# Patient Record
Sex: Female | Born: 1950 | Race: White | Hispanic: No | Smoking: Former smoker
Health system: Southern US, Community
[De-identification: ages and names within clinical notes are randomized; demographics above are authoritative.]

## PROBLEM LIST (undated history)

## (undated) ENCOUNTER — Emergency Department: Discharge status: Absent without leave | Payer: Self-pay

## (undated) DIAGNOSIS — M1990 Unspecified osteoarthritis, unspecified site: Secondary | ICD-10-CM

## (undated) DIAGNOSIS — D649 Anemia, unspecified: Secondary | ICD-10-CM

## (undated) DIAGNOSIS — D75839 Thrombocytosis, unspecified: Secondary | ICD-10-CM

## (undated) DIAGNOSIS — Z8719 Personal history of other diseases of the digestive system: Secondary | ICD-10-CM

## (undated) DIAGNOSIS — Z87442 Personal history of urinary calculi: Secondary | ICD-10-CM

## (undated) DIAGNOSIS — E669 Obesity, unspecified: Secondary | ICD-10-CM

## (undated) DIAGNOSIS — K219 Gastro-esophageal reflux disease without esophagitis: Secondary | ICD-10-CM

## (undated) DIAGNOSIS — E559 Vitamin D deficiency, unspecified: Secondary | ICD-10-CM

## (undated) DIAGNOSIS — I10 Essential (primary) hypertension: Secondary | ICD-10-CM

## (undated) DIAGNOSIS — T4145XA Adverse effect of unspecified anesthetic, initial encounter: Secondary | ICD-10-CM

## (undated) DIAGNOSIS — N3281 Overactive bladder: Secondary | ICD-10-CM

## (undated) DIAGNOSIS — D473 Essential (hemorrhagic) thrombocythemia: Secondary | ICD-10-CM

## (undated) DIAGNOSIS — J449 Chronic obstructive pulmonary disease, unspecified: Secondary | ICD-10-CM

## (undated) DIAGNOSIS — R319 Hematuria, unspecified: Secondary | ICD-10-CM

## (undated) DIAGNOSIS — F419 Anxiety disorder, unspecified: Secondary | ICD-10-CM

## (undated) DIAGNOSIS — M810 Age-related osteoporosis without current pathological fracture: Secondary | ICD-10-CM

## (undated) DIAGNOSIS — G4700 Insomnia, unspecified: Secondary | ICD-10-CM

## (undated) DIAGNOSIS — J45909 Unspecified asthma, uncomplicated: Secondary | ICD-10-CM

## (undated) DIAGNOSIS — T8859XA Other complications of anesthesia, initial encounter: Secondary | ICD-10-CM

## (undated) DIAGNOSIS — M797 Fibromyalgia: Secondary | ICD-10-CM

## (undated) DIAGNOSIS — F32A Depression, unspecified: Secondary | ICD-10-CM

## (undated) DIAGNOSIS — H269 Unspecified cataract: Secondary | ICD-10-CM

## (undated) DIAGNOSIS — F329 Major depressive disorder, single episode, unspecified: Secondary | ICD-10-CM

## (undated) DIAGNOSIS — E785 Hyperlipidemia, unspecified: Secondary | ICD-10-CM

## (undated) HISTORY — DX: Essential (primary) hypertension: I10

## (undated) HISTORY — PX: FOOT SURGERY: SHX648

## (undated) HISTORY — PX: SHOULDER SURGERY: SHX246

## (undated) HISTORY — DX: Unspecified cataract: H26.9

## (undated) HISTORY — PX: CHOLECYSTECTOMY: SHX55

## (undated) HISTORY — DX: Overactive bladder: N32.81

## (undated) HISTORY — PX: KNEE SURGERY: SHX244

## (undated) HISTORY — PX: DENTAL SURGERY: SHX609

## (undated) HISTORY — DX: Gastro-esophageal reflux disease without esophagitis: K21.9

## (undated) HISTORY — PX: OTHER SURGICAL HISTORY: SHX169

## (undated) HISTORY — DX: Hematuria, unspecified: R31.9

## (undated) HISTORY — DX: Obesity, unspecified: E66.9

## (undated) HISTORY — DX: Vitamin D deficiency, unspecified: E55.9

## (undated) HISTORY — DX: Thrombocytosis, unspecified: D75.839

---
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
## 2001-12-24 ENCOUNTER — Encounter: Payer: Self-pay | Admitting: Family Medicine

## 2001-12-24 ENCOUNTER — Encounter: Admission: RE | Admit: 2001-12-24 | Discharge: 2001-12-24 | Payer: Self-pay | Admitting: Family Medicine

## 2010-02-02 ENCOUNTER — Emergency Department (HOSPITAL_COMMUNITY): Admission: EM | Admit: 2010-02-02 | Discharge: 2010-02-02 | Payer: Self-pay | Admitting: Emergency Medicine

## 2010-05-02 ENCOUNTER — Encounter: Admission: RE | Admit: 2010-05-02 | Discharge: 2010-05-21 | Payer: Self-pay | Admitting: Orthopedic Surgery

## 2011-04-19 ENCOUNTER — Inpatient Hospital Stay (INDEPENDENT_AMBULATORY_CARE_PROVIDER_SITE_OTHER)
Admission: RE | Admit: 2011-04-19 | Discharge: 2011-04-19 | Disposition: A | Discharge status: Home / Self Care | Payer: Medicare Other | Source: Ambulatory Visit | Attending: Family Medicine | Admitting: Family Medicine

## 2011-04-19 ENCOUNTER — Encounter: Payer: Self-pay | Admitting: Family Medicine

## 2011-04-19 DIAGNOSIS — B86 Scabies: Secondary | ICD-10-CM | POA: Insufficient documentation

## 2011-04-19 DIAGNOSIS — F418 Other specified anxiety disorders: Secondary | ICD-10-CM | POA: Insufficient documentation

## 2011-05-16 ENCOUNTER — Telehealth (INDEPENDENT_AMBULATORY_CARE_PROVIDER_SITE_OTHER): Payer: Self-pay | Admitting: *Deleted

## 2011-05-16 ENCOUNTER — Inpatient Hospital Stay: Admission: RE | Admit: 2011-05-16 | Payer: Medicare Other | Source: Ambulatory Visit | Admitting: Family Medicine

## 2011-07-21 NOTE — Telephone Encounter (Signed)
  Phone Note Call from Patient   Summary of Call: pt came by the clinic today c/o rash again. explained to the pt per dr Cathren Harsh that she needs to follow up with dermatologist. sch'ed an appt with dr Terri Piedra for 05/19/11 @ 12N. pt notified of the appt. advised her if no improvement after visit with dr Terri Piedra dr Cathren Harsh reccomends that she see dermatology @ [facility]. given map and phone number to [facility] derm. Initial call taken by: Clemens Catholic LPN,  May 16, 2011 12:24 PM

## 2011-07-21 NOTE — Progress Notes (Signed)
Summary: SCABIES (room 2)   Vital Signs:  Patient Profile:   60 Years Old Female CC:      scabies Height:     61 inches (154.94 cm) Weight:      172 pounds (78.18 kg) O2 Sat:      95 % O2 treatment:    Room Air Temp:     98.4 degrees F (36.89 degrees C) oral Pulse rate:   74 / minute Resp:     16 per minute BP sitting:   127 / 83  (left arm) Cuff size:   regular  Pt. in pain?   no  Vitals Entered By: Lavell Islam RN (April 19, 2011 12:32 PM)                   Current Allergies: No known allergies History of Present Illness Chief Complaint: scabies History of Present Illness:  Subjective:  Patient complains of recurrent scabies.  Her sister has similar symptoms, although they do not live together.  She was recently treated with a partial dose of Ivermectin, and symptoms improved but recurred.  REVIEW OF SYSTEMS Constitutional Symptoms      Denies fever, chills, night sweats, weight loss, weight gain, and fatigue.  Eyes       Denies change in vision, eye pain, eye discharge, glasses, contact lenses, and eye surgery. Ear/Nose/Throat/Mouth       Denies hearing loss/aids, change in hearing, ear pain, ear discharge, dizziness, frequent runny nose, frequent nose bleeds, sinus problems, sore throat, hoarseness, and tooth pain or bleeding.  Respiratory       Denies dry cough, productive cough, wheezing, shortness of breath, asthma, bronchitis, and emphysema/COPD.  Cardiovascular       Denies murmurs, chest pain, and tires easily with exhertion.    Gastrointestinal       Denies stomach pain, nausea/vomiting, diarrhea, constipation, blood in bowel movements, and indigestion. Genitourniary       Denies painful urination, kidney stones, and loss of urinary control. Neurological       Denies paralysis, seizures, and fainting/blackouts. Musculoskeletal       Denies muscle pain, joint pain, joint stiffness, decreased range of motion, redness, swelling, muscle weakness, and  gout.  Skin       Denies bruising, unusual mles/lumps or sores, and hair/skin or nail changes.  Psych       Denies mood changes, temper/anger issues, anxiety/stress, speech problems, depression, and sleep problems. Other Comments: Scabies   Past History:  Past Medical History: Anxiety  Past Surgical History: Caesarean section x 2 Knee surgeries x 2 Shoulder surgeries x 2 foot surgery  Family History: Family History Diabetes 1st degree relative Family History Hypertension  Social History: Never Smoked Alcohol use-no Drug use-no Smoking Status:  never Drug Use:  no   Objective:  Appearance:  Patient appears healthy, stated age, and in no acute distress  Skin:  scattered maculo-papular erythematous excoriations on trunk and extremities.  Lesions do not appear infected. Assessment New Problems: SCABIES (ICD-133.0) FAMILY HISTORY DIABETES 1ST DEGREE RELATIVE (ICD-V18.0) ANXIETY (ICD-300.00)   Plan New Medications/Changes: STROMECTOL 3 MG TABS (IVERMECTIN) Take 6 tabs by mouth as a single dose.  Take on empty stomach with plenty of water.  Repeat in two weeks  #12 x 0, 04/19/2011, Donna Christen MD  New Orders: New Patient Level III [phone] Services provided After hours-Weekends-Holidays [99051] Planning Comments:   Treat with Ivermectin as a single dose.  Repeat in 2 weeks.  May  continue triamcinolone cream to individual lesions.  Wash all clothes and bedsheets.  Will treat sister also.  Follow-up with dermatologist.   The patient and/or caregiver has been counseled thoroughly with regard to medications prescribed including dosage, schedule, interactions, rationale for use, and possible side effects and they verbalize understanding.  Diagnoses and expected course of recovery discussed and will return if not improved as expected or if the condition worsens. Patient and/or caregiver verbalized understanding.  Prescriptions: STROMECTOL 3 MG TABS (IVERMECTIN) Take 6 tabs  by mouth as a single dose.  Take on empty stomach with plenty of water.  Repeat in two weeks  #12 x 0   Entered and Authorized by:   Donna Christen MD   Signed by:   Donna Christen MD on 04/19/2011   Method used:   Print then Give to Patient   RxID:   [id number]   Orders Added: 1)  New Patient Level III [14782] 2)  Services provided After hours-Weekends-Holidays [95621]

## 2013-02-14 ENCOUNTER — Encounter: Payer: Self-pay | Admitting: Emergency Medicine

## 2013-02-14 ENCOUNTER — Emergency Department (INDEPENDENT_AMBULATORY_CARE_PROVIDER_SITE_OTHER)
Admission: EM | Admit: 2013-02-14 | Discharge: 2013-02-14 | Disposition: A | Discharge status: Home / Self Care | Payer: Medicare Other | Source: Home / Self Care | Attending: Family Medicine | Admitting: Family Medicine

## 2013-02-14 DIAGNOSIS — R319 Hematuria, unspecified: Secondary | ICD-10-CM

## 2013-02-14 DIAGNOSIS — R300 Dysuria: Secondary | ICD-10-CM

## 2013-02-14 DIAGNOSIS — L259 Unspecified contact dermatitis, unspecified cause: Secondary | ICD-10-CM

## 2013-02-14 LAB — POCT URINALYSIS DIPSTICK
Bilirubin, UA: NEGATIVE
Glucose, UA: NEGATIVE
Ketones, UA: NEGATIVE
Leukocytes, UA: NEGATIVE
Nitrite, UA: NEGATIVE
Protein, UA: NEGATIVE
Spec Grav, UA: 1.015 (ref 1.005–1.03)
Urobilinogen, UA: 0.2 (ref 0–1)
pH, UA: 6 (ref 5–8)

## 2013-02-14 MED ORDER — TRIAMCINOLONE ACETONIDE 40 MG/ML IJ SUSP
40.0000 mg | Freq: Once | INTRAMUSCULAR | Status: AC
  Administered 2013-02-14: 40 mg via INTRAMUSCULAR

## 2013-02-14 NOTE — ED Notes (Signed)
Red itchy rash on abdomen, hips, rt leg x 1 week

## 2013-02-14 NOTE — Discharge Instructions (Signed)
Take Benadryl 25mg , two caps at bedtime for itching.   Hematuria, Adult Hematuria (blood in your urine) can be caused by a bladder infection (cystitis), kidney infection (pyelonephritis), prostate infection (prostatitis), or kidney stone. Infections will usually respond to antibiotics (medications which kill germs), and a kidney stone will usually pass through your urine without further treatment. If you were put on antibiotics, take all the medicine until gone. You may feel better in a few days, but take all of your medicine or the infection may not respond and become more difficult to treat. If antibiotics were not given, an infection did not cause the blood in the urine. A further work up to find out the reason may be needed. HOME CARE INSTRUCTIONS   Drink lots of fluid, 3 to 4 quarts a day. If you have been diagnosed with an infection, cranberry juice is especially recommended, in addition to large amounts of water.  Avoid caffeine, tea, and carbonated beverages, because they tend to irritate the bladder.  Avoid alcohol as it may irritate the prostate.  Only take over-the-counter or prescription medicines for pain, discomfort, or fever as directed by your caregiver.  If you have been diagnosed with a kidney stone follow your caregivers instructions regarding straining your urine to catch the stone. TO PREVENT FURTHER INFECTIONS:  Empty the bladder often. Avoid holding urine for long periods of time.  After a bowel movement, women should cleanse front to back. Use each tissue only once.  Empty the bladder before and after sexual intercourse if you are a female.  Return to your caregiver if you develop back pain, fever, nausea (feeling sick to your stomach), vomiting, or your symptoms (problems) are not better in 3 days. Return sooner if you are getting worse. If you have been requested to return for further testing make sure to keep your appointments. If an infection is not the cause of  blood in your urine, X-rays may be required. Your caregiver will discuss this with you. SEEK IMMEDIATE MEDICAL CARE IF:   You have a persistent fever over 102 F (38.9 C).  You develop severe vomiting and are unable to keep the medication down.  You develop severe back or abdominal pain despite taking your medications.  You begin passing a large amount of blood or clots in your urine.  You feel extremely weak or faint, or pass out. MAKE SURE YOU:   Understand these instructions.  Will watch your condition.  Will get help right away if you are not doing well or get worse. Document Released: 08/04/2005 Document Revised: 10/27/2011 Document Reviewed: 03/23/2008 [facility] Patient Information 2014 [place], [place].

## 2013-02-14 NOTE — ED Provider Notes (Signed)
History    CSN: [id number] Arrival date & time 02/14/13  1029  First MD Initiated Contact with Patient 02/14/13 1102     Chief Complaint  Patient presents with  . Rash      HPI Comments: Patient has been mowing in her yard and has contacted poison oak.  She has scattered rash on trunk and extremities.  Itching is worse at night. She complains of a history of hematuria.  She denies dysuria, frequency, or urgency.  She states that she has a history of kidney stones.  No abdominal or flank pain.  No fevers, chills, and sweats. She states that she has a history of high cholesterol, but has not taken medication for several months.   Patient is a 62 y.o. female presenting with rash. The history is provided by the patient.  Rash Pain location: trunk and extremities. Pain quality comment:  Itching Pain severity:  Mild Onset quality:  Gradual Duration:  1 week Timing:  Constant Progression:  Unchanged Chronicity:  New Context comment:  Working outside Relieved by:  Nothing Exacerbated by: sweating. Ineffective treatments: calamine lotion. Associated symptoms: hematuria   Associated symptoms: no chills, no dysuria, no fever and no nausea    History reviewed. No pertinent past medical history. History reviewed. No pertinent past surgical history. No family history on file. History  Substance Use Topics  . Smoking status: Current Some Day Smoker -- 40 years    Types: Cigarettes  . Smokeless tobacco: Not on file  . Alcohol Use: No   OB History   Grav Para Term Preterm Abortions TAB SAB Ect Mult Living                 Review of Systems  Constitutional: Negative for fever and chills.  Gastrointestinal: Negative for nausea.  Genitourinary: Positive for hematuria. Negative for dysuria.  Skin: Positive for rash.  All other systems reviewed and are negative.    Allergies  Review of patient's allergies indicates no known allergies.  Home Medications   Current Outpatient Rx    Name  Route  Sig  Dispense  Refill  . sertraline (ZOLOFT) 100 MG tablet   Oral   Take 100 mg by mouth daily.         Marland Kitchen zolpidem (AMBIEN) 10 MG tablet   Oral   Take 10 mg by mouth at bedtime as needed for sleep.          BP 152/80  Pulse 73  Temp(Src) 97.7 F (36.5 C) (Oral)  Ht 5\' 1"  (1.549 m)  Wt 186 lb (84.369 kg)  BMI 35.16 kg/m2  SpO2 96% Physical Exam  Nursing note and vitals reviewed. Constitutional: She is oriented to person, place, and time. She appears well-developed and well-nourished. No distress.  Patient is obese (BMI 35.2)  HENT:  Head: Normocephalic.  Eyes: Conjunctivae are normal. Pupils are equal, round, and reactive to light.  Cardiovascular: Normal heart sounds.   Pulmonary/Chest: Breath sounds normal.  Abdominal: There is no tenderness.  Musculoskeletal: She exhibits no edema.  Neurological: She is alert and oriented to person, place, and time.  Skin: Skin is warm and dry. Rash noted.     Skin reveals widely scattered areas of patchy erythema.  No vesicles or swelling/tenderness.    ED Course  Procedures  none  Labs Reviewed  URINE CULTURE pending  POCT URINALYSIS DIPSTICK:  BLO moderate, otherwise negative      1. Dysuria   2. Contact dermatitis  3. Hematuria     MDM  Kenalog 40mg  IM Take Benadryl 25mg , two caps at bedtime for itching. Will culture urine.  Followup with Family Doctor for hematuria, hyperlipidemia, etc.   Lattie Haw, MD 02/14/13 1149

## 2013-02-15 LAB — URINE CULTURE
Colony Count: NO GROWTH
Organism ID, Bacteria: NO GROWTH

## 2013-02-16 ENCOUNTER — Telehealth: Payer: Self-pay

## 2013-02-16 NOTE — ED Notes (Signed)
Larry states she is ok. I advised her of the urine culture and to call back if needed.

## 2013-09-28 ENCOUNTER — Emergency Department (INDEPENDENT_AMBULATORY_CARE_PROVIDER_SITE_OTHER): Payer: Medicare Other

## 2013-09-28 ENCOUNTER — Emergency Department
Admission: EM | Admit: 2013-09-28 | Discharge: 2013-09-28 | Disposition: A | Discharge status: Home / Self Care | Payer: Medicare Other | Source: Home / Self Care | Attending: Family Medicine | Admitting: Family Medicine

## 2013-09-28 ENCOUNTER — Emergency Department: Payer: Medicare Other

## 2013-09-28 ENCOUNTER — Encounter: Payer: Self-pay | Admitting: Emergency Medicine

## 2013-09-28 DIAGNOSIS — R609 Edema, unspecified: Secondary | ICD-10-CM

## 2013-09-28 DIAGNOSIS — K120 Recurrent oral aphthae: Secondary | ICD-10-CM

## 2013-09-28 DIAGNOSIS — M7989 Other specified soft tissue disorders: Secondary | ICD-10-CM

## 2013-09-28 HISTORY — DX: Major depressive disorder, single episode, unspecified: F32.9

## 2013-09-28 HISTORY — DX: Depression, unspecified: F32.A

## 2013-09-28 HISTORY — DX: Insomnia, unspecified: G47.00

## 2013-09-28 HISTORY — DX: Fibromyalgia: M79.7

## 2013-09-28 MED ORDER — CYCLOBENZAPRINE HCL 5 MG PO TABS: ORAL_TABLET | ORAL | Status: DC

## 2013-09-28 NOTE — Discharge Instructions (Signed)
Wear support hose daytime; take off at bedtime.   Edema Edema is an abnormal build-up of fluids in tissues. Because this is partly dependent on gravity (water flows to the lowest place), it is more common in the legs and thighs (lower extremities). It is also common in the looser tissues, like around the eyes. Painless swelling of the feet and ankles is common and increases as a person ages. It may affect both legs and may include the calves or even thighs. When squeezed, the fluid may move out of the affected area and may leave a dent for a few moments. CAUSES   Prolonged standing or sitting in one place for extended periods of time. Movement helps pump tissue fluid into the veins, and absence of movement prevents this, resulting in edema.  Varicose veins. The valves in the veins do not work as well as they should. This causes fluid to leak into the tissues.  Fluid and salt overload.  Injury, burn, or surgery to the leg, ankle, or foot, may damage veins and allow fluid to leak out.  Sunburn damages vessels. Leaky vessels allow fluid to go out into the sunburned tissues.  Allergies (from insect bites or stings, medications or chemicals) cause swelling by allowing vessels to become leaky.  Protein in the blood helps keep fluid in your vessels. Low protein, as in malnutrition, allows fluid to leak out.  Hormonal changes, including pregnancy and menstruation, cause fluid retention. This fluid may leak out of vessels and cause edema.  Medications that cause fluid retention. Examples are sex hormones, blood pressure medications, steroid treatment, or anti-depressants.  Some illnesses cause edema, especially heart failure, kidney disease, or liver disease.  Surgery that cuts veins or lymph nodes, such as surgery done for the heart or for breast cancer, may result in edema. DIAGNOSIS  Your caregiver is usually easily able to determine what is causing your swelling (edema) by simply asking what  is wrong (getting a history) and examining you (doing a physical). Sometimes x-rays, EKG (electrocardiogram or heart tracing), and blood work may be done to evaluate for underlying medical illness. TREATMENT  General treatment includes:  Leg elevation (or elevation of the affected body part).  Restriction of fluid intake.  Prevention of fluid overload.  Compression of the affected body part. Compression with elastic bandages or support stockings squeezes the tissues, preventing fluid from entering and forcing it back into the blood vessels.  Diuretics (also called water pills or fluid pills) pull fluid out of your body in the form of increased urination. These are effective in reducing the swelling, but can have side effects and must be used only under your caregiver's supervision. Diuretics are appropriate only for some types of edema. The specific treatment can be directed at any underlying causes discovered. Heart, liver, or kidney disease should be treated appropriately. HOME CARE INSTRUCTIONS   Elevate the legs (or affected body part) above the level of the heart, while lying down.  Avoid sitting or standing still for prolonged periods of time.  Avoid putting anything directly under the knees when lying down, and do not wear constricting clothing or garters on the upper legs.  Exercising the legs causes the fluid to work back into the veins and lymphatic channels. This may help the swelling go down.  The pressure applied by elastic bandages or support stockings can help reduce ankle swelling.  A low-salt diet may help reduce fluid retention and decrease the ankle swelling.  Take any medications exactly  as prescribed. SEEK MEDICAL CARE IF:  Your edema is not responding to recommended treatments. SEEK IMMEDIATE MEDICAL CARE IF:   You develop shortness of breath or chest pain.  You cannot breathe when you lay down; or if, while lying down, you have to get up and go to the window  to get your breath.  You are having increasing swelling without relief from treatment.  You develop a fever over 102 F (38.9 C).  You develop pain or redness in the areas that are swollen.  Tell your caregiver right away if you have gained 03 lb/1.4 kg in 1 day or 05 lb/2.3 kg in a week. MAKE SURE YOU:   Understand these instructions.  Will watch your condition.  Will get help right away if you are not doing well or get worse. Document Released: 08/04/2005 Document Revised: 02/03/2012 Document Reviewed: 03/22/2008 [facility] Patient Information 2014 [place].

## 2013-09-28 NOTE — ED Notes (Signed)
Pitting edema left ankle x 3 weeks worse in the last week, painful to the touch

## 2013-09-28 NOTE — ED Provider Notes (Signed)
CSN: [id number]     Arrival date & time 09/28/13  1101 History   First MD Initiated Contact with Patient 09/28/13 1121     Chief Complaint  Patient presents with  . Leg Swelling        HPI Comments: Patient presents with 2 problems: 1)  She complains of pain in her left lower leg above the ankle for 3 weeks.  No history of injury.  She has had mild bilateral lower leg edema that is worse in the evenings and slightly worse upon arising each morning.  The left leg is more pronounced than the right.  No chest pain or shortness of breath.  No fevers, chills, and sweats  2)  She noticed swelling and pain in her right tongue about two weeks ago that is gradually improving.  Patient is a 63 y.o. female presenting with leg pain. The history is provided by the patient.  Leg Pain Location:  Leg Time since incident:  3 weeks Injury: no   Leg location:  L leg Pain details:    Quality:  Aching   Radiates to:  Does not radiate   Severity:  Mild   Onset quality:  Gradual   Duration:  3 weeks   Timing:  Constant   Progression:  Worsening Chronicity:  New Prior injury to area:  No Relieved by:  Nothing Worsened by:  Bearing weight Ineffective treatments:  None tried Associated symptoms: swelling   Associated symptoms: no decreased ROM, no fatigue, no fever, no itching, no muscle weakness, no numbness, no stiffness and no tingling   Risk factors: obesity     Past Medical History  Diagnosis Date  . Depression   . Insomnia   . Fibromyalgia    Past Surgical History  Procedure Laterality Date  . Knee surgery    . C sections    . Cholecystectomy    . Foot surgery    . Shoulder surgery     Family History  Problem Relation Age of Onset  . Hyperlipidemia Mother   . Hypertension Mother   . Heart attack Mother   . Cancer Father    History  Substance Use Topics  . Smoking status: Current Some Day Smoker -- 40 years    Types: Cigarettes  . Smokeless tobacco: Not on file  . Alcohol  Use: No   OB History   Grav Para Term Preterm Abortions TAB SAB Ect Mult Living                 Review of Systems  Constitutional: Negative for fever and fatigue.  HENT: Negative for congestion, facial swelling, mouth sores, postnasal drip, rhinorrhea, sore throat and trouble swallowing.   Respiratory: Negative for chest tightness, shortness of breath and wheezing.   Cardiovascular: Negative for chest pain.  Musculoskeletal: Negative for stiffness.  Skin: Negative for itching.  All other systems reviewed and are negative.      Allergies  Codeine and Sulfa antibiotics  Home Medications   Current Outpatient Rx  Name  Route  Sig  Dispense  Refill  . cyclobenzaprine (FLEXERIL) 5 MG tablet      Take one at bedtime prn   15 tablet   0   . sertraline (ZOLOFT) 100 MG tablet   Oral   Take 100 mg by mouth daily.         Marland Kitchen zolpidem (AMBIEN) 10 MG tablet   Oral   Take 10 mg by mouth at bedtime as needed for  sleep.          BP 124/76  Pulse 88  Temp(Src) 98 F (36.7 C) (Oral)  Ht 5' (1.524 m)  Wt 191 lb (86.637 kg)  BMI 37.30 kg/m2  SpO2 95% Physical Exam Nursing notes and Vital Signs reviewed. Appearance:  Patient appears stated age, and in no acute distress.  Patient is obese (BMI 37.3) Eyes:  Pupils are equal, round, and reactive to light and accomodation.  Extraocular movement is intact.  Conjunctivae are not inflamed  Mouth:  22mm dia shallow apthous ulcer right inferior surface of tongue. Pharynx:  Normal Neck:  Supple.  Slightly tender shotty right sub-mandibular node. Lungs:  Clear to auscultation.  Breath sounds are equal.  Heart:  Regular rate and rhythm without murmurs, rubs, or gallops.  Abdomen:  Nontender without masses or hepatosplenomegaly.  Bowel sounds are present.  No CVA or flank tenderness.  Extremities:  Left lower leg has mild tenderness and trace edema above the ankle.  Tenderness extends to posterior left lower leg.  No erythema or warmth.   Homan's test is negative.  Pedal pulses are intact. Skin:  No rash present.   ED Course  Procedures  none     Imaging Review Dg Tibia/fibula Left  09/28/2013   CLINICAL DATA:  Three-week history of lower leg pain and edema. No known injury.  EXAM: LEFT TIBIA AND FIBULA - 2 VIEW  COMPARISON:  None.  FINDINGS: There is diffuse lower leg subcutaneous edema, especially distally. No foreign body or soft tissue emphysema is identified. There is no evidence of acute fracture, dislocation or bone destruction. Mild degenerative changes are noted at the knee.  IMPRESSION: Nonspecific subcutaneous edema.  No acute osseous findings.   Electronically Signed   By: Camie Patience M.D.   On: 09/28/2013 13:32   US Venous Img Lower Unilateral Left  09/28/2013   CLINICAL DATA:  Left lower leg swelling  EXAM: Left LOWER EXTREMITY VENOUS DOPPLER ULTRASOUND  TECHNIQUE: Gray-scale sonography with graded compression, as well as color Doppler and duplex ultrasound, were performed to evaluate the deep venous system from the level of the common femoral vein through the popliteal and proximal calf veins. Spectral Doppler was utilized to evaluate flow at rest and with distal augmentation maneuvers.  COMPARISON:  None.  FINDINGS: Visualized left lower extremity deep venous system appears patent.  Normal compressibility. Patent color Doppler flow. Satisfactory spectral Doppler with respiratory variation and response to augmentation.  Greater saphenous vein, where visualized, is patent and compressible.  Mild subcutaneous edema at the ankle.  IMPRESSION: No deep venous thrombosis in the visualized left lower extremity.   Electronically Signed   By: Julian Hy M.D.   On: 09/28/2013 12:54      MDM   Final diagnoses:  Dependent edema  Aphthous ulcer of tongue    Rx written for mild compression below knee support hose (8-71mm HG) Wear support hose daytime.  Take off at bedtime. At patient's request, Rx written for  Flexeril 5mg  at bedtime prn. Followup with Family Doctor for management.        Kandra Nicolas, MD 09/28/13 [phone]

## 2013-10-01 ENCOUNTER — Telehealth: Payer: Self-pay

## 2013-10-01 NOTE — ED Notes (Signed)
I called and spoke with patient and she is doing better. I advised to call back if anything changes or if she has questions or concerns.  

## 2013-11-17 ENCOUNTER — Encounter: Payer: Self-pay | Admitting: Emergency Medicine

## 2013-11-17 ENCOUNTER — Emergency Department
Admission: EM | Admit: 2013-11-17 | Discharge: 2013-11-17 | Disposition: A | Discharge status: Home / Self Care | Payer: Medicare Other | Source: Home / Self Care | Attending: Emergency Medicine | Admitting: Emergency Medicine

## 2013-11-17 DIAGNOSIS — J209 Acute bronchitis, unspecified: Secondary | ICD-10-CM

## 2013-11-17 MED ORDER — PREDNISONE (PAK) 10 MG PO TABS: ORAL_TABLET | ORAL | Status: DC

## 2013-11-17 MED ORDER — CEFTRIAXONE SODIUM 1 G IJ SOLR
1.0000 g | INTRAMUSCULAR | Status: AC
  Administered 2013-11-17: 1 g via INTRAMUSCULAR

## 2013-11-17 MED ORDER — AZITHROMYCIN 250 MG PO TABS: ORAL_TABLET | ORAL | Status: DC

## 2013-11-17 NOTE — ED Provider Notes (Signed)
CSN: [id number]     Arrival date & time 11/17/13  1333 History   First MD Initiated Contact with Patient 11/17/13 1410     Chief Complaint  Patient presents with  . Cough   (Consider location/radiation/quality/duration/timing/severity/associated sxs/prior Treatment) HPI URI HISTORY  Shelley Perez is a 63 y.o. female who complains of onset of URI symptoms for 5 days.  Have been using over-the-counter treatment, such as Mucinex, which helps a little bit. In the past 3 days, worsening productive cough of thick green sputum, chest congestion, minimal late expiratory wheezes but no definite shortness of breath  No chills/sweats +  Fever  +  Nasal congestion +  Mild Discolored Post-nasal drainage No sinus pain/pressure Mild sore throat  +  Productive cough Mild wheezing Mild chest congestion No hemoptysis No shortness of breath No pleuritic pain No exertional chest pain  No itchy/red eyes No earache  No nausea No vomiting No abdominal pain No diarrhea  No skin rashes +  Fatigue No myalgias No headache  No lightheadedness or syncope or focal neurologic symptoms  Past Medical History  Diagnosis Date  . Depression   . Insomnia   . Fibromyalgia    Past Surgical History  Procedure Laterality Date  . Knee surgery    . C sections    . Cholecystectomy    . Foot surgery    . Shoulder surgery     Family History  Problem Relation Age of Onset  . Hyperlipidemia Mother   . Hypertension Mother   . Heart attack Mother   . Cancer Father    History  Substance Use Topics  . Smoking status: Current Some Day Smoker -- 40 years    Types: Cigarettes  . Smokeless tobacco: Not on file  . Alcohol Use: No   OB History   Grav Para Term Preterm Abortions TAB SAB Ect Mult Living                 Review of Systems  All other systems reviewed and are negative.    Allergies  Codeine and Sulfa antibiotics  Home Medications   Current Outpatient Rx  Name  Route  Sig  Dispense   Refill  . azithromycin (ZITHROMAX Z-PAK) 250 MG tablet      Take 2 tablets on day one, then 1 tablet daily on days 2 through 5   1 each   0   . cyclobenzaprine (FLEXERIL) 5 MG tablet      Take one at bedtime prn   15 tablet   0   . predniSONE (STERAPRED UNI-PAK) 10 MG tablet      Take as directed for 6 days.--Take 6 on day 1, 5 on day 2, 4 on day 3, then 3 tablets on day 4, then 2 tablets on day 5, then 1 on day 6.   21 tablet   0   . sertraline (ZOLOFT) 100 MG tablet   Oral   Take 100 mg by mouth daily.         Marland Kitchen zolpidem (AMBIEN) 10 MG tablet   Oral   Take 10 mg by mouth at bedtime as needed for sleep.          BP 143/82  Pulse 73  Temp(Src) 98 F (36.7 C) (Oral)  Ht 5\' 1"  (1.549 m)  Wt 192 lb (87.091 kg)  BMI 36.30 kg/m2  SpO2 95% Physical Exam  Nursing note and vitals reviewed. Constitutional: She is oriented to person, place, and time. She  appears well-developed and well-nourished. No distress.  HENT:  Head: Normocephalic and atraumatic.  Right Ear: Tympanic membrane normal.  Left Ear: Tympanic membrane normal.  Nose: Nose normal.  Mouth/Throat: Oropharynx is clear and moist. No oropharyngeal exudate.  Eyes: Right eye exhibits no discharge. Left eye exhibits no discharge. No scleral icterus.  Neck: Neck supple.  Cardiovascular: Normal rate, regular rhythm and normal heart sounds.   Pulmonary/Chest: Effort normal. No respiratory distress. She has no decreased breath sounds. She has wheezes (mild, late expiratory). She has rhonchi in the right upper field and the left upper field. She has no rales.  Lymphadenopathy:    She has no cervical adenopathy.  Neurological: She is alert and oriented to person, place, and time.  Skin: Skin is warm and dry. No rash noted.  Psychiatric: She has a normal mood and affect.    ED Course  Procedures (including critical care time) Labs Review Labs Reviewed - No data to display Imaging Review No results  found.   MDM   1. Bronchitis with bronchospasm    Treatment options discussed, as well as risks, benefits, alternatives. Patient voiced understanding and agreement with the following plans: She strongly requested "a shot of an antibiotic", so Rocephin 1 g IM stat given. Z-Pak Prednisone 10 mg-6 day Dosepak Other symptomatic care She declined any prescription cough med She declined any nebulizer or prescription for albuterol HFA Follow-up with your primary care doctor in 5-7 days if not improving, or sooner if symptoms become worse. Precautions discussed. Red flags discussed. Questions invited and answered. Patient voiced understanding and agreement.      Jacqulyn Cane, MD 11/17/13 [phone]

## 2013-11-17 NOTE — ED Notes (Signed)
Productive cough x 5 days, thick green sputum, congestion

## 2014-04-30 ENCOUNTER — Encounter (HOSPITAL_COMMUNITY): Payer: Self-pay | Admitting: Emergency Medicine

## 2014-04-30 ENCOUNTER — Emergency Department (HOSPITAL_COMMUNITY)
Admission: EM | Admit: 2014-04-30 | Discharge: 2014-04-30 | Disposition: A | Discharge status: Home / Self Care | Payer: Medicare Other | Attending: Emergency Medicine | Admitting: Emergency Medicine

## 2014-04-30 ENCOUNTER — Emergency Department (HOSPITAL_COMMUNITY): Payer: Medicare Other

## 2014-04-30 DIAGNOSIS — Z8659 Personal history of other mental and behavioral disorders: Secondary | ICD-10-CM | POA: Insufficient documentation

## 2014-04-30 DIAGNOSIS — R82998 Other abnormal findings in urine: Secondary | ICD-10-CM | POA: Insufficient documentation

## 2014-04-30 DIAGNOSIS — R319 Hematuria, unspecified: Secondary | ICD-10-CM | POA: Insufficient documentation

## 2014-04-30 DIAGNOSIS — M545 Low back pain, unspecified: Secondary | ICD-10-CM | POA: Insufficient documentation

## 2014-04-30 DIAGNOSIS — K5900 Constipation, unspecified: Secondary | ICD-10-CM | POA: Diagnosis present

## 2014-04-30 DIAGNOSIS — F17200 Nicotine dependence, unspecified, uncomplicated: Secondary | ICD-10-CM | POA: Insufficient documentation

## 2014-04-30 LAB — URINALYSIS, ROUTINE W REFLEX MICROSCOPIC
BILIRUBIN URINE: NEGATIVE
Glucose, UA: NEGATIVE mg/dL
Ketones, ur: NEGATIVE mg/dL
Leukocytes, UA: NEGATIVE
Nitrite: NEGATIVE
PH: 5.5 (ref 5.0–8.0)
Protein, ur: NEGATIVE mg/dL
Specific Gravity, Urine: 1.022 (ref 1.005–1.030)
Urobilinogen, UA: 0.2 mg/dL (ref 0.0–1.0)

## 2014-04-30 LAB — COMPREHENSIVE METABOLIC PANEL
ALBUMIN: 4 g/dL (ref 3.5–5.2)
ALK PHOS: 120 U/L — AB (ref 39–117)
ALT: 17 U/L (ref 0–35)
AST: 17 U/L (ref 0–37)
Anion gap: 14 (ref 5–15)
BILIRUBIN TOTAL: 0.5 mg/dL (ref 0.3–1.2)
BUN: 12 mg/dL (ref 6–23)
CHLORIDE: 100 meq/L (ref 96–112)
CO2: 26 mEq/L (ref 19–32)
Calcium: 9.1 mg/dL (ref 8.4–10.5)
Creatinine, Ser: 0.69 mg/dL (ref 0.50–1.10)
GFR calc Af Amer: >90 mL/min (ref >90)
GFR calc non Af Amer: >90 mL/min (ref >90)
Glucose, Bld: 90 mg/dL (ref 70–99)
Potassium: 4.3 mEq/L (ref 3.7–5.3)
Sodium: 140 mEq/L (ref 137–147)
Total Protein: 7.7 g/dL (ref 6.0–8.3)

## 2014-04-30 LAB — CBC WITH DIFFERENTIAL/PLATELET
BASOS ABS: 0.1 10*3/uL (ref 0.0–0.1)
BASOS PCT: 2 % — AB (ref 0–1)
Eosinophils Absolute: 0.2 10*3/uL (ref 0.0–0.7)
Eosinophils Relative: 3 % (ref 0–5)
HCT: 38.3 % (ref 36.0–46.0)
Hemoglobin: 12.9 g/dL (ref 12.0–15.0)
LYMPHS PCT: 33 % (ref 12–46)
Lymphs Abs: 2.4 10*3/uL (ref 0.7–4.0)
MCH: 29.7 pg (ref 26.0–34.0)
MCHC: 33.7 g/dL (ref 30.0–36.0)
MCV: 88 fL (ref 78.0–100.0)
Monocytes Absolute: 0.7 10*3/uL (ref 0.1–1.0)
Monocytes Relative: 10 % (ref 3–12)
NEUTROS PCT: 52 % (ref 43–77)
Neutro Abs: 3.7 10*3/uL (ref 1.7–7.7)
Platelets: 437 10*3/uL — ABNORMAL HIGH (ref 150–400)
RBC: 4.35 MIL/uL (ref 3.87–5.11)
RDW: 13.2 % (ref 11.5–15.5)
WBC: 7.1 10*3/uL (ref 4.0–10.5)

## 2014-04-30 LAB — URINE MICROSCOPIC-ADD ON

## 2014-04-30 LAB — LIPASE, BLOOD: Lipase: 22 U/L (ref 11–59)

## 2014-04-30 MED ORDER — CEPHALEXIN 500 MG PO CAPS: 500.0000 mg | ORAL_CAPSULE | Freq: Three times a day (TID) | ORAL | Status: DC

## 2014-04-30 NOTE — ED Notes (Signed)
Dr Beaton at bedside 

## 2014-04-30 NOTE — ED Notes (Signed)
Patient transported to X-ray 

## 2014-04-30 NOTE — ED Provider Notes (Signed)
CSN: [id number]     Arrival date & time 04/30/14  1608 History   First MD Initiated Contact with Patient 04/30/14 1821     Chief Complaint  Patient presents with  . Constipation      HPI The pt has been unable tom have a bm for a long time. The pt cannot remember when. Today she has had laxatives x2 and she has not been able To have a bm since 1500 yesterday. Denies fever or vomiting.  Did take laxative and have watery BM yesterday.  She is passing "gas".She also has had "strong smelling" urine.  Some mild low back pain.  Past Medical History  Diagnosis Date  . Depression   . Insomnia   . Fibromyalgia    Past Surgical History  Procedure Laterality Date  . Knee surgery    . C sections    . Cholecystectomy    . Foot surgery    . Shoulder surgery     Family History  Problem Relation Age of Onset  . Hyperlipidemia Mother   . Hypertension Mother   . Heart attack Mother   . Cancer Father    History  Substance Use Topics  . Smoking status: Current Some Day Smoker -- 40 years    Types: Cigarettes  . Smokeless tobacco: Not on file  . Alcohol Use: No   OB History   Grav Para Term Preterm Abortions TAB SAB Ect Mult Living                 Review of Systems  All other systems reviewed and are negative  Allergies  Codeine; Prozac; Tylox; and Sulfa antibiotics  Home Medications   Prior to Admission medications   Medication Sig Start Date End Date Taking? Authorizing Provider  castor oil liquid Take 60 mLs by mouth once.   Yes Historical Provider, MD  magnesium citrate SOLN Take 1 Bottle by mouth once.   Yes Historical Provider, MD  cephALEXin (KEFLEX) 500 MG capsule Take 1 capsule (500 mg total) by mouth 3 (three) times daily. 04/30/14   Dot Lanes, MD   BP 114/47  Pulse 79  Temp(Src) 98 F (36.7 C) (Oral)  Resp 20  Ht 5\' 1"  (1.549 m)  Wt 192 lb (87.091 kg)  BMI 36.30 kg/m2  SpO2 98% Physical Exam Physical Exam  Nursing note and vitals  reviewed. Constitutional: She is oriented to person, place, and time. She appears well-developed and well-nourished. No distress.  HENT:  Head: Normocephalic and atraumatic.  Eyes: Pupils are equal, round, and reactive to light.  Neck: Normal range of motion.  Cardiovascular: Normal rate and intact distal pulses.   Pulmonary/Chest: No respiratory distress.  Abdominal: Normal appearance. She exhibits no distension.  active bowel sounds no rebound or guarding tenderness Musculoskeletal: Normal range of motion.  Neurological: She is alert and oriented to person, place, and time. No cranial nerve deficit.  Skin: Skin is warm and dry. No rash noted.  Psychiatric: She has a normal mood and affect. Her behavior is normal.   ED Course  Procedures (including critical care time) Labs Review Labs Reviewed  CBC WITH DIFFERENTIAL - Abnormal; Notable for the following:    Platelets 437 (*)    Basophils Relative 2 (*)    All other components within normal limits  COMPREHENSIVE METABOLIC PANEL - Abnormal; Notable for the following:    Alkaline Phosphatase 120 (*)    All other components within normal limits  URINALYSIS, ROUTINE W REFLEX  MICROSCOPIC - Abnormal; Notable for the following:    APPearance CLOUDY (*)    Hgb urine dipstick MODERATE (*)    All other components within normal limits  URINE MICROSCOPIC-ADD ON - Abnormal; Notable for the following:    Bacteria, UA FEW (*)    Crystals CA OXALATE CRYSTALS (*)    All other components within normal limits  URINE CULTURE  LIPASE, BLOOD    Imaging Review Dg Abd 1 View  04/30/2014   CLINICAL DATA:  Abdominal pain and constipation.  EXAM: ABDOMEN - 1 VIEW  COMPARISON:  None.  FINDINGS: There is a small amount of air scattered throughout nondistended loops of large and small bowel. There is no significant stool in the colon. No fecal impaction.  Multiple surgical clips from previous cholecystectomy. Extensive calcification in the splenic artery.   Phleboliths in the pelvis.  No osseous abnormality.  IMPRESSION: Benign appearing abdomen. Specifically, no excessive stool in the colon.   Electronically Signed   By: Rozetta Nunnery M.D.   On: 04/30/2014 20:17    Discussed increasing dietary fiber and other diet changes.  Patient appears stable for discharge.  She is going to arrange follow up appointment with her PMD.  MDM   Final diagnoses:  Hematuria        Dot Lanes, MD 05/01/14 1146

## 2014-04-30 NOTE — Discharge Instructions (Signed)
Hematuria Hematuria is blood in your urine. It can be caused by a bladder infection, kidney infection, prostate infection, kidney stone, or cancer of your urinary tract. Infections can usually be treated with medicine, and a kidney stone usually will pass through your urine. If neither of these is the cause of your hematuria, further workup to find out the reason may be needed. It is very important that you tell your health care provider about any blood you see in your urine, even if the blood stops without treatment or happens without causing pain. Blood in your urine that happens and then stops and then happens again can be a symptom of a very serious condition. Also, pain is not a symptom in the initial stages of many urinary cancers. HOME CARE INSTRUCTIONS   Drink lots of fluid, 3-4 quarts a day. If you have been diagnosed with an infection, cranberry juice is especially recommended, in addition to large amounts of water.  Avoid caffeine, tea, and carbonated beverages because they tend to irritate the bladder.  Avoid alcohol because it may irritate the prostate.  Take all medicines as directed by your health care provider.  If you were prescribed an antibiotic medicine, finish it all even if you start to feel better.  If you have been diagnosed with a kidney stone, follow your health care provider's instructions regarding straining your urine to catch the stone.  Empty your bladder often. Avoid holding urine for long periods of time.  After a bowel movement, women should cleanse front to back. Use each tissue only once.  Empty your bladder before and after sexual intercourse if you are a female. SEEK MEDICAL CARE IF:  You develop back pain.  You have a fever.  You have a feeling of sickness in your stomach (nausea) or vomiting.  Your symptoms are not better in 3 days. Return sooner if you are getting worse. SEEK IMMEDIATE MEDICAL CARE IF:   You develop severe vomiting and are  unable to keep the medicine down.  You develop severe back or abdominal pain despite taking your medicines.  You begin passing a large amount of blood or clots in your urine.  You feel extremely weak or faint, or you pass out. MAKE SURE YOU:   Understand these instructions.  Will watch your condition.  Will get help right away if you are not doing well or get worse. Document Released: 08/04/2005 Document Revised: 12/19/2013 Document Reviewed: 04/04/2013 [facility] Patient Information 2015 [place], [place]. This information is not intended to replace advice given to you by your health care provider. Make sure you discuss any questions you have with your health care provider. High-Fiber Diet Fiber is found in fruits, vegetables, and grains. A high-fiber diet encourages the addition of more whole grains, legumes, fruits, and vegetables in your diet. The recommended amount of fiber for adult males is 38 g per day. For adult females, it is 25 g per day. Pregnant and lactating women should get 28 g of fiber per day. If you have a digestive or bowel problem, ask your caregiver for advice before adding high-fiber foods to your diet. Eat a variety of high-fiber foods instead of only a select few type of foods.  PURPOSE  To increase stool bulk.  To make bowel movements more regular to prevent constipation.  To lower cholesterol.  To prevent overeating. WHEN IS THIS DIET USED?  It may be used if you have constipation and hemorrhoids.  It may be used if you have  uncomplicated diverticulosis (intestine condition) and irritable bowel syndrome.  It may be used if you need help with weight management.  It may be used if you want to add it to your diet as a protective measure against atherosclerosis, diabetes, and cancer. SOURCES OF FIBER  Whole-grain breads and cereals.  Fruits, such as apples, oranges, bananas, berries, prunes, and pears.  Vegetables, such as green peas, carrots, sweet  potatoes, beets, broccoli, cabbage, spinach, and artichokes.  Legumes, such split peas, soy, lentils.  Almonds. FIBER CONTENT IN FOODS Starches and Grains / Dietary Fiber (g)  Cheerios, 1 cup / 3 g  Corn Flakes cereal, 1 cup / 0.7 g  Rice crispy treat cereal, 1 cup / 0.3 g  Instant oatmeal (cooked),  cup / 2 g  Frosted wheat cereal, 1 cup / 5.1 g  Brown, long-grain rice (cooked), 1 cup / 3.5 g  White, long-grain rice (cooked), 1 cup / 0.6 g  Enriched macaroni (cooked), 1 cup / 2.5 g Legumes / Dietary Fiber (g)  Baked beans (canned, plain, or vegetarian),  cup / 5.2 g  Kidney beans (canned),  cup / 6.8 g  Pinto beans (cooked),  cup / 5.5 g Breads and Crackers / Dietary Fiber (g)  Plain or honey graham crackers, 2 squares / 0.7 g  Saltine crackers, 3 squares / 0.3 g  Plain, salted pretzels, 10 pieces / 1.8 g  Whole-wheat bread, 1 slice / 1.9 g  White bread, 1 slice / 0.7 g  Raisin bread, 1 slice / 1.2 g  Plain bagel, 3 oz / 2 g  Flour tortilla, 1 oz / 0.9 g  Corn tortilla, 1 small / 1.5 g  Hamburger or hotdog bun, 1 small / 0.9 g Fruits / Dietary Fiber (g)  Apple with skin, 1 medium / 4.4 g  Sweetened applesauce,  cup / 1.5 g  Banana,  medium / 1.5 g  Grapes, 10 grapes / 0.4 g  Orange, 1 small / 2.3 g  Raisin, 1.5 oz / 1.6 g  Melon, 1 cup / 1.4 g Vegetables / Dietary Fiber (g)  Green beans (canned),  cup / 1.3 g  Carrots (cooked),  cup / 2.3 g  Broccoli (cooked),  cup / 2.8 g  Peas (cooked),  cup / 4.4 g  Mashed potatoes,  cup / 1.6 g  Lettuce, 1 cup / 0.5 g  Corn (canned),  cup / 1.6 g  Tomato,  cup / 1.1 g Document Released: 08/04/2005 Document Revised: 02/03/2012 Document Reviewed: 11/06/2011 [employer] Patient Information 2015 [place], [place]. This information is not intended to replace advice given to you by your health care provider. Make sure you discuss any questions you have with your health care provider.

## 2014-04-30 NOTE — ED Notes (Signed)
The pt has been unable tom have a bm for a long time.  The pt cannot remember when.  Today she has had laxatives  x2 and she has not been able  To have a bm since 1500 yesterday.  The pt is sl confused

## 2014-05-02 LAB — URINE CULTURE
Colony Count: NO GROWTH
Culture: NO GROWTH

## 2014-06-26 ENCOUNTER — Other Ambulatory Visit: Payer: Self-pay | Admitting: Family Medicine

## 2014-06-26 DIAGNOSIS — Z1231 Encounter for screening mammogram for malignant neoplasm of breast: Secondary | ICD-10-CM

## 2014-10-30 ENCOUNTER — Emergency Department (HOSPITAL_COMMUNITY): Payer: Medicare Other

## 2014-10-30 ENCOUNTER — Encounter (HOSPITAL_COMMUNITY): Payer: Self-pay | Admitting: *Deleted

## 2014-10-30 ENCOUNTER — Emergency Department (HOSPITAL_COMMUNITY)
Admission: EM | Admit: 2014-10-30 | Discharge: 2014-10-30 | Disposition: A | Discharge status: Home / Self Care | Payer: Medicare Other | Attending: Emergency Medicine | Admitting: Emergency Medicine

## 2014-10-30 DIAGNOSIS — Z91040 Latex allergy status: Secondary | ICD-10-CM | POA: Diagnosis not present

## 2014-10-30 DIAGNOSIS — Z792 Long term (current) use of antibiotics: Secondary | ICD-10-CM | POA: Insufficient documentation

## 2014-10-30 DIAGNOSIS — F329 Major depressive disorder, single episode, unspecified: Secondary | ICD-10-CM | POA: Diagnosis not present

## 2014-10-30 DIAGNOSIS — Z8659 Personal history of other mental and behavioral disorders: Secondary | ICD-10-CM | POA: Diagnosis not present

## 2014-10-30 DIAGNOSIS — Z7982 Long term (current) use of aspirin: Secondary | ICD-10-CM | POA: Insufficient documentation

## 2014-10-30 DIAGNOSIS — R000 Tachycardia, unspecified: Secondary | ICD-10-CM | POA: Insufficient documentation

## 2014-10-30 DIAGNOSIS — Z87891 Personal history of nicotine dependence: Secondary | ICD-10-CM | POA: Insufficient documentation

## 2014-10-30 DIAGNOSIS — M797 Fibromyalgia: Secondary | ICD-10-CM | POA: Diagnosis not present

## 2014-10-30 DIAGNOSIS — R0602 Shortness of breath: Secondary | ICD-10-CM | POA: Diagnosis present

## 2014-10-30 DIAGNOSIS — J441 Chronic obstructive pulmonary disease with (acute) exacerbation: Secondary | ICD-10-CM

## 2014-10-30 LAB — I-STAT ARTERIAL BLOOD GAS, ED
ACID-BASE DEFICIT: 3 mmol/L — AB (ref 0.0–2.0)
Bicarbonate: 21.2 mEq/L (ref 20.0–24.0)
O2 Saturation: 96 %
Patient temperature: 98.6
TCO2: 22 mmol/L (ref 0–100)
pCO2 arterial: 35.5 mmHg (ref 35.0–45.0)
pH, Arterial: 7.383 (ref 7.350–7.450)
pO2, Arterial: 84 mmHg (ref 80.0–100.0)

## 2014-10-30 LAB — BASIC METABOLIC PANEL
Anion gap: 13 (ref 5–15)
BUN: 14 mg/dL (ref 6–23)
CALCIUM: 9.1 mg/dL (ref 8.4–10.5)
CO2: 23 mmol/L (ref 19–32)
Chloride: 104 mmol/L (ref 96–112)
Creatinine, Ser: 0.73 mg/dL (ref 0.50–1.10)
GFR calc Af Amer: >90 mL/min (ref >90)
GFR calc non Af Amer: 88 mL/min — ABNORMAL LOW (ref >90)
Glucose, Bld: 161 mg/dL — ABNORMAL HIGH (ref 70–99)
Potassium: 3.1 mmol/L — ABNORMAL LOW (ref 3.5–5.1)
Sodium: 140 mmol/L (ref 135–145)

## 2014-10-30 LAB — CBC WITH DIFFERENTIAL/PLATELET
BASOS ABS: 0 10*3/uL (ref 0.0–0.1)
BASOS PCT: 0 % (ref 0–1)
EOS ABS: 0.4 10*3/uL (ref 0.0–0.7)
EOS PCT: 3 % (ref 0–5)
HCT: 38.1 % (ref 36.0–46.0)
HEMOGLOBIN: 12.5 g/dL (ref 12.0–15.0)
Lymphocytes Relative: 14 % (ref 12–46)
Lymphs Abs: 1.7 10*3/uL (ref 0.7–4.0)
MCH: 29.3 pg (ref 26.0–34.0)
MCHC: 32.8 g/dL (ref 30.0–36.0)
MCV: 89.4 fL (ref 78.0–100.0)
MONO ABS: 0.3 10*3/uL (ref 0.1–1.0)
Monocytes Relative: 2 % — ABNORMAL LOW (ref 3–12)
Neutro Abs: 9.9 10*3/uL — ABNORMAL HIGH (ref 1.7–7.7)
Neutrophils Relative %: 81 % — ABNORMAL HIGH (ref 43–77)
PLATELETS: 466 10*3/uL — AB (ref 150–400)
RBC: 4.26 MIL/uL (ref 3.87–5.11)
RDW: 13.9 % (ref 11.5–15.5)
WBC: 12.2 10*3/uL — AB (ref 4.0–10.5)

## 2014-10-30 MED ORDER — IPRATROPIUM-ALBUTEROL 0.5-2.5 (3) MG/3ML IN SOLN
3.0000 mL | RESPIRATORY_TRACT | Status: AC
  Administered 2014-10-30: 3 mL via RESPIRATORY_TRACT
  Filled 2014-10-30: qty 3

## 2014-10-30 MED ORDER — PREDNISONE 50 MG PO TABS: 50.0000 mg | ORAL_TABLET | Freq: Once | ORAL | Status: DC

## 2014-10-30 MED ORDER — PREDNISONE 20 MG PO TABS
50.0000 mg | ORAL_TABLET | Freq: Once | ORAL | Status: AC
  Administered 2014-10-30: 50 mg via ORAL
  Filled 2014-10-30: qty 3

## 2014-10-30 MED ORDER — METHYLPREDNISOLONE SODIUM SUCC 125 MG IJ SOLR: 125.0000 mg | Freq: Once | INTRAMUSCULAR | Status: DC

## 2014-10-30 NOTE — ED Provider Notes (Addendum)
I saw and evaluated the patient, reviewed the resident's note and I agree with the findings and plan.   EKG Interpretation   Date/Time:  Monday October 30 2014 10:59:03 EDT Ventricular Rate:  110 PR Interval:  158 QRS Duration: 117 QT Interval:  372 QTC Calculation: 503 R Axis:   10 Text Interpretation:  Sinus tachycardia Consider right atrial enlargement  LVH with secondary repolarization abnormality Anterior infarct, old  Prolonged QT interval No old tracing to compare Confirmed by Eldana Isip,  DO,  [place] ([id number]) on 10/30/2014 11:05:28 AM      Pt is a 64 y.o. female with history of COPD not on home oxygen who presents emergency department with several days of shortness of breath, wheezing, dry cough. No fever. No history of PE or DVT. No chest pain. Reports feeling much better after getting breathing treatments with EMS. Also received IV Solu-Medrol with EMS. Patient has improved with DuoNeb's. Chest x-ray shows no pneumonia. No edema. ABG reassuring.   [place], DO 10/30/14 1412   Patient reports feeling much better. Lungs now clear to auscultation with good aeration bilaterally. Oxygen saturation would not drop below 89% with ambulation. Have advised her to use her nebulizer at home every 4 hours for the next 40 hours and then every 4 hours as needed. Will discharge on steroid burst. Discussed return precautions. She and family at bedside are comfortable with plan for discharge home.  [place], DO 10/30/14 1523

## 2014-10-30 NOTE — ED Notes (Signed)
Pt states her breathing is getting progressively worse.  Hx smoking.  EMS gave pt 10mg  albuterol, 125mg  solumedrol, .5 atro.  Pt states she has been treated previously with steroids, breathing treatments and nothing is working (per pt).

## 2014-10-30 NOTE — ED Provider Notes (Signed)
CSN: [id number]     Arrival date & time 10/30/14  1054 History   First MD Initiated Contact with Patient 10/30/14 1055     Chief Complaint  Patient presents with  . Shortness of Breath     (Consider location/radiation/quality/duration/timing/severity/associated sxs/prior Treatment) Patient is a 64 y.o. female presenting with shortness of breath. The history is provided by the patient.  Shortness of Breath Severity:  Severe Onset quality:  Gradual Duration:  5 days Timing:  Constant Progression:  Worsening Chronicity:  New Context: fumes and URI   Relieved by:  Inhaler Worsened by:  Exertion Associated symptoms: cough and wheezing   Associated symptoms: no abdominal pain, no chest pain and no rash     Past Medical History  Diagnosis Date  . Depression   . Insomnia   . Fibromyalgia    Past Surgical History  Procedure Laterality Date  . Knee surgery    . C sections    . Cholecystectomy    . Foot surgery    . Shoulder surgery     Family History  Problem Relation Age of Onset  . Hyperlipidemia Mother   . Hypertension Mother   . Heart attack Mother   . Cancer Father    History  Substance Use Topics  . Smoking status: Former Smoker -- 40 years    Types: Cigarettes  . Smokeless tobacco: Not on file  . Alcohol Use: No   OB History    No data available     Review of Systems  Respiratory: Positive for cough, shortness of breath and wheezing.   Cardiovascular: Negative for chest pain.  Gastrointestinal: Negative for abdominal pain.  Skin: Negative for rash.  All other systems reviewed and are negative.     Allergies  Codeine; Latex; Prozac; Tylox; and Sulfa antibiotics  Home Medications   Prior to Admission medications   Medication Sig Start Date End Date Taking? Authorizing Provider  albuterol (ACCUNEB) 1.25 MG/3ML nebulizer solution 1.25 mg. 10/26/14 10/26/15 Yes Historical Provider, MD  albuterol (PROVENTIL HFA;VENTOLIN HFA) 108 (90 BASE) MCG/ACT  inhaler Inhale 2 puffs into the lungs. 09/20/14  Yes Historical Provider, MD  aspirin EC 81 MG tablet Take 81 mg by mouth.   Yes Historical Provider, MD  atorvastatin (LIPITOR) 20 MG tablet Take 20 mg by mouth. 08/14/14 08/14/15 Yes Historical Provider, MD  azithromycin (ZITHROMAX) 250 MG tablet 2 tablets today, 1 tablet day 2-5 10/26/14  Yes Historical Provider, MD  sertraline (ZOLOFT) 100 MG tablet Take 100 mg by mouth. 06/15/14  Yes Historical Provider, MD  Vitamin D, Ergocalciferol, (DRISDOL) 50000 UNITS CAPS capsule Take 1 capsule by mouth. 09/23/14  Yes Historical Provider, MD  cephALEXin (KEFLEX) 500 MG capsule Take 1 capsule (500 mg total) by mouth 3 (three) times daily. Patient not taking: Reported on 10/30/2014 04/30/14   Leonard Schwartz, MD  predniSONE (DELTASONE) 50 MG tablet Take 1 tablet (50 mg total) by mouth once. 10/31/14   Larence Penning, MD   BP 121/58 mmHg  Pulse 93  Temp(Src) 97.9 F (36.6 C) (Oral)  Resp 24  Ht 5\' 1"  (1.549 m)  Wt 184 lb (83.462 kg)  BMI 34.78 kg/m2  SpO2 93% Physical Exam  Constitutional: She appears well-developed and well-nourished. She appears distressed (mild respiratory distress).  HENT:  Head: Normocephalic and atraumatic.  Mouth/Throat: Oropharynx is clear and moist. No oropharyngeal exudate.  Eyes: EOM are normal. Pupils are equal, round, and reactive to light.  Neck: Normal range of motion. Neck supple.  Cardiovascular: Normal rate, regular rhythm, normal heart sounds and intact distal pulses.  Exam reveals no gallop and no friction rub.   No murmur heard. Pulmonary/Chest: No accessory muscle usage. Tachypnea noted. She is in respiratory distress. She has no decreased breath sounds. She has wheezes (throughout).  Able to complete full sentences, but visibly dyspneic  Abdominal: Soft. She exhibits no distension. There is no tenderness.  Musculoskeletal: Normal range of motion. She exhibits no edema or tenderness.  Lymphadenopathy:    She has no  cervical adenopathy.  Skin: Skin is warm and dry. No rash noted. She is not diaphoretic.  Psychiatric: She has a normal mood and affect. Her behavior is normal. Judgment and thought content normal.  Nursing note and vitals reviewed.   ED Course  Procedures (including critical care time) Labs Review Labs Reviewed  BASIC METABOLIC PANEL - Abnormal; Notable for the following:    Potassium 3.1 (*)    Glucose, Bld 161 (*)    GFR calc non Af Amer 88 (*)    All other components within normal limits  CBC WITH DIFFERENTIAL/PLATELET - Abnormal; Notable for the following:    WBC 12.2 (*)    Platelets 466 (*)    Neutrophils Relative % 81 (*)    Neutro Abs 9.9 (*)    Monocytes Relative 2 (*)    All other components within normal limits  I-STAT ARTERIAL BLOOD GAS, ED - Abnormal; Notable for the following:    Acid-base deficit 3.0 (*)    All other components within normal limits    Imaging Review Dg Chest 2 View  10/30/2014   CLINICAL DATA:  Shortness of breath and weakness.  EXAM: CHEST - 2 VIEW  COMPARISON:  None  FINDINGS: The heart size and mediastinal contours are within normal limits. Mild interstitial prominence throughout both lungs may be reflective of chronic disease. There is no evidence of pulmonary edema, consolidation, pneumothorax, nodule or pleural fluid. Mild spondylosis is present of the thoracic spine.  IMPRESSION: Mild pulmonary interstitial prominence which may reflect underlying chronic lung disease. No active disease.   Electronically Signed   By: Aletta Edouard M.D.   On: 10/30/2014 13:01     EKG Interpretation   Date/Time:  Monday October 30 2014 10:59:03 EDT Ventricular Rate:  110 PR Interval:  158 QRS Duration: 117 QT Interval:  372 QTC Calculation: 503 R Axis:   10 Text Interpretation:  Sinus tachycardia Consider right atrial enlargement  LVH with secondary repolarization abnormality Anterior infarct, old  Prolonged QT interval No old tracing to compare  Confirmed by WARD,  DO,  KRISTEN ([id number]) on 10/30/2014 11:05:28 AM      MDM   Final diagnoses:  COPD exacerbation    64 year old female with past medical history of COPD that is not on oxygen presents with shortness of breath, cough, URI. Symptoms worsening over the last week but has been progressively worsening with exertional dyspnea. She denies any chest pain. She does have an nonproductive cough and states that she is extremely short of breath when not on oxygen. Of note, she does not wear oxygen at home. She has been treated with inhalers and steroids at home her PCP but without significant improvement.  On my examination she is on a continuous neb and tachycardic, likely from the albuterol. She has diffuse respiratory wheezes without hypoxia. She is mildly tachypneic but has no focal breath sounds. This is likely a COPD exacerbation, viral URI. Chest x-ray obtained to evaluate for consolidation.  Labs including ABG also sent for evaluation. Steroids given by EMS and will be continued as scheduled. We will continue her on a continuous at this time.  2:12 PM  Labs reassuring. CXR with atelectasis and scarring but no pneumonia. AFVSS. Will turn down O2 and see how she does. If does not require O2, would be stable for outpatient therapy for likely viral illness and COPD exacerbation. Prednisone given.  Ambulated and sats no lower than 89%. Breathing much improved. Will d/c with steroids, nebs, PCP follow up for COPD exacerbation.     Larence Penning, MD 10/30/14 [phone]

## 2014-10-30 NOTE — Discharge Instructions (Signed)
Chronic Obstructive Pulmonary Disease Exacerbation ° Chronic obstructive pulmonary disease (COPD) is a common lung problem. In COPD, the flow of air from the lungs is limited. COPD exacerbations are times that breathing gets worse and you need extra treatment. Without treatment they can be life threatening. If they happen often, your lungs can become more damaged. °HOME CARE °· Do not smoke. °· Avoid tobacco smoke and other things that bother your lungs. °· If given, take your antibiotic medicine as told. Finish the medicine even if you start to feel better. °· Only take medicines as told by your doctor. °· Drink enough fluids to keep your pee (urine) clear or pale yellow (unless your doctor has told you not to). °· Use a cool mist machine (vaporizer). °· If you use oxygen or a machine that turns liquid medicine into a mist (nebulizer), continue to use them as told. °· Keep up with shots (vaccinations) as told by your doctor. °· Exercise regularly. °· Eat healthy foods. °· Keep all doctor visits as told. °GET HELP RIGHT AWAY IF: °· You are very short of breath and it gets worse. °· You have trouble talking. °· You have bad chest pain. °· You have blood in your spit (sputum). °· You have a fever. °· You keep throwing up (vomiting). °· You feel weak, or you pass out (faint). °· You feel confused. °· You keep getting worse. °MAKE SURE YOU:  °· Understand these instructions. °· Will watch your condition. °· Will get help right away if you are not doing well or get worse. °Document Released: 07/24/2011 Document Revised: 05/25/2013 Document Reviewed: 04/08/2013 °[employer]® Patient Information ©2015 [employer]. This information is not intended to replace advice given to you by your health care provider. Make sure you discuss any questions you have with your health care provider. ° °

## 2014-11-22 ENCOUNTER — Encounter: Payer: Self-pay | Admitting: Internal Medicine

## 2014-11-23 ENCOUNTER — Emergency Department
Admission: EM | Admit: 2014-11-23 | Discharge: 2014-11-23 | Disposition: A | Discharge status: Home / Self Care | Payer: Medicare Other | Source: Home / Self Care | Attending: Emergency Medicine | Admitting: Emergency Medicine

## 2014-11-23 ENCOUNTER — Telehealth: Payer: Self-pay | Admitting: Internal Medicine

## 2014-11-23 ENCOUNTER — Encounter: Payer: Self-pay | Admitting: *Deleted

## 2014-11-23 DIAGNOSIS — M25512 Pain in left shoulder: Secondary | ICD-10-CM | POA: Diagnosis not present

## 2014-11-23 HISTORY — DX: Hyperlipidemia, unspecified: E78.5

## 2014-11-23 NOTE — ED Provider Notes (Signed)
CSN: [id number]     Arrival date & time 11/23/14  1658 History   First MD Initiated Contact with Patient 11/23/14 1746     Chief Complaint  Patient presents with  . Shoulder Injury    left   (Consider location/radiation/quality/duration/timing/severity/associated sxs/prior Treatment) Patient is a 64 y.o. female presenting with shoulder injury.  Shoulder Injury  Left shoulder pain since this morning.  Was reaching for towels above W/D and felt pain in shoulder.  Moderate-severe, constant, worse with motion, better with rest.  Hasn't tried any meds/modalities.   History of R shoulder RTC repair. Has appt with ortho next Tuesday.    Past Medical History  Diagnosis Date  . Depression   . Insomnia   . Fibromyalgia   . Hyperlipidemia    Past Surgical History  Procedure Laterality Date  . Knee surgery    . C sections    . Cholecystectomy    . Foot surgery    . Shoulder surgery     Family History  Problem Relation Age of Onset  . Hyperlipidemia Mother   . Hypertension Mother   . Heart attack Mother   . Cancer Father    History  Substance Use Topics  . Smoking status: Former Smoker -- 40 years    Types: Cigarettes  . Smokeless tobacco: Never Used  . Alcohol Use: No   OB History    No data available     Review of Systems  All other systems reviewed and are negative.   Allergies  Codeine; Latex; Prozac; Tylox; and Sulfa antibiotics  Home Medications   Prior to Admission medications   Medication Sig Start Date End Date Taking? Authorizing Provider  albuterol (ACCUNEB) 1.25 MG/3ML nebulizer solution 1.25 mg. 10/26/14 10/26/15  Historical Provider, MD  albuterol (PROVENTIL HFA;VENTOLIN HFA) 108 (90 BASE) MCG/ACT inhaler Inhale 2 puffs into the lungs. 09/20/14   Historical Provider, MD  aspirin EC 81 MG tablet Take 81 mg by mouth.    Historical Provider, MD  atorvastatin (LIPITOR) 20 MG tablet Take 20 mg by mouth. 08/14/14 08/14/15  Historical Provider, MD  azithromycin  (ZITHROMAX) 250 MG tablet 2 tablets today, 1 tablet day 2-5 10/26/14   Historical Provider, MD  sertraline (ZOLOFT) 100 MG tablet Take 100 mg by mouth. 06/15/14   Historical Provider, MD  Vitamin D, Ergocalciferol, (DRISDOL) 50000 UNITS CAPS capsule Take 1 capsule by mouth. 09/23/14   Historical Provider, MD   BP 133/76 mmHg  Pulse 63  Resp 16  Wt 185 lb (83.915 kg)  SpO2 96% Physical Exam  Constitutional: She is oriented to person, place, and time. She appears well-developed and well-nourished.  Non-toxic appearance. She does not appear ill.  HENT:  Head: Normocephalic and atraumatic.  Eyes: No scleral icterus.  Neck: Neck supple.  Cardiovascular: Regular rhythm and normal heart sounds.   Pulmonary/Chest: Effort normal and breath sounds normal. No respiratory distress.  Musculoskeletal:  Left shoulder: + empty can, Luan Pulling, Neers.  ROM and strength testing cannot be assessed due to her discomfort.  DNVI.  No swelling, bruising.  Neurological: She is alert and oriented to person, place, and time.  Skin: Skin is warm and dry.  Psychiatric: She has a normal mood and affect. Her speech is normal.  Nursing note and vitals reviewed.   ED Course  ARTHOCENTESIS Date/Time: 11/23/2014 5:47 PM Performed by: Koleen Nimrod, JEFFREY H Authorized by: Janeann Forehand Consent: Verbal consent obtained. Risks and benefits: risks, benefits and alternatives were discussed Consent given  by: patient Patient understanding: patient states understanding of the procedure being performed Patient consent: the patient's understanding of the procedure matches consent given Patient identity confirmed: verbally with patient Body area: shoulder Joint: left subacromial bursa Local anesthesia used: no Patient sedated: no Preparation: Patient was prepped and draped in the usual sterile fashion. Needle gauge: 22 G Ultrasound guidance: no Approach: posterior Methylprednisolone amount: 40 mg Lidocaine 1% amount: 2  ml Patient tolerance: Patient tolerated the procedure well with no immediate complications   (including critical care time) Labs Review Labs Reviewed - No data to display  Imaging Review No results found.   MDM   1. Left shoulder pain     Injection as above.  Rest, ice, OTC nsaids, follow up with ortho as scheduled.  Janeann Forehand, MD 11/23/14 [phone]

## 2014-11-23 NOTE — ED Notes (Addendum)
Pt reports she was reaching for a towel and tried to stop one from falling this AM and felt sudden pain in her left shoulder. No previous injury. Apt with Ortho 11/28/14.

## 2014-11-23 NOTE — Telephone Encounter (Signed)
Rec'd from [facility] forward 6 pages to Dr. Henrene Pastor

## 2015-01-29 ENCOUNTER — Encounter: Payer: Medicare Other | Admitting: Internal Medicine

## 2015-02-08 ENCOUNTER — Encounter: Payer: Self-pay | Admitting: Family Medicine

## 2015-05-02 LAB — HM PAP SMEAR

## 2015-05-04 LAB — HM MAMMOGRAPHY: HM Mammogram: NORMAL

## 2015-06-27 NOTE — Patient Instructions (Addendum)
YOUR PROCEDURE IS SCHEDULED ON :  07/10/15  REPORT TO [facility] MAIN ENTRANCE FOLLOW SIGNS TO EAST ELEVATOR - GO TO 3rd FLOOR CHECK IN AT 3 EAST NURSES STATION (SHORT STAY) AT:  2:15 pm  CALL THIS NUMBER IF YOU HAVE PROBLEMS THE MORNING OF SURGERY [phone]  REMEMBER:ONLY 1 PER PERSON MAY GO TO SHORT STAY WITH YOU TO GET READY THE MORNING OF YOUR SURGERY  DO NOT EAT FOOD  AFTER MIDNIGHT  MAY HAVE CLEAR LIQUIDS UNTIL 11:00 AM  TAKE THESE MEDICINES THE MORNING OF SURGERY: MAY USE INHALER AND/OR NEBULIZER IF NEEDED  CLEAR LIQUID DIET  Foods Allowed                                                                     Foods Excluded  Coffee and tea, regular and decaf                             liquids that you cannot  Plain Jell-O in any flavor                                             see through such as: Fruit ices (not with fruit pulp)                                     milk, soups, orange juice  Iced Popsicles                                                 All solid food Carbonated beverages, regular and diet                                    Cranberry, grape and apple juices Sports drinks like Gatorade Lightly seasoned clear broth or consume(fat free) Sugar, honey syrup  _____________________________________________________________________    YOU MAY NOT HAVE ANY METAL ON YOUR BODY INCLUDING HAIR PINS AND PIERCING'S. DO NOT WEAR JEWELRY, MAKEUP, LOTIONS, POWDERS OR PERFUMES. DO NOT WEAR NAIL POLISH. DO NOT SHAVE 48 HRS PRIOR TO SURGERY. MEN MAY SHAVE FACE AND NECK.  DO NOT [place]. [facility] IS NOT RESPONSIBLE FOR VALUABLES.  CONTACTS, DENTURES OR PARTIALS MAY NOT BE WORN TO SURGERY. LEAVE SUITCASE IN CAR. CAN BE BROUGHT TO ROOM AFTER SURGERY.  PATIENTS DISCHARGED THE DAY OF SURGERY WILL NOT BE ALLOWED TO DRIVE HOME.  PLEASE READ OVER THE FOLLOWING INSTRUCTION  SHEETS _________________________________________________________________________________                                          [facility] - PREPARING FOR SURGERY  Before surgery, you can play an important role.  Because  skin is not sterile, your skin needs to be as free of germs as possible.  You can reduce the number of germs on your skin by washing with CHG (chlorahexidine gluconate) soap before surgery.  CHG is an antiseptic cleaner which kills germs and bonds with the skin to continue killing germs even after washing. Please DO NOT use if you have an allergy to CHG or antibacterial soaps.  If your skin becomes reddened/irritated stop using the CHG and inform your nurse when you arrive at Short Stay. Do not shave (including legs and underarms) for at least 48 hours prior to the first CHG shower.  You may shave your face. Please follow these instructions carefully:   1.  Shower with CHG Soap the night before surgery and the  morning of Surgery.   2.  If you choose to wash your hair, wash your hair first as usual with your  normal  Shampoo.   3.  After you shampoo, rinse your hair and body thoroughly to remove the  shampoo.                                         4.  Use CHG as you would any other liquid soap.  You can apply chg directly  to the skin and wash . Gently wash with scrungie or clean wascloth    5.  Apply the CHG Soap to your body ONLY FROM THE NECK DOWN.   Do not use on open                           Wound or open sores. Avoid contact with eyes, ears mouth and genitals (private parts).                        Genitals (private parts) with your normal soap.              6.  Wash thoroughly, paying special attention to the area where your surgery  will be performed.   7.  Thoroughly rinse your body with warm water from the neck down.   8.  DO NOT shower/wash with your normal soap after using and rinsing off  the CHG Soap .                9.  Pat yourself dry with a clean  towel.             10.  Wear clean night clothes to bed after shower             11.  Place clean sheets on your bed the night of your first shower and do not  sleep with pets.  Day of Surgery : Do not apply any lotions/deodorants the morning of surgery.  Please wear clean clothes to the hospital/surgery center.  FAILURE TO FOLLOW THESE INSTRUCTIONS MAY RESULT IN THE CANCELLATION OF YOUR SURGERY    PATIENT SIGNATURE_________________________________  ______________________________________________________________________     Adam Phenix  An incentive spirometer is a tool that can help keep your lungs clear and active. This tool measures how well you are filling your lungs with each breath. Taking long deep breaths may help reverse or decrease the chance of developing breathing (pulmonary) problems (especially infection) following:  A long period of time when you are unable to move or  be active. BEFORE THE PROCEDURE   If the spirometer includes an indicator to show your best effort, your nurse or respiratory therapist will set it to a desired goal.  If possible, sit up straight or lean slightly forward. Try not to slouch.  Hold the incentive spirometer in an upright position. INSTRUCTIONS FOR USE   Sit on the edge of your bed if possible, or sit up as far as you can in bed or on a chair.  Hold the incentive spirometer in an upright position.  Breathe out normally.  Place the mouthpiece in your mouth and seal your lips tightly around it.  Breathe in slowly and as deeply as possible, raising the piston or the ball toward the top of the column.  Hold your breath for 3-5 seconds or for as long as possible. Allow the piston or ball to fall to the bottom of the column.  Remove the mouthpiece from your mouth and breathe out normally.  Rest for a few seconds and repeat Steps 1 through 7 at least 10 times every 1-2 hours when you are awake. Take your time and take a few  normal breaths between deep breaths.  The spirometer may include an indicator to show your best effort. Use the indicator as a goal to work toward during each repetition.  After each set of 10 deep breaths, practice coughing to be sure your lungs are clear. If you have an incision (the cut made at the time of surgery), support your incision when coughing by placing a pillow or rolled up towels firmly against it. Once you are able to get out of bed, walk around indoors and cough well. You may stop using the incentive spirometer when instructed by your caregiver.  RISKS AND COMPLICATIONS  Take your time so you do not get dizzy or light-headed.  If you are in pain, you may need to take or ask for pain medication before doing incentive spirometry. It is harder to take a deep breath if you are having pain. AFTER USE  Rest and breathe slowly and easily.  It can be helpful to keep track of a log of your progress. Your caregiver can provide you with a simple table to help with this. If you are using the spirometer at home, follow these instructions: [place] IF:   You are having difficultly using the spirometer.  You have trouble using the spirometer as often as instructed.  Your pain medication is not giving enough relief while using the spirometer.  You develop fever of 100.5 F (38.1 C) or higher. SEEK IMMEDIATE MEDICAL CARE IF:   You cough up bloody sputum that had not been present before.  You develop fever of 102 F (38.9 C) or greater.  You develop worsening pain at or near the incision site. MAKE SURE YOU:   Understand these instructions.  Will watch your condition.  Will get help right away if you are not doing well or get worse. Document Released: 12/15/2006 Document Revised: 10/27/2011 Document Reviewed: 02/15/2007 Exit[employer] Patient Information 2014 [employer], [place].   ________________________________________________________________________  WHAT IS A BLOOD  TRANSFUSION? Blood Transfusion Information  A transfusion is the replacement of blood or some of its parts. Blood is made up of multiple cells which provide different functions.  Red blood cells carry oxygen and are used for blood loss replacement.  White blood cells fight against infection.  Platelets control bleeding.  Plasma helps clot blood.  Other blood products are available for specialized  needs, such as hemophilia or other clotting disorders. BEFORE THE TRANSFUSION  Who gives blood for transfusions?   Healthy volunteers who are fully evaluated to make sure their blood is safe. This is blood bank blood. Transfusion therapy is the safest it has ever been in the practice of medicine. Before blood is taken from a donor, a complete history is taken to make sure that person has no history of diseases nor engages in risky social behavior (examples are intravenous drug use or sexual activity with multiple partners). The donor's travel history is screened to minimize risk of transmitting infections, such as malaria. The donated blood is tested for signs of infectious diseases, such as HIV and hepatitis. The blood is then tested to be sure it is compatible with you in order to minimize the chance of a transfusion reaction. If you or a relative donates blood, this is often done in anticipation of surgery and is not appropriate for emergency situations. It takes many days to process the donated blood. RISKS AND COMPLICATIONS Although transfusion therapy is very safe and saves many lives, the main dangers of transfusion include:   Getting an infectious disease.  Developing a transfusion reaction. This is an allergic reaction to something in the blood you were given. Every precaution is taken to prevent this. The decision to have a blood transfusion has been considered carefully by your caregiver before blood is given. Blood is not given unless the benefits outweigh the risks. AFTER THE  TRANSFUSION  Right after receiving a blood transfusion, you will usually feel much better and more energetic. This is especially true if your red blood cells have gotten low (anemic). The transfusion raises the level of the red blood cells which carry oxygen, and this usually causes an energy increase.  The nurse administering the transfusion will monitor you carefully for complications. HOME CARE INSTRUCTIONS  No special instructions are needed after a transfusion. You may find your energy is better. Speak with your caregiver about any limitations on activity for underlying diseases you may have. SEEK MEDICAL CARE IF:   Your condition is not improving after your transfusion.  You develop redness or irritation at the intravenous (IV) site. SEEK IMMEDIATE MEDICAL CARE IF:  Any of the following symptoms occur over the next 12 hours:  Shaking chills.  You have a temperature by mouth above 102 F (38.9 C), not controlled by medicine.  Chest, back, or muscle pain.  People around you feel you are not acting correctly or are confused.  Shortness of breath or difficulty breathing.  Dizziness and fainting.  You get a rash or develop hives.  You have a decrease in urine output.  Your urine turns a dark color or changes to pink, red, or brown. Any of the following symptoms occur over the next 10 days:  You have a temperature by mouth above 102 F (38.9 C), not controlled by medicine.  Shortness of breath.  Weakness after normal activity.  The white part of the eye turns yellow (jaundice).  You have a decrease in the amount of urine or are urinating less often.  Your urine turns a dark color or changes to pink, red, or brown. Document Released: 08/01/2000 Document Revised: 10/27/2011 Document Reviewed: 03/20/2008 [facility] Patient Information 2014 [place], [place].  _______________________________________________________________________

## 2015-06-29 ENCOUNTER — Encounter (HOSPITAL_COMMUNITY)
Admission: RE | Admit: 2015-06-29 | Discharge: 2015-06-29 | Disposition: A | Discharge status: Home / Self Care | Payer: Medicare Other | Source: Ambulatory Visit | Attending: Orthopedic Surgery | Admitting: Orthopedic Surgery

## 2015-06-29 ENCOUNTER — Encounter (HOSPITAL_COMMUNITY): Payer: Self-pay

## 2015-06-29 DIAGNOSIS — Z01818 Encounter for other preprocedural examination: Secondary | ICD-10-CM | POA: Insufficient documentation

## 2015-06-29 DIAGNOSIS — M179 Osteoarthritis of knee, unspecified: Secondary | ICD-10-CM | POA: Insufficient documentation

## 2015-06-29 HISTORY — DX: Adverse effect of unspecified anesthetic, initial encounter: T41.45XA

## 2015-06-29 HISTORY — DX: Anemia, unspecified: D64.9

## 2015-06-29 HISTORY — DX: Anxiety disorder, unspecified: F41.9

## 2015-06-29 HISTORY — DX: Personal history of other diseases of the digestive system: Z87.19

## 2015-06-29 HISTORY — DX: Unspecified asthma, uncomplicated: J45.909

## 2015-06-29 HISTORY — DX: Age-related osteoporosis without current pathological fracture: M81.0

## 2015-06-29 HISTORY — DX: Personal history of urinary calculi: Z87.442

## 2015-06-29 HISTORY — DX: Chronic obstructive pulmonary disease, unspecified: J44.9

## 2015-06-29 HISTORY — DX: Other complications of anesthesia, initial encounter: T88.59XA

## 2015-06-29 HISTORY — DX: Unspecified osteoarthritis, unspecified site: M19.90

## 2015-06-29 LAB — URINALYSIS, ROUTINE W REFLEX MICROSCOPIC
Bilirubin Urine: NEGATIVE
GLUCOSE, UA: NEGATIVE mg/dL
KETONES UR: NEGATIVE mg/dL
LEUKOCYTES UA: NEGATIVE
Nitrite: NEGATIVE
PH: 5.5 (ref 5.0–8.0)
PROTEIN: NEGATIVE mg/dL
Specific Gravity, Urine: 1.024 (ref 1.005–1.030)
Urobilinogen, UA: 0.2 mg/dL (ref 0.0–1.0)

## 2015-06-29 LAB — TYPE AND SCREEN
ABO/RH(D): A POS
ANTIBODY SCREEN: NEGATIVE

## 2015-06-29 LAB — CBC
HEMATOCRIT: 39.9 % (ref 36.0–46.0)
HEMOGLOBIN: 12.8 g/dL (ref 12.0–15.0)
MCH: 28.8 pg (ref 26.0–34.0)
MCHC: 32.1 g/dL (ref 30.0–36.0)
MCV: 89.9 fL (ref 78.0–100.0)
Platelets: 472 10*3/uL — ABNORMAL HIGH (ref 150–400)
RBC: 4.44 MIL/uL (ref 3.87–5.11)
RDW: 13 % (ref 11.5–15.5)
WBC: 6.7 10*3/uL (ref 4.0–10.5)

## 2015-06-29 LAB — BASIC METABOLIC PANEL
ANION GAP: 8 (ref 5–15)
BUN: 17 mg/dL (ref 6–20)
CHLORIDE: 106 mmol/L (ref 101–111)
CO2: 27 mmol/L (ref 22–32)
Calcium: 9.4 mg/dL (ref 8.9–10.3)
Creatinine, Ser: 0.76 mg/dL (ref 0.44–1.00)
GFR calc Af Amer: >60 mL/min (ref >60)
Glucose, Bld: 109 mg/dL — ABNORMAL HIGH (ref 65–99)
POTASSIUM: 4.2 mmol/L (ref 3.5–5.1)
SODIUM: 141 mmol/L (ref 135–145)

## 2015-06-29 LAB — URINE MICROSCOPIC-ADD ON

## 2015-06-29 LAB — SURGICAL PCR SCREEN
MRSA, PCR: NEGATIVE
Staphylococcus aureus: NEGATIVE

## 2015-06-29 LAB — APTT: APTT: 30 s (ref 24–37)

## 2015-06-29 LAB — PROTIME-INR
INR: 1.06 (ref 0.00–1.49)
Prothrombin Time: 14 seconds (ref 11.6–15.2)

## 2015-06-29 LAB — ABO/RH: ABO/RH(D): A POS

## 2015-06-29 NOTE — Progress Notes (Signed)
Abnormal UA faxed to Dr.Olin 

## 2015-07-01 NOTE — H&P (Signed)
TOTAL KNEE ADMISSION H&P  Patient is being admitted for right total knee arthroplasty.  Subjective:  Chief Complaint:    Right knee primary OA / pain  HPI: Shelley Perez, 64 y.o. female, has a history of pain and functional disability in the right knee due to arthritis and has failed non-surgical conservative treatments for greater than 12 weeks to include NSAID's and/or analgesics, use of assistive devices and activity modification.  Onset of symptoms was gradual, starting >10 years ago with gradually worsening course since that time. The patient noted prior procedures on the knee to include  arthroscopy and open meniscal repair on the right knee(s).  Patient currently rates pain in the right knee(s) at 10 out of 10 with activity. Patient has night pain, worsening of pain with activity and weight bearing, pain that interferes with activities of daily living, pain with passive range of motion, crepitus and joint swelling.  Patient has evidence of periarticular osteophytes and joint space narrowing by imaging studies.  There is no active infection.   Risks, benefits and expectations were discussed with the patient.  Risks including but not limited to the risk of anesthesia, blood clots, nerve damage, blood vessel damage, failure of the prosthesis, infection and up to and including death.  Patient understand the risks, benefits and expectations and wishes to proceed with surgery.   PCP: Shelley Noon, MD  D/C Plans:      Home with HHPT  Post-op Meds:       No Rx given   Tranexamic Acid:      To be given - IV    Decadron:      Is to be given  FYI:     ASA post-op  Norco post-op  (ok per pt)    Patient Active Problem List   Diagnosis Date Noted  . SCABIES 04/19/2011  . ANXIETY 04/19/2011   Past Medical History  Diagnosis Date  . Depression   . Insomnia   . Fibromyalgia   . Hyperlipidemia   . Complication of anesthesia     "they gave me too much and I had to stay 3 days " 2005  after choley   . Asthma   . COPD (chronic obstructive pulmonary disease) ([place])   . Arthritis   . Osteoporosis   . History of kidney stones   . Hx of pancreatitis     with gallstones  . Anemia   . Anxiety     Past Surgical History  Procedure Laterality Date  . Knee surgery  1978 / 1971  . C sections    . Cholecystectomy    . Foot surgery    . Shoulder surgery    . Dental surgery      No prescriptions prior to admission   Allergies  Allergen Reactions  . Atorvastatin     Muscle and joint pain  . Codeine Nausea Only and Other (See Comments)    Stomach pains  . Crestor [Rosuvastatin]     Muscle and joint pain  . Prozac [Fluoxetine Hcl] Other (See Comments)    Changes her personality  . Latex Rash  . Sulfa Antibiotics Rash    Social History  Substance Use Topics  . Smoking status: Former Smoker -- 40 years    Types: Cigarettes    Quit date: 06/28/2013  . Smokeless tobacco: Never Used  . Alcohol Use: No    Family History  Problem Relation Age of Onset  . Hyperlipidemia Mother   . Hypertension Mother   .  Heart attack Mother   . Cancer Father      Review of Systems  Constitutional: Negative.   HENT: Negative.   Eyes: Negative.   Respiratory: Positive for cough and shortness of breath (on exertion).   Cardiovascular: Negative.   Gastrointestinal: Negative.   Genitourinary: Negative.   Musculoskeletal: Positive for joint pain.  Skin: Negative.   Neurological: Negative.   Endo/Heme/Allergies: Negative.   Psychiatric/Behavioral: Positive for depression. The patient is nervous/anxious and has insomnia.     Objective:  Physical Exam  Constitutional: She is oriented to person, place, and time. She appears well-developed and well-nourished.  HENT:  Head: Normocephalic and atraumatic.  Eyes: Pupils are equal, round, and reactive to light.  Neck: Neck supple. No JVD present. No tracheal deviation present. No thyromegaly present.  Cardiovascular: Normal rate,  regular rhythm, normal heart sounds and intact distal pulses.   Respiratory: Effort normal and breath sounds normal. No stridor. No respiratory distress. She has no wheezes.  GI: Soft. There is no tenderness. There is no guarding.  Musculoskeletal:       Right knee: She exhibits decreased range of motion, swelling and bony tenderness. She exhibits no ecchymosis, no deformity, no laceration and no erythema. Tenderness found.  Lymphadenopathy:    She has no cervical adenopathy.  Neurological: She is alert and oriented to person, place, and time.  Skin: Skin is warm and dry.  Psychiatric: She has a normal mood and affect.      Labs:  Estimated body mass index is 34.97 kg/(m^2) as calculated from the following:   Height as of 10/30/14: 5\' 1"  (1.549 m).   Weight as of 11/23/14: 83.915 kg (185 lb).   Imaging Review Plain radiographs demonstrate severe degenerative joint disease of the right knee(s). The bone quality appears to be good for age and reported activity level.  Assessment/Plan:  End stage arthritis, right knee   The patient history, physical examination, clinical judgment of the provider and imaging studies are consistent with end stage degenerative joint disease of the right knee(s) and total knee arthroplasty is deemed medically necessary. The treatment options including medical management, injection therapy arthroscopy and arthroplasty were discussed at length. The risks and benefits of total knee arthroplasty were presented and reviewed. The risks due to aseptic loosening, infection, stiffness, patella tracking problems, thromboembolic complications and other imponderables were discussed. The patient acknowledged the explanation, agreed to proceed with the plan and consent was signed. Patient is being admitted for inpatient treatment for surgery, pain control, PT, OT, prophylactic antibiotics, VTE prophylaxis, progressive ambulation and ADL's and discharge planning. The patient is  planning to be discharged home with home health services.     Shelley Perez Shelley Pech   PA-C  07/01/2015, 11:40 PM

## 2015-07-10 ENCOUNTER — Inpatient Hospital Stay (HOSPITAL_COMMUNITY)
Admission: RE | Admit: 2015-07-10 | Discharge: 2015-07-12 | DRG: 470 | Disposition: A | Discharge status: Home Health | Payer: Medicare Other | Source: Ambulatory Visit | Attending: Orthopedic Surgery | Admitting: Orthopedic Surgery

## 2015-07-10 ENCOUNTER — Inpatient Hospital Stay (HOSPITAL_COMMUNITY): Payer: Medicare Other | Admitting: Registered Nurse

## 2015-07-10 ENCOUNTER — Encounter (HOSPITAL_COMMUNITY)
Admission: RE | Disposition: A | Discharge status: Home Health | Payer: Self-pay | Source: Ambulatory Visit | Attending: Orthopedic Surgery

## 2015-07-10 ENCOUNTER — Encounter (HOSPITAL_COMMUNITY): Payer: Self-pay | Admitting: *Deleted

## 2015-07-10 DIAGNOSIS — Z8249 Family history of ischemic heart disease and other diseases of the circulatory system: Secondary | ICD-10-CM | POA: Diagnosis not present

## 2015-07-10 DIAGNOSIS — E669 Obesity, unspecified: Secondary | ICD-10-CM | POA: Diagnosis present

## 2015-07-10 DIAGNOSIS — J449 Chronic obstructive pulmonary disease, unspecified: Secondary | ICD-10-CM | POA: Diagnosis present

## 2015-07-10 DIAGNOSIS — M659 Synovitis and tenosynovitis, unspecified: Secondary | ICD-10-CM | POA: Diagnosis present

## 2015-07-10 DIAGNOSIS — M810 Age-related osteoporosis without current pathological fracture: Secondary | ICD-10-CM | POA: Diagnosis present

## 2015-07-10 DIAGNOSIS — Z6836 Body mass index (BMI) 36.0-36.9, adult: Secondary | ICD-10-CM

## 2015-07-10 DIAGNOSIS — M1711 Unilateral primary osteoarthritis, right knee: Secondary | ICD-10-CM | POA: Diagnosis present

## 2015-07-10 DIAGNOSIS — E66812 Obesity, class 2: Secondary | ICD-10-CM | POA: Diagnosis present

## 2015-07-10 DIAGNOSIS — Z87891 Personal history of nicotine dependence: Secondary | ICD-10-CM

## 2015-07-10 DIAGNOSIS — Z96651 Presence of right artificial knee joint: Secondary | ICD-10-CM

## 2015-07-10 DIAGNOSIS — Z01812 Encounter for preprocedural laboratory examination: Secondary | ICD-10-CM

## 2015-07-10 DIAGNOSIS — Z96659 Presence of unspecified artificial knee joint: Secondary | ICD-10-CM

## 2015-07-10 DIAGNOSIS — M25561 Pain in right knee: Secondary | ICD-10-CM | POA: Diagnosis present

## 2015-07-10 HISTORY — PX: TOTAL KNEE ARTHROPLASTY: SHX125

## 2015-07-10 SURGERY — ARTHROPLASTY, KNEE, TOTAL
Anesthesia: Spinal | Site: Knee | Laterality: Right

## 2015-07-10 MED ORDER — KETOROLAC TROMETHAMINE 30 MG/ML IJ SOLN
INTRAMUSCULAR | Status: AC
  Filled 2015-07-10: qty 1

## 2015-07-10 MED ORDER — DEXAMETHASONE SODIUM PHOSPHATE 10 MG/ML IJ SOLN
10.0000 mg | Freq: Once | INTRAMUSCULAR | Status: DC
  Filled 2015-07-10: qty 1

## 2015-07-10 MED ORDER — FERROUS SULFATE 325 (65 FE) MG PO TABS
325.0000 mg | ORAL_TABLET | Freq: Three times a day (TID) | ORAL | Status: DC
  Administered 2015-07-11: 325 mg via ORAL
  Filled 2015-07-10 (×7): qty 1

## 2015-07-10 MED ORDER — EPHEDRINE SULFATE 50 MG/ML IJ SOLN
INTRAMUSCULAR | Status: DC | PRN
  Administered 2015-07-10 (×3): 5 mg via INTRAVENOUS

## 2015-07-10 MED ORDER — DIPHENHYDRAMINE HCL 25 MG PO CAPS: 25.0000 mg | ORAL_CAPSULE | Freq: Four times a day (QID) | ORAL | Status: DC | PRN

## 2015-07-10 MED ORDER — MENTHOL 3 MG MT LOZG: 1.0000 | LOZENGE | OROMUCOSAL | Status: DC | PRN

## 2015-07-10 MED ORDER — CHLORHEXIDINE GLUCONATE 4 % EX LIQD: 60.0000 mL | Freq: Once | CUTANEOUS | Status: DC

## 2015-07-10 MED ORDER — ONDANSETRON HCL 4 MG/2ML IJ SOLN: 4.0000 mg | Freq: Four times a day (QID) | INTRAMUSCULAR | Status: DC | PRN

## 2015-07-10 MED ORDER — MIDAZOLAM HCL 2 MG/2ML IJ SOLN
INTRAMUSCULAR | Status: AC
  Filled 2015-07-10: qty 2

## 2015-07-10 MED ORDER — ALBUTEROL SULFATE HFA 108 (90 BASE) MCG/ACT IN AERS: 2.0000 | INHALATION_SPRAY | Freq: Four times a day (QID) | RESPIRATORY_TRACT | Status: DC | PRN

## 2015-07-10 MED ORDER — METHOCARBAMOL 500 MG PO TABS
500.0000 mg | ORAL_TABLET | Freq: Four times a day (QID) | ORAL | Status: DC | PRN
  Administered 2015-07-11 – 2015-07-12 (×4): 500 mg via ORAL
  Filled 2015-07-10 (×4): qty 1

## 2015-07-10 MED ORDER — DOCUSATE SODIUM 100 MG PO CAPS
100.0000 mg | ORAL_CAPSULE | Freq: Two times a day (BID) | ORAL | Status: DC
  Administered 2015-07-10 – 2015-07-12 (×4): 100 mg via ORAL

## 2015-07-10 MED ORDER — ALUM & MAG HYDROXIDE-SIMETH 200-200-20 MG/5ML PO SUSP
30.0000 mL | ORAL | Status: DC | PRN
Start: 2015-07-10 — End: 2015-07-12

## 2015-07-10 MED ORDER — MAGNESIUM CITRATE PO SOLN: 1.0000 | Freq: Once | ORAL | Status: DC | PRN

## 2015-07-10 MED ORDER — ONDANSETRON HCL 4 MG PO TABS: 4.0000 mg | ORAL_TABLET | Freq: Four times a day (QID) | ORAL | Status: DC | PRN

## 2015-07-10 MED ORDER — SODIUM CHLORIDE 0.9 % IJ SOLN
INTRAMUSCULAR | Status: AC
  Filled 2015-07-10: qty 50

## 2015-07-10 MED ORDER — TRANEXAMIC ACID 1000 MG/10ML IV SOLN
1000.0000 mg | Freq: Once | INTRAVENOUS | Status: AC
  Administered 2015-07-10: 1000 mg via INTRAVENOUS
  Filled 2015-07-10: qty 10

## 2015-07-10 MED ORDER — LIDOCAINE HCL (CARDIAC) 20 MG/ML IV SOLN
INTRAVENOUS | Status: DC | PRN
  Administered 2015-07-10: 100 mg via INTRAVENOUS

## 2015-07-10 MED ORDER — ASPIRIN EC 325 MG PO TBEC
325.0000 mg | DELAYED_RELEASE_TABLET | Freq: Two times a day (BID) | ORAL | Status: DC
  Administered 2015-07-11 – 2015-07-12 (×3): 325 mg via ORAL
  Filled 2015-07-10 (×5): qty 1

## 2015-07-10 MED ORDER — MEPERIDINE HCL 50 MG/ML IJ SOLN: 6.2500 mg | INTRAMUSCULAR | Status: DC | PRN

## 2015-07-10 MED ORDER — POTASSIUM CHLORIDE 2 MEQ/ML IV SOLN
INTRAVENOUS | Status: DC
  Administered 2015-07-10: 21:00:00 via INTRAVENOUS
  Filled 2015-07-10 (×3): qty 1000

## 2015-07-10 MED ORDER — PROPOFOL 500 MG/50ML IV EMUL
INTRAVENOUS | Status: DC | PRN
  Administered 2015-07-10: 100 ug/kg/min via INTRAVENOUS

## 2015-07-10 MED ORDER — MIDAZOLAM HCL 5 MG/5ML IJ SOLN
INTRAMUSCULAR | Status: DC | PRN
  Administered 2015-07-10 (×2): 1 mg via INTRAVENOUS
  Administered 2015-07-10: 2 mg via INTRAVENOUS

## 2015-07-10 MED ORDER — HYDROMORPHONE HCL 1 MG/ML IJ SOLN
0.2500 mg | INTRAMUSCULAR | Status: DC | PRN
  Administered 2015-07-10 (×2): 0.5 mg via INTRAVENOUS

## 2015-07-10 MED ORDER — CEFAZOLIN SODIUM-DEXTROSE 2-3 GM-% IV SOLR
2.0000 g | INTRAVENOUS | Status: AC
  Administered 2015-07-10: 2 g via INTRAVENOUS

## 2015-07-10 MED ORDER — PHENYLEPHRINE 40 MCG/ML (10ML) SYRINGE FOR IV PUSH (FOR BLOOD PRESSURE SUPPORT)
PREFILLED_SYRINGE | INTRAVENOUS | Status: AC
  Filled 2015-07-10: qty 10

## 2015-07-10 MED ORDER — ALBUTEROL SULFATE (2.5 MG/3ML) 0.083% IN NEBU: 3.0000 mL | INHALATION_SOLUTION | Freq: Four times a day (QID) | RESPIRATORY_TRACT | Status: DC | PRN

## 2015-07-10 MED ORDER — PROPOFOL 10 MG/ML IV BOLUS
INTRAVENOUS | Status: AC
Start: 2015-07-10 — End: 2015-07-10
  Filled 2015-07-10: qty 20

## 2015-07-10 MED ORDER — FENTANYL CITRATE (PF) 100 MCG/2ML IJ SOLN
INTRAMUSCULAR | Status: AC
  Filled 2015-07-10: qty 2

## 2015-07-10 MED ORDER — KETOROLAC TROMETHAMINE 30 MG/ML IJ SOLN
INTRAMUSCULAR | Status: DC | PRN
  Administered 2015-07-10: 30 mg

## 2015-07-10 MED ORDER — HYDROMORPHONE HCL 1 MG/ML IJ SOLN
0.5000 mg | INTRAMUSCULAR | Status: DC | PRN
  Administered 2015-07-10: 1 mg via INTRAVENOUS
  Administered 2015-07-11: 0.5 mg via INTRAVENOUS
  Administered 2015-07-11 – 2015-07-12 (×2): 1 mg via INTRAVENOUS
  Filled 2015-07-10 (×4): qty 1

## 2015-07-10 MED ORDER — FENTANYL CITRATE (PF) 100 MCG/2ML IJ SOLN
INTRAMUSCULAR | Status: DC | PRN
  Administered 2015-07-10: 25 ug via INTRAVENOUS
  Administered 2015-07-10: 50 ug via INTRAVENOUS
  Administered 2015-07-10: 25 ug via INTRAVENOUS

## 2015-07-10 MED ORDER — LIDOCAINE HCL (CARDIAC) 20 MG/ML IV SOLN
INTRAVENOUS | Status: AC
  Filled 2015-07-10: qty 5

## 2015-07-10 MED ORDER — CEFAZOLIN SODIUM-DEXTROSE 2-3 GM-% IV SOLR
INTRAVENOUS | Status: AC
  Filled 2015-07-10: qty 50

## 2015-07-10 MED ORDER — PHENOL 1.4 % MT LIQD: 1.0000 | OROMUCOSAL | Status: DC | PRN

## 2015-07-10 MED ORDER — SODIUM CHLORIDE 0.9 % IJ SOLN
INTRAMUSCULAR | Status: DC | PRN
  Administered 2015-07-10: 30 mL

## 2015-07-10 MED ORDER — LACTATED RINGERS IV SOLN
INTRAVENOUS | Status: DC
Start: 2015-07-10 — End: 2015-07-10
  Administered 2015-07-10: 18:00:00 via INTRAVENOUS
  Administered 2015-07-10: 1000 mL via INTRAVENOUS
  Administered 2015-07-10: 17:00:00 via INTRAVENOUS

## 2015-07-10 MED ORDER — BUPIVACAINE HCL (PF) 0.5 % IJ SOLN
INTRAMUSCULAR | Status: AC
  Filled 2015-07-10: qty 30

## 2015-07-10 MED ORDER — SODIUM CHLORIDE 0.9 % IR SOLN
Status: DC | PRN
  Administered 2015-07-10: 1000 mL

## 2015-07-10 MED ORDER — DEXAMETHASONE SODIUM PHOSPHATE 10 MG/ML IJ SOLN
INTRAMUSCULAR | Status: AC
  Filled 2015-07-10: qty 1

## 2015-07-10 MED ORDER — PROPOFOL 10 MG/ML IV BOLUS
INTRAVENOUS | Status: AC
  Filled 2015-07-10: qty 20

## 2015-07-10 MED ORDER — HYDROCODONE-ACETAMINOPHEN 7.5-325 MG PO TABS
1.0000 | ORAL_TABLET | ORAL | Status: DC
  Administered 2015-07-10 – 2015-07-12 (×8): 2 via ORAL
  Filled 2015-07-10 (×8): qty 2

## 2015-07-10 MED ORDER — BISACODYL 10 MG RE SUPP: 10.0000 mg | Freq: Every day | RECTAL | Status: DC | PRN

## 2015-07-10 MED ORDER — ONDANSETRON HCL 4 MG/2ML IJ SOLN: 4.0000 mg | Freq: Once | INTRAMUSCULAR | Status: DC | PRN

## 2015-07-10 MED ORDER — CEFAZOLIN SODIUM-DEXTROSE 2-3 GM-% IV SOLR
2.0000 g | Freq: Four times a day (QID) | INTRAVENOUS | Status: AC
  Administered 2015-07-10 – 2015-07-11 (×2): 2 g via INTRAVENOUS
  Filled 2015-07-10 (×2): qty 50

## 2015-07-10 MED ORDER — POLYETHYLENE GLYCOL 3350 17 G PO PACK
17.0000 g | PACK | Freq: Two times a day (BID) | ORAL | Status: DC
  Administered 2015-07-11 – 2015-07-12 (×2): 17 g via ORAL

## 2015-07-10 MED ORDER — BUPIVACAINE-EPINEPHRINE (PF) 0.25% -1:200000 IJ SOLN
INTRAMUSCULAR | Status: DC | PRN
  Administered 2015-07-10: 30 mL

## 2015-07-10 MED ORDER — BUPIVACAINE-EPINEPHRINE (PF) 0.25% -1:200000 IJ SOLN
INTRAMUSCULAR | Status: AC
  Filled 2015-07-10: qty 30

## 2015-07-10 MED ORDER — METOCLOPRAMIDE HCL 5 MG/ML IJ SOLN: 5.0000 mg | Freq: Three times a day (TID) | INTRAMUSCULAR | Status: DC | PRN

## 2015-07-10 MED ORDER — DEXAMETHASONE SODIUM PHOSPHATE 10 MG/ML IJ SOLN
10.0000 mg | Freq: Once | INTRAMUSCULAR | Status: AC
  Administered 2015-07-10: 10 mg via INTRAVENOUS

## 2015-07-10 MED ORDER — PHENYLEPHRINE HCL 10 MG/ML IJ SOLN
INTRAMUSCULAR | Status: DC | PRN
  Administered 2015-07-10 (×3): 80 ug via INTRAVENOUS

## 2015-07-10 MED ORDER — SERTRALINE HCL 100 MG PO TABS
100.0000 mg | ORAL_TABLET | Freq: Every day | ORAL | Status: DC
Start: 2015-07-11 — End: 2015-07-12
  Administered 2015-07-11 – 2015-07-12 (×2): 100 mg via ORAL
  Filled 2015-07-10 (×2): qty 1

## 2015-07-10 MED ORDER — METHOCARBAMOL 1000 MG/10ML IJ SOLN
500.0000 mg | Freq: Four times a day (QID) | INTRAVENOUS | Status: DC | PRN
  Filled 2015-07-10: qty 5

## 2015-07-10 MED ORDER — 0.9 % SODIUM CHLORIDE (POUR BTL) OPTIME
TOPICAL | Status: DC | PRN
  Administered 2015-07-10: 1000 mL

## 2015-07-10 MED ORDER — HYDROMORPHONE HCL 1 MG/ML IJ SOLN
INTRAMUSCULAR | Status: AC
  Administered 2015-07-11: 0.5 mg via INTRAVENOUS
  Filled 2015-07-10: qty 1

## 2015-07-10 MED ORDER — METOCLOPRAMIDE HCL 10 MG PO TABS: 5.0000 mg | ORAL_TABLET | Freq: Three times a day (TID) | ORAL | Status: DC | PRN

## 2015-07-10 SURGICAL SUPPLY — 49 items
BAG SPEC THK2 15X12 ZIP CLS (MISCELLANEOUS)
BAG ZIPLOCK 12X15 (MISCELLANEOUS) IMPLANT
BANDAGE ELASTIC 6 VELCRO ST LF (GAUZE/BANDAGES/DRESSINGS) ×3 IMPLANT
BLADE SAW SGTL 13.0X1.19X90.0M (BLADE) ×3 IMPLANT
BOWL SMART MIX CTS (DISPOSABLE) ×3 IMPLANT
CAPT KNEE TOTAL 3 ATTUNE ×2 IMPLANT
CATH FOLEY LATEX FREE 16FR (CATHETERS) ×2 IMPLANT
CATH SILICONE 14FRX5CC (CATHETERS) ×2 IMPLANT
CEMENT HV SMART SET (Cement) ×4 IMPLANT
CLOTH BEACON ORANGE TIMEOUT ST (SAFETY) ×3 IMPLANT
CUFF TOURN SGL QUICK 34 (TOURNIQUET CUFF) ×3
CUFF TRNQT CYL 34X4X40X1 (TOURNIQUET CUFF) ×1 IMPLANT
DECANTER SPIKE VIAL GLASS SM (MISCELLANEOUS) ×3 IMPLANT
DRAPE U-SHAPE 47X51 STRL (DRAPES) ×3 IMPLANT
DRSG AQUACEL AG ADV 3.5X10 (GAUZE/BANDAGES/DRESSINGS) ×3 IMPLANT
DURAPREP 26ML APPLICATOR (WOUND CARE) ×6 IMPLANT
ELECT REM PT RETURN 9FT ADLT (ELECTROSURGICAL) ×3
ELECTRODE REM PT RTRN 9FT ADLT (ELECTROSURGICAL) ×1 IMPLANT
GLOVE BIOGEL PI IND STRL 6.5 (GLOVE) IMPLANT
GLOVE BIOGEL PI IND STRL 7.0 (GLOVE) IMPLANT
GLOVE BIOGEL PI IND STRL 7.5 (GLOVE) ×1 IMPLANT
GLOVE BIOGEL PI IND STRL 8.5 (GLOVE) ×1 IMPLANT
GLOVE BIOGEL PI INDICATOR 6.5 (GLOVE) ×2
GLOVE BIOGEL PI INDICATOR 7.0 (GLOVE) ×2
GLOVE BIOGEL PI INDICATOR 7.5 (GLOVE) ×2
GLOVE BIOGEL PI INDICATOR 8.5 (GLOVE) ×2
GLOVE SURG SS PI 6.5 STRL IVOR (GLOVE) ×4 IMPLANT
GLOVE SURG SS PI 7.0 STRL IVOR (GLOVE) ×2 IMPLANT
GLOVE SURG SS PI 7.5 STRL IVOR (GLOVE) ×2 IMPLANT
GLOVE SURG SS PI 8.5 STRL IVOR (GLOVE) ×4
GLOVE SURG SS PI 8.5 STRL STRW (GLOVE) IMPLANT
GOWN STRL REUS W/TWL LRG LVL3 (GOWN DISPOSABLE) ×5 IMPLANT
GOWN STRL REUS W/TWL XL LVL3 (GOWN DISPOSABLE) ×5 IMPLANT
HANDPIECE INTERPULSE COAX TIP (DISPOSABLE) ×3
LIQUID BAND (GAUZE/BANDAGES/DRESSINGS) ×3 IMPLANT
MANIFOLD NEPTUNE II (INSTRUMENTS) ×3 IMPLANT
PACK TOTAL KNEE CUSTOM (KITS) ×3 IMPLANT
POSITIONER SURGICAL ARM (MISCELLANEOUS) ×3 IMPLANT
SET HNDPC FAN SPRY TIP SCT (DISPOSABLE) ×1 IMPLANT
SET PAD KNEE POSITIONER (MISCELLANEOUS) ×3 IMPLANT
SUCTION FRAZIER 12FR DISP (SUCTIONS) ×3 IMPLANT
SUT MNCRL AB 4-0 PS2 18 (SUTURE) ×3 IMPLANT
SUT VIC AB 1 CT1 36 (SUTURE) ×3 IMPLANT
SUT VIC AB 2-0 CT1 27 (SUTURE) ×9
SUT VIC AB 2-0 CT1 TAPERPNT 27 (SUTURE) ×3 IMPLANT
SUT VLOC 180 0 24IN GS25 (SUTURE) ×3 IMPLANT
WATER STERILE IRR 1500ML POUR (IV SOLUTION) ×3 IMPLANT
WRAP KNEE MAXI GEL POST OP (GAUZE/BANDAGES/DRESSINGS) ×3 IMPLANT
YANKAUER SUCT BULB TIP 10FT TU (MISCELLANEOUS) ×3 IMPLANT

## 2015-07-10 NOTE — Anesthesia Preprocedure Evaluation (Signed)
Anesthesia Evaluation  Patient identified by MRN, date of birth, ID band Patient awake    Reviewed: Allergy & Precautions, NPO status , Patient's Chart, lab work & pertinent test results  Airway Mallampati: I  TM Distance: >3 FB Neck ROM: Full    Dental   Pulmonary COPD, former smoker,    Pulmonary exam normal        Cardiovascular Normal cardiovascular exam     Neuro/Psych Anxiety Depression    GI/Hepatic   Endo/Other    Renal/GU      Musculoskeletal   Abdominal   Peds  Hematology   Anesthesia Other Findings   Reproductive/Obstetrics                             Anesthesia Physical Anesthesia Plan  ASA: II  Anesthesia Plan: Spinal   Post-op Pain Management:    Induction: Intravenous  Airway Management Planned: Simple Face Mask  Additional Equipment:   Intra-op Plan:   Post-operative Plan:   Informed Consent: I have reviewed the patients History and Physical, chart, labs and discussed the procedure including the risks, benefits and alternatives for the proposed anesthesia with the patient or authorized representative who has indicated his/her understanding and acceptance.     Plan Discussed with: CRNA and Surgeon  Anesthesia Plan Comments:         Anesthesia Quick Evaluation

## 2015-07-10 NOTE — Anesthesia Postprocedure Evaluation (Signed)
Anesthesia Post Note  Patient: Storme Fesmire  Procedure(s) Performed: Procedure(s) (LRB): RIGHT TOTAL KNEE ARTHROPLASTY (Right)  Patient location during evaluation: PACU Anesthesia Type: Spinal and MAC Level of consciousness: awake and alert Pain management: pain level controlled Vital Signs Assessment: post-procedure vital signs reviewed and stable Respiratory status: spontaneous breathing and respiratory function stable Cardiovascular status: blood pressure returned to baseline and stable Postop Assessment: Spinal receding Anesthetic complications: no    Last Vitals:  Filed Vitals:   07/10/15 1900 07/10/15 1915  BP: 120/62 123/64  Pulse: 88 89  Temp:    Resp: 17 16    Last Pain:  Filed Vitals:   07/10/15 1921  PainSc: 0-No pain    LLE Motor Response: Purposeful movement LLE Sensation: Numbness RLE Motor Response: Purposeful movement RLE Sensation: Numbness L Sensory Level: L2-Upper inner thigh, upper buttock R Sensory Level: L1-Inguinal (groin) region  [employer]

## 2015-07-10 NOTE — Transfer of Care (Signed)
Immediate Anesthesia Transfer of Care Note  Patient: Shelley Perez  Procedure(s) Performed: Procedure(s): RIGHT TOTAL KNEE ARTHROPLASTY (Right)  Patient Location: PACU  Anesthesia Type:Spinal  Level of Consciousness: awake, alert  and oriented  Airway & Oxygen Therapy: Patient Spontanous Breathing and Patient connected to face mask oxygen  Post-op Assessment: Report given to RN and Post -op Vital signs reviewed and stable  Post vital signs: Reviewed and stable  Last Vitals:  Filed Vitals:   07/10/15 1413  BP: 143/73  Pulse: 79  Temp: 36.6 C  Resp: 16    Complications: No apparent anesthesia complications

## 2015-07-10 NOTE — Interval H&P Note (Signed)
History and Physical Interval Note:  07/10/2015 4:26 PM  Shelley Perez  has presented today for surgery, with the diagnosis of RIGHT KNEE OSTEOARTHRITIS  The various methods of treatment have been discussed with the patient and family. After consideration of risks, benefits and other options for treatment, the patient has consented to  Procedure(s): RIGHT TOTAL KNEE ARTHROPLASTY (Right) as a surgical intervention .  The patient's history has been reviewed, patient examined, no change in status, stable for surgery.  I have reviewed the patient's chart and labs.  Questions were answered to the patient's satisfaction.     Mauri Pole

## 2015-07-10 NOTE — Anesthesia Procedure Notes (Addendum)
Spinal Patient location during procedure: OR Start time: 07/10/2015 4:10 PM End time: 07/10/2015 4:20 PM Staffing Anesthesiologist: Lillia Abed Performed by: anesthesiologist  Preanesthetic Checklist Completed: patient identified, site marked, surgical consent, pre-op evaluation, timeout performed, IV checked, risks and benefits discussed and monitors and equipment checked Spinal Block Patient position: sitting Prep: Betadine Patient monitoring: heart rate, cardiac monitor, continuous pulse ox and blood pressure Approach: right paramedian Location: L3-4 Injection technique: single-shot Needle Needle type: Quincke  Needle gauge: 24 G Needle length: 9 cm Needle insertion depth: 8 cm  Procedure Name: MAC Date/Time: 07/10/2015 4:10 PM Performed by: Carleene Cooper A Pre-anesthesia Checklist: Patient identified, Timeout performed, Emergency Drugs available, Patient being monitored and Suction available Patient Re-evaluated:Patient Re-evaluated prior to inductionOxygen Delivery Method: Simple face mask Dental Injury: Teeth and Oropharynx as per pre-operative assessment

## 2015-07-10 NOTE — Op Note (Signed)
NAME:  Shelley Perez                      MEDICAL RECORD NO.:  [id number]                             FACILITY:  [facility]      PHYSICIAN:  Pietro Cassis. Alvan Dame, M.D.  DATE OF BIRTH:  February 09, 1951      DATE OF PROCEDURE:  07/10/2015                                     OPERATIVE REPORT         PREOPERATIVE DIAGNOSIS:  Right knee osteoarthritis.      POSTOPERATIVE DIAGNOSIS:  Right knee osteoarthritis.      FINDINGS:  The patient was noted to have complete loss of cartilage and   bone-on-bone arthritis with associated osteophytes in all three compartments of   the knee with a significant osteophytes, synovitis and associated effusion.      PROCEDURE:  Right total knee replacement.      COMPONENTS USED:  DePuy Attune rotating platform posterior stabilized knee   system, a size 3 femur, 4 tibia, size 12 mm PS AOX insert, and 35 anatomic patellar   button.      SURGEON:  Pietro Cassis. Alvan Dame, M.D.      ASSISTANT:  Danae Orleans, PA-C.      ANESTHESIA:  Spinal.      SPECIMENS:  None.      COMPLICATION:  None.      DRAINS:  One Hemovac.  EBL: <100cc      TOURNIQUET TIME:   Total Tourniquet Time Documented: Thigh (Right) - 24 minutes Total: Thigh (Right) - 24 minutes  .      The patient was stable to the recovery room.      INDICATION FOR PROCEDURE:  Shelley Perez is a 64 y.o. female patient of   mine.  The patient had been seen, evaluated, and treated conservatively in the   office with medication, activity modification, and injections.  The patient had   radiographic changes of bone-on-bone arthritis with endplate sclerosis and osteophytes noted.      The patient failed conservative measures including medication, injections, and activity modification, and at this point was ready for more definitive measures.   Based on the radiographic changes and failed conservative measures, the patient   decided to proceed with total knee replacement.  Risks of infection,   DVT, component  failure, need for revision surgery, postop course, and   expectations were all   discussed and reviewed.  Consent was obtained for benefit of pain   relief.      PROCEDURE IN DETAIL:  The patient was brought to the operative theater.   Once adequate anesthesia, preoperative antibiotics, 2 gm of Ancef, 1 gm of Tranexamic Acid, and 10 mg of Decadron administered, the patient was positioned supine with the right thigh tourniquet placed.  The  right lower extremity was prepped and draped in sterile fashion.  A time-   out was performed identifying the patient, planned procedure, and   extremity.      The right lower extremity was placed in the [facility] leg holder.  The leg was   exsanguinated, tourniquet elevated to 250 mmHg.  A midline incision was   made followed by  median parapatellar arthrotomy.  Following initial   exposure, attention was first directed to the patella.  Precut   measurement was noted to be 17 mm.  I resected down to 13 mm and used a   35 patellar button to restore patellar height as well as cover the cut   surface.      The lug holes were drilled and a metal shim was placed to protect the   patella from retractors and saw blades.      At this point, attention was now directed to the femur.  The femoral   canal was opened with a drill, irrigated to try to prevent fat emboli.  An   intramedullary rod was passed at 3 degrees valgus, 9 mm of bone was   resected off the distal femur.  Following this resection, the tibia was   subluxated anteriorly.  Using the extramedullary guide, 2 mm of bone was resected off   the proximal medial tibia.  We confirmed the gap would be   stable medially and laterally with a size 10mm insert as well as confirmed   the cut was perpendicular in the coronal plane, checking with an alignment rod.      Once this was done, I sized the femur to be a size 3 in the anterior-   posterior dimension, chose a standard component based on medial and    lateral dimension.  The size 3 rotation block was then pinned in   position anterior referenced using the C-clamp to set rotation.  The   anterior, posterior, and  chamfer cuts were made without difficulty nor   notching making certain that I was along the anterior cortex to help   with flexion gap stability.      The final box cut was made off the lateral aspect of distal femur.  I then removed posterior osteophytes.     At this point, the tibia was sized to be a size 4, the size 4 tray was   then pinned in position through the medial third of the tubercle,   drilled, and keel punched.  Trial reduction was now carried with a 3 femur,  4 tibia, and trialed up to the 12 mm insert, and the 35 patella botton.  The knee was brought to   extension, full extension with good flexion stability with the patella   tracking through the trochlea without application of pressure.  Medial and lateral collateral ligaments were intact.  I feel that with release of such significant osteophytes that this created this space despite very conservative bone cuts.  Given   all these findings, the trial components removed.  Final components were   opened and cement was mixed.  The knee was irrigated with normal saline   solution and pulse lavage.  The synovial lining was   then injected with 30 cc of 0.25% Marcaine with epinephrine and 1 cc of Toradol plus 30 cc of NS for a   total of 61 cc.      The knee was irrigated.  Final implants were then cemented onto clean and   dried cut surfaces of bone with the knee brought to extension with a size 12 mm trial insert.      Once the cement had fully cured, the excess cement was removed   throughout the knee.  I confirmed I was satisfied with the range of   motion and stability, and the final size 12 mm PS AOX insert was  chosen.  It was   placed into the knee.      The tourniquet had been let down at 24 minutes.  No significant   hemostasis required.  The   extensor  mechanism was then reapproximated using #1 Vicryl and #0 V-lock sutures with the knee   in flexion.  The   remaining wound was closed with 2-0 Vicryl and running 4-0 Monocryl.   The knee was cleaned, dried, dressed sterilely using Dermabond and   Aquacel dressing.  The patient was then   brought to recovery room in stable condition, tolerating the procedure   well.   Please note that Physician Assistant, Danae Orleans, was present for the entirety of the case, and was utilized for pre-operative positioning, peri-operative retractor management, general facilitation of the procedure.  He was also utilized for primary wound closure at the end of the case.              Pietro Cassis Alvan Dame, M.D.    07/10/2015 5:47 PM

## 2015-07-11 ENCOUNTER — Encounter (HOSPITAL_COMMUNITY): Payer: Self-pay | Admitting: Orthopedic Surgery

## 2015-07-11 DIAGNOSIS — E669 Obesity, unspecified: Secondary | ICD-10-CM | POA: Diagnosis present

## 2015-07-11 DIAGNOSIS — Z6836 Body mass index (BMI) 36.0-36.9, adult: Secondary | ICD-10-CM

## 2015-07-11 LAB — BASIC METABOLIC PANEL
ANION GAP: 6 (ref 5–15)
BUN: 14 mg/dL (ref 6–20)
CALCIUM: 8.9 mg/dL (ref 8.9–10.3)
CO2: 26 mmol/L (ref 22–32)
Chloride: 108 mmol/L (ref 101–111)
Creatinine, Ser: 0.82 mg/dL (ref 0.44–1.00)
Glucose, Bld: 160 mg/dL — ABNORMAL HIGH (ref 65–99)
Potassium: 5.1 mmol/L (ref 3.5–5.1)
Sodium: 140 mmol/L (ref 135–145)

## 2015-07-11 LAB — CBC
HCT: 36.3 % (ref 36.0–46.0)
Hemoglobin: 11.5 g/dL — ABNORMAL LOW (ref 12.0–15.0)
MCH: 29 pg (ref 26.0–34.0)
MCHC: 31.7 g/dL (ref 30.0–36.0)
MCV: 91.4 fL (ref 78.0–100.0)
PLATELETS: 446 10*3/uL — AB (ref 150–400)
RBC: 3.97 MIL/uL (ref 3.87–5.11)
RDW: 13.3 % (ref 11.5–15.5)
WBC: 13.1 10*3/uL — AB (ref 4.0–10.5)

## 2015-07-11 MED ORDER — METHOCARBAMOL 500 MG PO TABS: 500.0000 mg | ORAL_TABLET | Freq: Four times a day (QID) | ORAL | Status: DC | PRN

## 2015-07-11 MED ORDER — HYDROCODONE-ACETAMINOPHEN 7.5-325 MG PO TABS: 1.0000 | ORAL_TABLET | ORAL | Status: DC | PRN

## 2015-07-11 MED ORDER — ASPIRIN 325 MG PO TBEC: 325.0000 mg | DELAYED_RELEASE_TABLET | Freq: Two times a day (BID) | ORAL | Status: AC

## 2015-07-11 MED ORDER — POLYETHYLENE GLYCOL 3350 17 G PO PACK: 17.0000 g | PACK | Freq: Two times a day (BID) | ORAL | Status: DC

## 2015-07-11 MED ORDER — FERROUS SULFATE 325 (65 FE) MG PO TABS: 325.0000 mg | ORAL_TABLET | Freq: Three times a day (TID) | ORAL | Status: DC

## 2015-07-11 MED ORDER — DOCUSATE SODIUM 100 MG PO CAPS: 100.0000 mg | ORAL_CAPSULE | Freq: Two times a day (BID) | ORAL | Status: DC

## 2015-07-11 NOTE — Progress Notes (Signed)
Physical Therapy Treatment Patient Details Name: Jhovana Nell MRN: [id number] DOB: [birth year] Today's Date: 07/11/2015    History of Present Illness R TKR    PT Comments    POD # 1 pm session Increased pain, RN gave IV meds.  Assisted OOB with increased time to amb in hallway.  Very slow gait.  Max c/o pain "behind the knee".  Performed some TKR TE's to tolerance.  Applied ICE.   Follow Up Recommendations  Home health PT     Equipment Recommendations  None recommended by PT (using sister's walker)    Recommendations for Other Services       Precautions / Restrictions Precautions Precautions: Knee;Fall Restrictions Weight Bearing Restrictions: No Other Position/Activity Restrictions: WBAT    Mobility  Bed Mobility Overal bed mobility: Needs Assistance Bed Mobility: Supine to Sit     Supine to sit: Min assist     General bed mobility comments: cues for sequence and use of L LE to self assist  Transfers Overall transfer level: Needs assistance Equipment used: Rolling walker (2 wheeled) Transfers: Sit to/from Stand Sit to Stand: Min assist;Mod assist         General transfer comment: cues for LE management and use of UEs to self assist  Ambulation/Gait Ambulation/Gait assistance: Min assist Ambulation Distance (Feet): 27 Feet Assistive device: Rolling walker (2 wheeled) Gait Pattern/deviations: Step-to pattern;Decreased stance time - right;Trunk flexed Gait velocity: decr   General Gait Details: cues for sequence, posture and position from RW.  "roll walker"   Stairs            Wheelchair Mobility    Modified Rankin (Stroke Patients Only)       Balance                                    Cognition Arousal/Alertness: Awake/alert Behavior During Therapy: WFL for tasks assessed/performed Overall Cognitive Status: Within Functional Limits for tasks assessed                      Exercises   Total Knee Replacement  TE's 10 reps B LE ankle pumps 10 reps towel squeezes 10 reps knee presses 10 reps heel slides AAROM 10 reps SLR's AAROM 10 reps ABD AAROM Followed by ICE     General Comments        Pertinent Vitals/Pain Pain Assessment: 0-10 Pain Score: 5  Pain Location:  esp "behind" Pain Descriptors / Indicators: Sore;Tender;Aching;Discomfort;Grimacing Pain Intervention(s): Monitored during session;Premedicated before session;Repositioned;Ice applied    Home Living Family/patient expects to be discharged to:: Private residence Living Arrangements: Other relatives Available Help at Discharge: Family         Home Equipment: Gilford Rile - 2 wheels      Prior Function Level of Independence: Independent          PT Goals (current goals can now be found in the care plan section) Progress towards PT goals: Progressing toward goals    Frequency  7X/week    PT Plan Current plan remains appropriate    Co-evaluation             End of Session Equipment Utilized During Treatment: Gait belt Activity Tolerance: Patient limited by fatigue;Patient limited by pain Patient left: in chair;with call bell/phone within reach     Time: 1545-1615 PT Time Calculation (min) (ACUTE ONLY): 30 min  Charges:  $Gait Training: 8-22 mins $Therapeutic Exercise:  8-22 mins                    G Codes:      Rica Koyanagi  PTA [facility]  Acute  Rehab Pager      [phone]

## 2015-07-11 NOTE — Care Management Note (Signed)
Case Management Note  Patient Details  Name: Shelley Perez MRN: [id number] Date of Birth: 12/06/1950  Subjective/Objective:       64 yo admitted with S/P right TKA             Action/Plan: From home with sister  Expected Discharge Date:  07/12/15               Expected Discharge Plan:  [place]  In-House Referral:     Discharge planning Services  CM Consult  Post Acute Care Choice:  Home Health Choice offered to:  Patient  DME Arranged:  N/A DME Agency:     HH Arranged:  PT HH Agency:  [facility]  Status of Service:  Completed, signed off  Medicare Important Message Given:    Date Medicare IM Given:    Medicare IM give by:    Date Additional Medicare IM Given:    Additional Medicare Important Message give by:     If discussed at [place] of Stay Meetings, dates discussed:    Additional Comments: Pt offered choice for home health services and chose [place]. [employer] rep given referral. Pt states she has a rolling walker and 3 in 1 at home already. No other CM needs communicated. Lynnell Catalan, RN 07/11/2015, 10:20 AM

## 2015-07-11 NOTE — Progress Notes (Signed)
     Subjective: 1 Day Post-Op Procedure(s) (LRB): RIGHT TOTAL KNEE ARTHROPLASTY (Right)   Patient reports pain as mild, pain controlled. No events throughout the night.  Impressed with how well the knee is doing at this point.  Ready to be discharged home if she does well with PT.  Objective:   VITALS:   Filed Vitals:   07/11/15 0008 07/11/15 0438  BP: 115/58 114/71  Pulse: 88 80  Temp: 97.6 F (36.4 C) 97.5 F (36.4 C)  Resp: 16 16    Dorsiflexion/Plantar flexion intact Incision: dressing C/D/I No cellulitis present Compartment soft  LABS  Recent Labs  07/11/15 0354  HGB 11.5*  HCT 36.3  WBC 13.1*  PLT 446*     Recent Labs  07/11/15 0354  NA 140  K 5.1  BUN 14  CREATININE 0.82  GLUCOSE 160*     Assessment/Plan: 1 Day Post-Op Procedure(s) (LRB): RIGHT TOTAL KNEE ARTHROPLASTY (Right) Foley cath d/c'ed Advance diet Up with therapy D/C IV fluids Discharge home with home health  Follow up in 2 weeks at [facility]. Follow up with OLIN,Lelan Cush D in 2 weeks.  Contact information:  [facility] [address] [place] [id number]    Obese (BMI 30-39.9) Estimated body mass index is 36.91 kg/(m^2) as calculated from the following:   Height as of this encounter: 5' (1.524 m).   Weight as of this encounter: 85.73 kg (189 lb). Patient also counseled that weight may inhibit the healing process Patient counseled that losing weight will help with future health issues        West Pugh. Yarrow Linhart   PAC  07/11/2015, 8:55 AM

## 2015-07-11 NOTE — Evaluation (Addendum)
Occupational Therapy Evaluation Patient Details Name: Shelley Perez MRN: [id number] DOB: 09/21/1950 Today's Date: 07/11/2015    History of Present Illness R TKR   Clinical Impression   This 64 year old female was admitted for the above surgery. All education was completed.  No further OT is needed at this time.    Follow Up Recommendations  No OT follow up;Supervision/Assistance - 24 hour    Equipment Recommendations  None recommended by OT (sister has a 3:1)    Recommendations for Other Services       Precautions / Restrictions Precautions Precautions: Knee;Fall Restrictions Weight Bearing Restrictions: No Other Position/Activity Restrictions: WBAT      Mobility Bed Mobility            General bed mobility comments: oob  Transfers Overall transfer level: Needs assistance Equipment used: Rolling walker (2 wheeled) Transfers: Sit to/from Stand Sit to Stand: Min assist         General transfer comment: pt correctly placed hands and RLE, but assistance given to control descent    Balance                                            ADL Overall ADL's : Needs assistance/impaired             Lower Body Bathing: Sit to/from stand;Moderate assistance       Lower Body Dressing: Maximal assistance;Sit to/from stand   Toilet Transfer: Min guard;Ambulation;Comfort height toilet;Grab bars   Toileting- Clothing Manipulation and Hygiene: Min guard;Sit to/from stand         General ADL Comments: sister will assist with adls.  Pt is able to perform UB adls with set up and LB bathing with mod A; LB dressing with max A.  Educated on tub readiness as well as tub transfer bench:  provided picture of this.  Used comfort height commode with 2 surfaces to push up from.  Pt felt like she twisted her knee earlier when she was performing hygiene sitting.     Vision     Perception     Praxis      Pertinent Vitals/Pain Pain Assessment:  0-10 Pain Score: 5  Pain Location:  esp "behind" Pain Descriptors / Indicators: Sore;Tender;Aching;Discomfort;Grimacing/throbbing Pain Intervention(s): Monitored during session;Premedicated before session;Repositioned;Ice applied; heat to thigh     Hand Dominance     Extremity/Trunk Assessment             Communication Communication Communication: No difficulties   Cognition Arousal/Alertness: Awake/alert Behavior During Therapy: WFL for tasks assessed/performed Overall Cognitive Status: Within Functional Limits for tasks assessed                     General Comments       Exercises       Shoulder Instructions      Home Living Family/patient expects to be discharged to:: Private residence Living Arrangements: Other relatives Available Help at Discharge: Family               Bathroom Shower/Tub: Teacher, early years/pre: Standard     Home Equipment: Environmental consultant - 2 wheels          Prior Functioning/Environment Level of Independence: Independent             OT Diagnosis: Acute pain   OT Problem List:  OT Treatment/Interventions:      OT Goals(Current goals can be found in the care plan section)    OT Frequency:     Barriers to D/C:            Co-evaluation              End of Session    Activity Tolerance: Patient tolerated treatment well Patient left: in chair;with call bell/phone within reach;with chair alarm set;with family/visitor present   Time: 1425-1443 and  1530-1545 OT Time Calculation (min): 33 min Charges:  OT General Charges $OT Visit: 1 Procedure OT Evaluation $Initial OT Evaluation Tier I: 1 Procedure OT Treatments $Self Care/Home Management : 8-22 mins G-Codes:    Latoi Giraldo 08-06-15, 4:56 PM   Lesle Chris, OTR/L [phone] 06-Aug-2015

## 2015-07-11 NOTE — Discharge Instructions (Addendum)

## 2015-07-11 NOTE — Evaluation (Signed)
Physical Therapy Evaluation Patient Details Name: Shelley Perez MRN: [id number] DOB: 1950-09-26 Today's Date: 07/11/2015   History of Present Illness  R TKR  Clinical Impression  Pt s/p R TKR presents with decreased R LE strength/ROM and post op pain limiting functional mobility.  Pt should progress to dc home with family assist and HHPT follow up.    Follow Up Recommendations Home health PT    Equipment Recommendations  None recommended by PT    Recommendations for Other Services OT consult     Precautions / Restrictions Precautions Precautions: Knee;Fall Restrictions Weight Bearing Restrictions: No Other Position/Activity Restrictions: WBAT      Mobility  Bed Mobility Overal bed mobility: Needs Assistance Bed Mobility: Supine to Sit     Supine to sit: Min assist     General bed mobility comments: cues for sequence and use of L LE to self assist  Transfers Overall transfer level: Needs assistance Equipment used: Rolling walker (2 wheeled) Transfers: Sit to/from Stand Sit to Stand: Min assist;Mod assist         General transfer comment: cues for LE management and use of UEs to self assist  Ambulation/Gait Ambulation/Gait assistance: Min assist Ambulation Distance (Feet): 39 Feet Assistive device: Rolling walker (2 wheeled) Gait Pattern/deviations: Step-to pattern;Decreased step length - right;Decreased step length - left;Shuffle;Trunk flexed Gait velocity: decr   General Gait Details: cues for sequence, posture and position from [employer]            Wheelchair Mobility    Modified Rankin (Stroke Patients Only)       Balance                                             Pertinent Vitals/Pain Pain Assessment: 0-10 Pain Score: 5  Pain Location: R knee Pain Descriptors / Indicators: Aching;Sore Pain Intervention(s): Limited activity within patient's tolerance;Monitored during session;Premedicated before session;Ice  applied    Home Living Family/patient expects to be discharged to:: Private residence Living Arrangements: Other relatives Available Help at Discharge: Family Type of Home: House Home Access: Stairs to enter Entrance Stairs-Rails: None Entrance Stairs-Number of Steps: 4 Home Layout: One level Home Equipment: Environmental consultant - 2 wheels      Prior Function Level of Independence: Independent               Hand Dominance   Dominant Hand: Right    Extremity/Trunk Assessment   Upper Extremity Assessment: Generalized weakness           Lower Extremity Assessment: RLE deficits/detail;LLE deficits/detail RLE Deficits / Details: 3-/5 quads with AAROM at R knee -10- 40. LLE Deficits / Details: Strength 4/5 with AROM to 90 flex  Cervical / Trunk Assessment: Kyphotic  Communication   Communication: No difficulties  Cognition Arousal/Alertness: Awake/alert Behavior During Therapy: WFL for tasks assessed/performed Overall Cognitive Status: Within Functional Limits for tasks assessed                      General Comments      Exercises Total Joint Exercises Ankle Circles/Pumps: AROM;Both;15 reps;Supine Quad Sets: AROM;Both;10 reps;Supine Heel Slides: AAROM;Right;10 reps;Supine Straight Leg Raises: AAROM;Right;10 reps;Supine      Assessment/Plan    PT Assessment Patient needs continued PT services  PT Diagnosis Difficulty walking   PT Problem List Decreased strength;Decreased range of motion;Decreased activity tolerance;Decreased mobility;Decreased knowledge of  use of DME;Obesity;Pain  PT Treatment Interventions DME instruction;Gait training;Stair training;Functional mobility training;Therapeutic activities;Therapeutic exercise;Patient/family education   PT Goals (Current goals can be found in the Care Plan section) Acute Rehab PT Goals Patient Stated Goal: Walk with less pain PT Goal Formulation: With patient Time For Goal Achievement: 07/14/15 Potential to  Achieve Goals: Good    Frequency 7X/week   Barriers to discharge        Co-evaluation               End of Session Equipment Utilized During Treatment: Gait belt Activity Tolerance: Patient tolerated treatment well Patient left: in chair;with call bell/phone within reach Nurse Communication: Mobility status         Time: 0925-1003 PT Time Calculation (min) (ACUTE ONLY): 38 min   Charges:   PT Evaluation $Initial PT Evaluation Tier I: 1 Procedure PT Treatments $Gait Training: 8-22 mins $Therapeutic Exercise: 8-22 mins   PT G Codes:        Shelley Perez 08/09/15, 12:25 PM

## 2015-07-12 LAB — BASIC METABOLIC PANEL
Anion gap: 5 (ref 5–15)
BUN: 20 mg/dL (ref 6–20)
CHLORIDE: 101 mmol/L (ref 101–111)
CO2: 26 mmol/L (ref 22–32)
Calcium: 8.7 mg/dL — ABNORMAL LOW (ref 8.9–10.3)
Creatinine, Ser: 0.76 mg/dL (ref 0.44–1.00)
GFR calc Af Amer: >60 mL/min (ref >60)
GFR calc non Af Amer: >60 mL/min (ref >60)
Glucose, Bld: 124 mg/dL — ABNORMAL HIGH (ref 65–99)
POTASSIUM: 3.8 mmol/L (ref 3.5–5.1)
SODIUM: 132 mmol/L — AB (ref 135–145)

## 2015-07-12 LAB — CBC
HEMATOCRIT: 30.7 % — AB (ref 36.0–46.0)
HEMOGLOBIN: 9.6 g/dL — AB (ref 12.0–15.0)
MCH: 28.2 pg (ref 26.0–34.0)
MCHC: 31.3 g/dL (ref 30.0–36.0)
MCV: 90 fL (ref 78.0–100.0)
Platelets: 410 10*3/uL — ABNORMAL HIGH (ref 150–400)
RBC: 3.41 MIL/uL — AB (ref 3.87–5.11)
RDW: 13.4 % (ref 11.5–15.5)
WBC: 11.5 10*3/uL — ABNORMAL HIGH (ref 4.0–10.5)

## 2015-07-12 MED ORDER — OXYCODONE HCL 5 MG PO TABS: 5.0000 mg | ORAL_TABLET | ORAL | Status: DC | PRN

## 2015-07-12 MED ORDER — OXYCODONE HCL 5 MG PO TABS
5.0000 mg | ORAL_TABLET | ORAL | Status: DC | PRN
  Administered 2015-07-12 (×2): 10 mg via ORAL
  Filled 2015-07-12 (×2): qty 2

## 2015-07-12 NOTE — Progress Notes (Signed)
Physical Therapy Treatment Patient Details Name: Shelley Perez MRN: [id number] DOB: 12/23/1950 Today's Date: 07/12/2015    History of Present Illness R TKR    PT Comments    Pt continues to progress with mobility.  Reviewed stairs with pt and sister.  Written instructions provided and recommended pt recruit extra help to access house.  Pt sister states that pt's dtr Crystal was planning to assist.  Follow Up Recommendations  Home health PT     Equipment Recommendations  None recommended by PT    Recommendations for Other Services OT consult     Precautions / Restrictions Precautions Precautions: Knee;Fall Restrictions Weight Bearing Restrictions: No Other Position/Activity Restrictions: WBAT    Mobility  Bed Mobility Overal bed mobility: Needs Assistance Bed Mobility: Supine to Sit;Sit to Supine     Supine to sit: Supervision Sit to supine: Min guard   General bed mobility comments: increased time and min cues for sequence and use of L LE to self assist  Transfers Overall transfer level: Needs assistance Equipment used: Rolling walker (2 wheeled) Transfers: Sit to/from Stand Sit to Stand: Supervision         General transfer comment: min cues for use of UEs to self assist  Ambulation/Gait Ambulation/Gait assistance: Min guard;Supervision Ambulation Distance (Feet): 50 Feet Assistive device: Rolling walker (2 wheeled) Gait Pattern/deviations: Step-to pattern;Decreased step length - right;Decreased step length - left;Shuffle;Trunk flexed Gait velocity: decr   General Gait Details: min cues for sequence, posture and position from RW.  "roll walker"   Stairs Stairs: Yes Stairs assistance: Min assist Stair Management: No rails;Step to pattern;Backwards;With walker Number of Stairs: 6 General stair comments: cues for sequence and foot/RW placement.  Pts' sister assisting  Wheelchair Mobility    Modified Rankin (Stroke Patients Only)        Balance                                    Cognition Arousal/Alertness: Awake/alert Behavior During Therapy: WFL for tasks assessed/performed Overall Cognitive Status: Within Functional Limits for tasks assessed                      Exercises Total Joint Exercises Ankle Circles/Pumps: AROM;Both;15 reps;Supine Quad Sets: AROM;Both;10 reps;Supine Heel Slides: AAROM;Right;10 reps;Supine Straight Leg Raises: AAROM;Right;10 reps;Supine Goniometric ROM: AAROM at R knee -10 - 40    General Comments        Pertinent Vitals/Pain Pain Assessment: 0-10 Pain Score: 4  Pain Location: R knee Pain Descriptors / Indicators: Aching;Sore Pain Intervention(s): Limited activity within patient's tolerance;Monitored during session;Premedicated before session;Ice applied    Home Living                      Prior Function            PT Goals (current goals can now be found in the care plan section) Acute Rehab PT Goals Patient Stated Goal: Walk with less pain PT Goal Formulation: With patient Time For Goal Achievement: 07/14/15 Potential to Achieve Goals: Good Progress towards PT goals: Progressing toward goals    Frequency  7X/week    PT Plan Current plan remains appropriate    Co-evaluation             End of Session Equipment Utilized During Treatment: Gait belt Activity Tolerance: Patient limited by fatigue Patient left: in bed;with call bell/phone within reach;with family/visitor  present     Time: [id number] PT Time Calculation (min) (ACUTE ONLY): 35 min  Charges:  $Gait Training: 8-22 mins $Therapeutic Exercise: 8-22 mins $Therapeutic Activity: 8-22 mins                    G Codes:      Dauntae Derusha 07-14-15, 12:52 PM

## 2015-07-12 NOTE — Progress Notes (Signed)
Physical Therapy Treatment Patient Details Name: Shelley Perez MRN: [id number] DOB: 1951-03-09 Today's Date: 07/12/2015    History of Present Illness R TKR    PT Comments    Improvement noted in pain control and activity tolerance.  Stairs deferred to arrival of pt's sister.  Follow Up Recommendations  Home health PT     Equipment Recommendations  None recommended by PT    Recommendations for Other Services OT consult     Precautions / Restrictions Precautions Precautions: Knee;Fall Restrictions Weight Bearing Restrictions: No Other Position/Activity Restrictions: WBAT    Mobility  Bed Mobility Overal bed mobility: Needs Assistance Bed Mobility: Supine to Sit     Supine to sit: Min guard     General bed mobility comments: increased time and min cues for sequence and use of L LE to self assist  Transfers Overall transfer level: Needs assistance Equipment used: Rolling walker (2 wheeled) Transfers: Sit to/from Stand Sit to Stand: Min guard         General transfer comment: min cues for useof UEs to self assist  Ambulation/Gait Ambulation/Gait assistance: Min guard;Supervision Ambulation Distance (Feet): 75 Feet Assistive device: Rolling walker (2 wheeled) Gait Pattern/deviations: Step-to pattern;Decreased step length - right;Decreased step length - left;Shuffle;Trunk flexed Gait velocity: decr   General Gait Details: cues for sequence, posture and position from RW.  "roll walker"   Stairs            Wheelchair Mobility    Modified Rankin (Stroke Patients Only)       Balance                                    Cognition Arousal/Alertness: Awake/alert Behavior During Therapy: WFL for tasks assessed/performed Overall Cognitive Status: Within Functional Limits for tasks assessed                      Exercises Total Joint Exercises Ankle Circles/Pumps: AROM;Both;15 reps;Supine Quad Sets: AROM;Both;10  reps;Supine Heel Slides: AAROM;Right;10 reps;Supine Straight Leg Raises: AAROM;Right;10 reps;Supine Goniometric ROM: AAROM at R knee -10 - 40    General Comments        Pertinent Vitals/Pain Pain Assessment: 0-10 Pain Score: 5  Pain Location: R knee Pain Descriptors / Indicators: Aching;Sore Pain Intervention(s): Limited activity within patient's tolerance;Monitored during session;Premedicated before session;Ice applied    Home Living                      Prior Function            PT Goals (current goals can now be found in the care plan section) Acute Rehab PT Goals Patient Stated Goal: Walk with less pain PT Goal Formulation: With patient Time For Goal Achievement: 07/14/15 Potential to Achieve Goals: Good Progress towards PT goals: Progressing toward goals    Frequency  7X/week    PT Plan Current plan remains appropriate    Co-evaluation             End of Session Equipment Utilized During Treatment: Gait belt Activity Tolerance: Patient limited by fatigue Patient left: in chair;with call bell/phone within reach     Time: 0847-0915 PT Time Calculation (min) (ACUTE ONLY): 28 min  Charges:  $Gait Training: 8-22 mins $Therapeutic Exercise: 8-22 mins                    G Codes:  Shelley Perez 07/12/2015, 12:47 PM

## 2015-07-12 NOTE — Progress Notes (Signed)
Patient ID: Shelley Perez, female   DOB: 03/18/1951, 64 y.o.   MRN: [id number] Subjective: 2 Days Post-Op Procedure(s) (LRB): RIGHT TOTAL KNEE ARTHROPLASTY (Right)    Patient reports pain as moderate.  Doesn't feel Norco helping as much as she'd like, using breakthrough Dilaudid  Objective:   VITALS:   Filed Vitals:   07/11/15 2100 07/12/15 0427  BP: 111/59 126/62  Pulse: 83 90  Temp: 98.7 F (37.1 C) 98.6 F (37 C)  Resp: 16 21    Neurovascular intact Incision: dressing C/D/I  LABS  Recent Labs  07/11/15 0354 07/12/15 0425  HGB 11.5* 9.6*  HCT 36.3 30.7*  WBC 13.1* 11.5*  PLT 446* 410*     Recent Labs  07/11/15 0354 07/12/15 0425  NA 140 132*  K 5.1 3.8  BUN 14 20  CREATININE 0.82 0.76  GLUCOSE 160* 124*    No results for input(s): LABPT, INR in the last 72 hours.   Assessment/Plan: 2 Days Post-Op Procedure(s) (LRB): RIGHT TOTAL KNEE ARTHROPLASTY (Right)   Discharge home with home health  Will try Oxycodone today for pain control and even though family took Rx yesterday for Norco will give Rx for oxycodone to use if Norco unsuccessful Reviewed and stressed goals Constipation prevention reviewed

## 2015-07-20 NOTE — Discharge Summary (Signed)
Physician Discharge Summary  Patient ID: Shelley Perez MRN: [id number] DOB/AGE: 1951-06-13 64 y.o.  Admit date: 07/10/2015 Discharge date: 07/12/2015   Procedures:  Procedure(s) (LRB): RIGHT TOTAL KNEE ARTHROPLASTY (Right)  Attending Physician:  Dr. Paralee Cancel   Admission Diagnoses:   Right knee primary OA / pain  Discharge Diagnoses:  Principal Problem:   S/P right TKA Active Problems:   Obese  Past Medical History  Diagnosis Date  . Depression   . Insomnia   . Fibromyalgia   . Hyperlipidemia   . Complication of anesthesia     "they gave me too much and I had to stay 3 days " 2005 after choley   . Asthma   . COPD (chronic obstructive pulmonary disease) ([place])   . Arthritis   . Osteoporosis   . History of kidney stones   . Hx of pancreatitis     with gallstones  . Anemia   . Anxiety     HPI:    Shelley Perez, 64 y.o. female, has a history of pain and functional disability in the right knee due to arthritis and has failed non-surgical conservative treatments for greater than 12 weeks to include NSAID's and/or analgesics, use of assistive devices and activity modification. Onset of symptoms was gradual, starting >10 years ago with gradually worsening course since that time. The patient noted prior procedures on the knee to include arthroscopy and open meniscal repair on the right knee(s). Patient currently rates pain in the right knee(s) at 10 out of 10 with activity. Patient has night pain, worsening of pain with activity and weight bearing, pain that interferes with activities of daily living, pain with passive range of motion, crepitus and joint swelling. Patient has evidence of periarticular osteophytes and joint space narrowing by imaging studies. There is no active infection. Risks, benefits and expectations were discussed with the patient. Risks including but not limited to the risk of anesthesia, blood clots, nerve damage, blood vessel damage, failure of  the prosthesis, infection and up to and including death. Patient understand the risks, benefits and expectations and wishes to proceed with surgery.   PCP: Chesley Noon, MD   Discharged Condition: good  Hospital Course:  Patient underwent the above stated procedure on 07/10/2015. Patient tolerated the procedure well and brought to the recovery room in good condition and subsequently to the floor.  POD #1 BP: 114/71 ; Pulse: 80 ; Temp: 97.5 F (36.4 C) ; Resp: 16 Patient reports pain as mild, pain controlled. No events throughout the night. Impressed with how well the knee is doing at this point. Dorsiflexion/plantar flexion intact, incision: dressing C/D/I, no cellulitis present and compartment soft.   LABS  Basename    HGB     11.5  HCT     36.3   POD #2  BP: 126/62 ; Pulse: 90 ; Temp: 98.6 F (37 C) ; Resp: 21 Patient reports pain as moderate. Doesn't feel Norco helping as much as she'd like, using breakthrough Dilaudid. Dorsiflexion/plantar flexion intact, incision: dressing C/D/I, no cellulitis present and compartment soft.   LABS  Basename    HGB     9.6  HCT     30.7    Discharge Exam: General appearance: alert, cooperative and no distress Extremities: Homans sign is negative, no sign of DVT, no edema, redness or tenderness in the calves or thighs and no ulcers, gangrene or trophic changes  Disposition: Home with follow up in 2 weeks   Follow-up Information  Follow up with Mauri Pole, MD. Schedule an appointment as soon as possible for a visit in 2 weeks.   Specialty:  Orthopedic Surgery   Contact information:   [address] [place] [address] [id number]       Discharge Instructions    Call MD / Call 911    Complete by:  As directed   If you experience chest pain or shortness of breath, CALL 911 and be transported to the hospital emergency room.  If you develope a fever above 101 F, pus (white drainage) or increased drainage  or redness at the wound, or calf pain, call your surgeon's office.     Call MD / Call 911    Complete by:  As directed   If you experience chest pain or shortness of breath, CALL 911 and be transported to the hospital emergency room.  If you develope a fever above 101 F, pus (white drainage) or increased drainage or redness at the wound, or calf pain, call your surgeon's office.     Change dressing    Complete by:  As directed   Maintain surgical dressing until follow up in the clinic. If the edges start to pull up, may reinforce with tape. If the dressing is no longer working, may remove and cover with gauze and tape, but must keep the area dry and clean.  Call with any questions or concerns.     Constipation Prevention    Complete by:  As directed   Drink plenty of fluids.  Prune juice may be helpful.  You may use a stool softener, such as Colace (over the counter) 100 mg twice a day.  Use MiraLax (over the counter) for constipation as needed.     Constipation Prevention    Complete by:  As directed   Drink plenty of fluids.  Prune juice may be helpful.  You may use a stool softener, such as Colace (over the counter) 100 mg twice a day.  Use MiraLax (over the counter) for constipation as needed.     Diet - low sodium heart healthy    Complete by:  As directed      Diet - low sodium heart healthy    Complete by:  As directed      Discharge instructions    Complete by:  As directed   Maintain surgical dressing until follow up in the clinic. If the edges start to pull up, may reinforce with tape. If the dressing is no longer working, may remove and cover with gauze and tape, but must keep the area dry and clean.  Follow up in 2 weeks at [facility]. Call with any questions or concerns.     Discharge instructions    Complete by:  As directed   INSTRUCTIONS AFTER JOINT REPLACEMENT   Remove items at home which could result in a fall. This includes throw rugs or furniture in walking  pathways ICE to the affected joint every three hours while awake for 30 minutes at a time, for at least the first 3-5 days, and then as needed for pain and swelling.  Continue to use ice for pain and swelling. You may notice swelling that will progress down to the foot and ankle.  This is normal after surgery.  Elevate your leg when you are not up walking on it.   Continue to use the breathing machine you got in the hospital (incentive spirometer) which will help keep your temperature down.  It is common for your temperature to cycle up and down following surgery, especially at night when you are not up moving around and exerting yourself.  The breathing machine keeps your lungs expanded and your temperature down.   DIET:  As you were doing prior to hospitalization, we recommend a well-balanced diet.  DRESSING / WOUND CARE / SHOWERING  Keep the surgical dressing until follow up.  The dressing is water proof, so you can shower without any extra covering.  IF THE DRESSING FALLS OFF or the wound gets wet inside, change the dressing with sterile gauze.  Please use good hand washing techniques before changing the dressing.  Do not use any lotions or creams on the incision until instructed by your surgeon.    ACTIVITY  Increase activity slowly as tolerated, but follow the weight bearing instructions below.   No driving for 6 weeks or until further direction given by your physician.  You cannot drive while taking narcotics.  No lifting or carrying greater than 10 lbs. until further directed by your surgeon. Avoid periods of inactivity such as sitting longer than an hour when not asleep. This helps prevent blood clots.  You may return to work once you are authorized by your doctor.     WEIGHT BEARING   Weight bearing as tolerated with assist device (walker, cane, etc) as directed, use it as long as suggested by your surgeon or therapist, typically at least 4-6 weeks.   EXERCISES  Results after  joint replacement surgery are often greatly improved when you follow the exercise, range of motion and muscle strengthening exercises prescribed by your doctor. Safety measures are also important to protect the joint from further injury. Any time any of these exercises cause you to have increased pain or swelling, decrease what you are doing until you are comfortable again and then slowly increase them. If you have problems or questions, call your caregiver or physical therapist for advice.   Rehabilitation is important following a joint replacement. After just a few days of immobilization, the muscles of the leg can become weakened and shrink (atrophy).  These exercises are designed to build up the tone and strength of the thigh and leg muscles and to improve motion. Often times heat used for twenty to thirty minutes before working out will loosen up your tissues and help with improving the range of motion but do not use heat for the first two weeks following surgery (sometimes heat can increase post-operative swelling).   These exercises can be done on a training (exercise) mat, on the floor, on a table or on a bed. Use whatever works the best and is most comfortable for you.    Use music or television while you are exercising so that the exercises are a pleasant break in your day. This will make your life better with the exercises acting as a break in your routine that you can look forward to.   Perform all exercises about fifteen times, three times per day or as directed.  You should exercise both the operative leg and the other leg as well.   Exercises include:   Quad Sets - Tighten up the muscle on the front of the thigh (Quad) and hold for 5-10 seconds.   Straight Leg Raises - With your knee straight (if you were given a brace, keep it on), lift the leg to 60 degrees, hold for 3 seconds, and slowly lower the leg.  Perform this exercise against resistance later as  your leg gets stronger.  Leg Slides:  Lying on your back, slowly slide your foot toward your buttocks, bending your knee up off the floor (only go as far as is comfortable). Then slowly slide your foot back down until your leg is flat on the floor again.  Angel Wings: Lying on your back spread your legs to the side as far apart as you can without causing discomfort.  Hamstring Strength:  Lying on your back, push your heel against the floor with your leg straight by tightening up the muscles of your buttocks.  Repeat, but this time bend your knee to a comfortable angle, and push your heel against the floor.  You may put a pillow under the heel to make it more comfortable if necessary.   A rehabilitation program following joint replacement surgery can speed recovery and prevent re-injury in the future due to weakened muscles. Contact your doctor or a physical therapist for more information on knee rehabilitation.    CONSTIPATION  Constipation is defined medically as fewer than three stools per week and severe constipation as less than one stool per week.  Even if you have a regular bowel pattern at home, your normal regimen is likely to be disrupted due to multiple reasons following surgery.  Combination of anesthesia, postoperative narcotics, change in appetite and fluid intake all can affect your bowels.   YOU MUST use at least one of the following options; they are listed in order of increasing strength to get the job done.  They are all available over the counter, and you may need to use some, POSSIBLY even all of these options:    Drink plenty of fluids (prune juice may be helpful) and high fiber foods Colace 100 mg by mouth twice a day  Senokot for constipation as directed and as needed Dulcolax (bisacodyl), take with full glass of water  Miralax (polyethylene glycol) once or twice a day as needed.  If you have tried all these things and are unable to have a bowel movement in the first 3-4 days after surgery call either your  surgeon or your primary doctor.    If you experience loose stools or diarrhea, hold the medications until you stool forms back up.  If your symptoms do not get better within 1 week or if they get worse, check with your doctor.  If you experience "the worst abdominal pain ever" or develop nausea or vomiting, please contact the office immediately for further recommendations for treatment.   ITCHING:  If you experience itching with your medications, try taking only a single pain pill, or even half a pain pill at a time.  You can also use Benadryl over the counter for itching or also to help with sleep.   TED HOSE STOCKINGS:  Use stockings on both legs until for at least 2 weeks or as directed by physician office. They may be removed at night for sleeping.  MEDICATIONS:  See your medication summary on the "After Visit Summary" that nursing will review with you.  You may have some home medications which will be placed on hold until you complete the course of blood thinner medication.  It is important for you to complete the blood thinner medication as prescribed.  PRECAUTIONS:  If you experience chest pain or shortness of breath - call 911 immediately for transfer to the hospital emergency department.   If you develop a fever greater that 101 F, purulent drainage from wound, increased redness or drainage from  wound, foul odor from the wound/dressing, or calf pain - CONTACT YOUR SURGEON.                                                   FOLLOW-UP APPOINTMENTS:  If you do not already have a post-op appointment, please call the office for an appointment to be seen by your surgeon.  Guidelines for how soon to be seen are listed in your "After Visit Summary", but are typically between 1-4 weeks after surgery.  OTHER INSTRUCTIONS:   Knee Replacement:  Do not place pillow under knee, focus on keeping the knee straight while resting. CPM instructions: 0-90 degrees, 2 hours in the morning, 2 hours in the  afternoon, and 2 hours in the evening. Place foam block, curve side up under heel at all times except when in CPM or when walking.  DO NOT modify, tear, cut, or change the foam block in any way.  MAKE SURE YOU:  Understand these instructions.  Get help right away if you are not doing well or get worse.    Thank you for letting us be a part of your medical care team.  It is a privilege we respect greatly.  We hope these instructions will help you stay on track for a fast and full recovery!     Increase activity slowly as tolerated    Complete by:  As directed   Weight bearing as tolerated with assist device (walker, cane, etc) as directed, use it as long as suggested by your surgeon or therapist, typically at least 4-6 weeks.     Increase activity slowly as tolerated    Complete by:  As directed      TED hose    Complete by:  As directed   Use stockings (TED hose) for 2 weeks on both leg(s).  You may remove them at night for sleeping.     TED hose    Complete by:  As directed   Use stockings (TED hose) for 2 weeks on both leg(s).  You may remove them at night for sleeping.             Medication List    TAKE these medications        albuterol 108 (90 BASE) MCG/ACT inhaler  Commonly known as:  PROVENTIL HFA;VENTOLIN HFA  Inhale 2 puffs into the lungs every 6 (six) hours as needed for wheezing or shortness of breath.     albuterol 1.25 MG/3ML nebulizer solution  Commonly known as:  ACCUNEB  1.25 mg every 6 (six) hours as needed for wheezing or shortness of breath.     aspirin 325 MG EC tablet  Take 1 tablet (325 mg total) by mouth 2 (two) times daily.     COENZYME Q-10 PO  Take 100 mg by mouth daily.     docusate sodium 100 MG capsule  Commonly known as:  COLACE  Take 1 capsule (100 mg total) by mouth 2 (two) times daily.     ferrous sulfate 325 (65 FE) MG tablet  Take 1 tablet (325 mg total) by mouth 3 (three) times daily after meals.     HYDROcodone-acetaminophen  7.5-325 MG tablet  Commonly known as:  NORCO  Take 1-2 tablets by mouth every 4 (four) hours as needed for moderate pain.     methocarbamol 500  MG tablet  Commonly known as:  ROBAXIN  Take 1 tablet (500 mg total) by mouth every 6 (six) hours as needed for muscle spasms.     omega-3 acid ethyl esters 1 G capsule  Commonly known as:  LOVAZA  Take 1 g by mouth 2 (two) times daily.     OSTEO BI-FLEX JOINT SHIELD Tabs  Take 1 tablet by mouth daily.     oxyCODONE 5 MG immediate release tablet  Commonly known as:  Oxy IR/ROXICODONE  Take 1-3 tablets (5-15 mg total) by mouth every 4 (four) hours as needed for moderate pain or severe pain.     polyethylene glycol packet  Commonly known as:  MIRALAX / GLYCOLAX  Take 17 g by mouth 2 (two) times daily.     sertraline 100 MG tablet  Commonly known as:  ZOLOFT  Take 100 mg by mouth daily.     Vitamin D3 10000 UNITS Tabs  Take 10,000 Units by mouth daily.         Signed: West Pugh. Jashiya Bassett   PA-C  07/20/2015, 7:45 AM

## 2015-07-31 ENCOUNTER — Ambulatory Visit: Payer: Self-pay | Admitting: Physical Therapy

## 2015-08-01 ENCOUNTER — Ambulatory Visit (INDEPENDENT_AMBULATORY_CARE_PROVIDER_SITE_OTHER): Payer: Medicare Other | Admitting: Physical Therapy

## 2015-08-01 ENCOUNTER — Encounter: Payer: Self-pay | Admitting: Physical Therapy

## 2015-08-01 DIAGNOSIS — R269 Unspecified abnormalities of gait and mobility: Secondary | ICD-10-CM

## 2015-08-01 DIAGNOSIS — R2240 Localized swelling, mass and lump, unspecified lower limb: Secondary | ICD-10-CM

## 2015-08-01 DIAGNOSIS — M25661 Stiffness of right knee, not elsewhere classified: Secondary | ICD-10-CM

## 2015-08-01 DIAGNOSIS — R29898 Other symptoms and signs involving the musculoskeletal system: Secondary | ICD-10-CM

## 2015-08-01 NOTE — Patient Instructions (Signed)
Achilles / Gastroc, Standing    Stand, right foot behind, heel on floor and turned slightly out, leg straight, forward leg bent. Move hips forward. Hold _45__ seconds. Repeat _2__ times per session. Do _3-4__ sessions per day.   Balance: Unilateral - do next to a counter for safety    Attempt to balance on left leg, eyes open. Hold _as long as able___ seconds. Repeat _5-10___ times per set. Do __1__ sets per session. Do __3-4__ sessions per day. Perform exercise with eyes closed.  Copyright  VHI. All rights reserved.

## 2015-08-01 NOTE — Therapy (Signed)
[place], [place], [address] Phone: [phone]   Fax:  [phone]  Physical Therapy Evaluation  Patient Details  Name: Shelley Perez MRN: [id number] Date of Birth: February 19, 1951 Referring Provider: Dr Salli Quarry  Encounter Date: 08/01/2015      PT End of Session - 08/01/15 1445    Visit Number 1   Number of Visits 12   Date for PT Re-Evaluation 09/12/15   PT Start Time 1408   PT Stop Time 1510   PT Time Calculation (min) 62 min   Activity Tolerance Patient tolerated treatment well      Past Medical History  Diagnosis Date  . Depression   . Insomnia   . Fibromyalgia   . Hyperlipidemia   . Complication of anesthesia     "they gave me too much and I had to stay 3 days " 2005 after choley   . Asthma   . COPD (chronic obstructive pulmonary disease) ([place])   . Arthritis   . Osteoporosis   . History of kidney stones   . Hx of pancreatitis     with gallstones  . Anemia   . Anxiety     Past Surgical History  Procedure Laterality Date  . Knee surgery  1978 / 1971  . C sections    . Cholecystectomy    . Foot surgery    . Shoulder surgery    . Dental surgery    . Total knee arthroplasty Right 07/10/2015    Procedure: RIGHT TOTAL KNEE ARTHROPLASTY;  Surgeon: Paralee Cancel, MD;  Location: [facility];  Service: Orthopedics;  Laterality: Right;    There were no vitals filed for this visit.  Visit Diagnosis:  Stiffness of knee joint, right - Plan: PT plan of care cert/re-cert  Weakness of right leg - Plan: PT plan of care cert/re-cert  Localized swelling of lower leg - Plan: PT plan of care cert/re-cert  Abnormality of gait - Plan: PT plan of care cert/re-cert      Subjective Assessment - 08/01/15 1413    Subjective Pt reports progressive Rt knee pain, had elective TKA, used a RW initially now on a SPC. Doesn't use a brace.    Pertinent History s/p two scopes last one in 2000   How long  can you sit comfortably? 45 minutes   How long can you stand comfortably? tolerates to make a small meal   How long can you walk comfortably? with SPC, attempted going to a store however was very difficult   Diagnostic tests x-rays   Patient Stated Goals get rid of leg pain, bend her knee more, walk without a cane, drive   Currently in Pain? Yes   Pain Score 5    Pain Location Knee   Pain Orientation Right   Pain Descriptors / Indicators Shooting;Aching;Tightness  hot   Pain Type Surgical pain   Pain Onset 1 to 4 weeks ago   Pain Frequency Constant   Aggravating Factors  staying up on the knee to long   Pain Relieving Factors medication, elevation, ice            [facility] PT Assessment - 08/01/15 0001    Assessment   Medical Diagnosis Rt TKA   Referring Provider Dr Salli Quarry   Onset Date/Surgical Date 07/10/15   Next MD Visit 08/25/15   Prior Therapy HHPT, for a few visits   Precautions   Precautions --  no driving, limit lifting.  Restrictions   Weight Bearing Restrictions --  WBAT   Balance Screen   Has the patient fallen in the past 6 months No   Has the patient had a decrease in activity level because of a fear of falling?  No   Is the patient reluctant to leave their home because of a fear of falling?  No   Home Ecologist residence   Living Arrangements Other relatives   Home Access Stairs to enter   Entrance Stairs-Number of Steps 4   Prior Function   Level of Independence Independent  help with house work and shopping   Vocation Retired   Leisure not many hobbies   Observation/Other Assessments   Focus on Therapeutic Outcomes (FOTO)  74% limited   Functional Tests   Functional tests Single leg stance   Single Leg Stance   Comments Rt 4 sec   Posture/Postural Control   Posture Comments healing incision    ROM / Strength   AROM / PROM / Strength AROM;Strength;PROM   AROM   AROM Assessment Site Knee   Right/Left Knee Right;Left    Right Knee Extension -5   Right Knee Flexion 90   Left Knee Extension -1   Left Knee Flexion 132   PROM   PROM Assessment Site Knee   Right/Left Knee Right   Right Knee Extension -5   Right Knee Flexion 95   Strength   Strength Assessment Site Hip;Knee   Right/Left Hip Right;Left   Right Hip Flexion 4/5   Right Hip Extension 3+/5   Right Hip ABduction 3+/5   Left Hip Flexion 5/5   Left Hip ABduction 5/5   Right/Left Knee Right;Left   Right Knee Flexion 4/5   Right Knee Extension 4-/5   Left Knee Flexion 5/5   Left Knee Extension 5/5   Flexibility   Soft Tissue Assessment /Muscle Length --  tightness in quad   Palpation   Patella mobility hypomobile                   [facility] Adult PT Treatment/Exercise - 08/01/15 0001    Exercises   Exercises Knee/Hip   Knee/Hip Exercises: Stretches   Gastroc Stretch Right;2 reps;30 seconds   Knee/Hip Exercises: Aerobic   Stationary Bike for ROM x 5'   Modalities   Modalities Designer, multimedia Location Rt knee   Risk analyst Stimulation Parameters to tolerance   Electrical Stimulation Goals Edema   Vasopneumatic   Number Minutes Vasopneumatic  15 minutes   Vasopnuematic Location  Knee  Rt   Vasopneumatic Pressure Medium   Vasopneumatic Temperature  3*                PT Education - 08/01/15 1445    Education provided Yes   Education Details HEP   Person(s) Educated Patient   Methods Demonstration;Handout;Explanation   Comprehension Returned demonstration;Verbalized understanding          PT Short Term Goals - 08/01/15 1459    PT SHORT TERM GOAL #1   Title I with initial HEP ( 08/22/15)    Time 3   Period Weeks   Status New   PT SHORT TERM GOAL #2   Title increase Rt knee extension =/< -2 ( 08/22/15)    Time 3   Period Weeks   Status New   PT SHORT TERM GOAL #3   Title  increase Rt knee  flexion =/> 100 degrees ( 08/22/15)    Time 3   Period Weeks   Status New   PT SHORT TERM GOAL #4   Title ambulate in the house without an assistive device ( 08/22/15)    PT SHORT TERM GOAL #5   Time 3   Period Weeks   Status New           PT Long Term Goals - 08/01/15 1500    PT LONG TERM GOAL #1   Title I with advanced HEP ( 09/12/15)    Time 6   Period Weeks   Status New   PT LONG TERM GOAL #2   Title demo Rt knee flexion =/> 115 degrees ( 09/12/15)    Time 6   Period Weeks   Status New   PT LONG TERM GOAL #3   Title increase strength Rt LE =/> 5-/5 ( 09/12/15)    Time 4   Period Weeks   Status New   PT LONG TERM GOAL #4   Title ambulate on even and uneven surfaces without assistive device and no LOB ( 09/12/15)    Time 6   Period Weeks   Status New   PT LONG TERM GOAL #5   Title improve FOTO =/< 50% limited, CK level ( 09/12/15)    Time 6   Period Weeks   Status New               Plan - 08/01/15 1456    Clinical Impression Statement 64 yo female 3 wks s/p elective Rt TKA, she had HHPT for a few visits and presents here to finish up her PT.  She has decrease strength & proprioception in the Rt LE along with decreased ROM, gait abnormality and edema. She wishes for pain reduction and restoration of moblity as she needs to have the Lt knee replaced also.    Pt will benefit from skilled therapeutic intervention in order to improve on the following deficits Increased edema;Decreased strength;Decreased mobility;Decreased balance;Hypomobility;Pain;Difficulty walking;Decreased range of motion;Abnormal gait   Rehab Potential Excellent   PT Frequency 2x / week   PT Duration 6 weeks   PT Treatment/Interventions Ultrasound;Balance training;Neuromuscular re-education;Gait training;Patient/family education;Passive range of motion;Cryotherapy;Stair training;Dry needling;Electrical Stimulation;Moist Heat;Therapeutic exercise;Manual techniques;Vasopneumatic Device   PT Next Visit  Plan progress knee ROM, standing activities and edema reduction .    Consulted and Agree with Plan of Care Patient         Problem List Patient Active Problem List   Diagnosis Date Noted  . Obese 07/11/2015  . S/P right TKA 07/10/2015  . SCABIES 04/19/2011  . ANXIETY 04/19/2011    Jeral Pinch PT 08/01/2015, 3:04 PM  [facility] [place], [place], [address] Phone: [phone]   Fax:  [phone]  Name: Delayah Lou MRN: [id number] Date of Birth: 1951-03-05

## 2015-08-06 ENCOUNTER — Ambulatory Visit (INDEPENDENT_AMBULATORY_CARE_PROVIDER_SITE_OTHER): Payer: Medicare Other | Admitting: Physical Therapy

## 2015-08-06 ENCOUNTER — Encounter (INDEPENDENT_AMBULATORY_CARE_PROVIDER_SITE_OTHER): Payer: Self-pay

## 2015-08-06 DIAGNOSIS — R29898 Other symptoms and signs involving the musculoskeletal system: Secondary | ICD-10-CM

## 2015-08-06 DIAGNOSIS — M25661 Stiffness of right knee, not elsewhere classified: Secondary | ICD-10-CM | POA: Diagnosis not present

## 2015-08-06 DIAGNOSIS — R269 Unspecified abnormalities of gait and mobility: Secondary | ICD-10-CM | POA: Diagnosis not present

## 2015-08-06 DIAGNOSIS — R2240 Localized swelling, mass and lump, unspecified lower limb: Secondary | ICD-10-CM

## 2015-08-06 NOTE — Therapy (Signed)
[place], [place], [address] Phone: [phone]   Fax:  [phone]  Physical Therapy Treatment  Patient Details  Name: Shelley Perez MRN: [id number] Date of Birth: 11-15-1950 Referring Provider: Dr. Alvan Dame  Encounter Date: 08/06/2015      PT End of Session - 08/06/15 1434    Visit Number 2   Number of Visits 12   Date for PT Re-Evaluation 09/12/15   PT Start Time 1432   PT Stop Time 1526   PT Time Calculation (min) 54 min   Activity Tolerance Patient tolerated treatment well      Past Medical History  Diagnosis Date  . Depression   . Insomnia   . Fibromyalgia   . Hyperlipidemia   . Complication of anesthesia     "they gave me too much and I had to stay 3 days " 2005 after choley   . Asthma   . COPD (chronic obstructive pulmonary disease) ([place])   . Arthritis   . Osteoporosis   . History of kidney stones   . Hx of pancreatitis     with gallstones  . Anemia   . Anxiety     Past Surgical History  Procedure Laterality Date  . Knee surgery  1978 / 1971  . C sections    . Cholecystectomy    . Foot surgery    . Shoulder surgery    . Dental surgery    . Total knee arthroplasty Right 07/10/2015    Procedure: RIGHT TOTAL KNEE ARTHROPLASTY;  Surgeon: Paralee Cancel, MD;  Location: [facility];  Service: Orthopedics;  Laterality: Right;    There were no vitals filed for this visit.  Visit Diagnosis:  Stiffness of knee joint, right  Weakness of right leg  Localized swelling of lower leg  Abnormality of gait      Subjective Assessment - 08/06/15 1434    Subjective Pt ambulates with SPC.  Pt reports there are no new changes since last visit.    Currently in Pain? Yes   Pain Score 5    Pain Location Knee   Pain Orientation Right   Pain Descriptors / Indicators Aching   Aggravating Factors  walking, standing prolonged    Pain Relieving Factors ice, elevation, medicine             [facility] PT Assessment - 08/06/15 0001    Assessment   Medical Diagnosis Rt TKA   Referring Provider Dr. Alvan Dame   Onset Date/Surgical Date 07/10/15   Next MD Visit 08/25/15   AROM   Right/Left Knee Right   Right Knee Extension -5   Right Knee Flexion 94           [facility] Adult PT Treatment/Exercise - 08/06/15 0001    Self-Care   Self-Care Scar Mobilizations;Other Self-Care Comments   Scar Mobilizations Pt instructed in scar mobilization and desensitization; pt verbalized understandnign   Knee/Hip Exercises: Aerobic   Stationary Bike partial revolutions for ROM x 5'   Knee/Hip Exercises: Seated   Other Seated Knee/Hip Exercises seated scoots for increased Rt knee ROM 5 sec hold x 10 reps    Knee/Hip Exercises: Supine   Quad Sets Right;1 set;10 reps  5 sec hold, heel propped   Heel Slides --  2 reps for measurement   Straight Leg Raises Right;1 set;5 reps   Knee/Hip Exercises: Sidelying   Hip ABduction Strengthening;Right;2 sets;10 reps   Knee/Hip Exercises: Prone   Hip Extension Right;2 sets;10  reps   Prone Knee Hang 1 minute   Modalities   Modalities Designer, multimedia Location Rt knee   Agricultural consultant Stimulation Parameters to tolerance    Electrical Stimulation Goals Edema;Pain   Vasopneumatic   Number Minutes Vasopneumatic  15 minutes   Vasopnuematic Location  Knee  Rt   Vasopneumatic Pressure Medium   Vasopneumatic Temperature  3*                  PT Short Term Goals - 08/01/15 1459    PT SHORT TERM GOAL #1   Title I with initial HEP ( 08/22/15)    Time 3   Period Weeks   Status New   PT SHORT TERM GOAL #2   Title increase Rt knee extension =/< -2 ( 08/22/15)    Time 3   Period Weeks   Status New   PT SHORT TERM GOAL #3   Title increase Rt knee flexion =/> 100 degrees ( 08/22/15)    Time 3   Period Weeks   Status New   PT SHORT TERM GOAL  #4   Title ambulate in the house without an assistive device ( 08/22/15)    PT SHORT TERM GOAL #5   Time 3   Period Weeks   Status New           PT Long Term Goals - 08/01/15 1500    PT LONG TERM GOAL #1   Title I with advanced HEP ( 09/12/15)    Time 6   Period Weeks   Status New   PT LONG TERM GOAL #2   Title demo Rt knee flexion =/> 115 degrees ( 09/12/15)    Time 6   Period Weeks   Status New   PT LONG TERM GOAL #3   Title increase strength Rt LE =/> 5-/5 ( 09/12/15)    Time 4   Period Weeks   Status New   PT LONG TERM GOAL #4   Title ambulate on even and uneven surfaces without assistive device and no LOB ( 09/12/15)    Time 6   Period Weeks   Status New   PT LONG TERM GOAL #5   Title improve FOTO =/< 50% limited, CK level ( 09/12/15)    Time 6   Period Weeks   Status New               Plan - 08/06/15 1513    Clinical Impression Statement Pt demonstrated improvement in Rt knee flexion ROM, remains limited in Rt knee extension ROM.  Pt hypersensitive to touch near incision on Rt knee; educated on technique for scar massage.  No goals met yet; only second visit.  Will benefit from continued PT intervention to maximize functional mobility.    Pt will benefit from skilled therapeutic intervention in order to improve on the following deficits Increased edema;Decreased strength;Decreased mobility;Decreased balance;Hypomobility;Pain;Difficulty walking;Decreased range of motion;Abnormal gait   Rehab Potential Excellent   PT Frequency 2x / week   PT Duration 6 weeks   PT Treatment/Interventions Ultrasound;Balance training;Neuromuscular re-education;Gait training;Patient/family education;Passive range of motion;Cryotherapy;Stair training;Dry needling;Electrical Stimulation;Moist Heat;Therapeutic exercise;Manual techniques;Vasopneumatic Device   PT Next Visit Plan progress knee ROM, standing activities and edema reduction .    Consulted and Agree with Plan of Care Patient         Problem List Patient Active Problem List   Diagnosis Date Noted  .  Obese 07/11/2015  . S/P right TKA 07/10/2015  . SCABIES 04/19/2011  . ANXIETY 04/19/2011    Kerin Perna, PTA 08/06/2015 3:17 PM   [facility] [place], [place], [address] Phone: [phone]   Fax:  [phone]  Name: Shelley Perez MRN: [id number] Date of Birth: Dec 24, 1950

## 2015-08-08 ENCOUNTER — Ambulatory Visit (INDEPENDENT_AMBULATORY_CARE_PROVIDER_SITE_OTHER): Payer: Medicare Other | Admitting: Physical Therapy

## 2015-08-08 DIAGNOSIS — R29898 Other symptoms and signs involving the musculoskeletal system: Secondary | ICD-10-CM

## 2015-08-08 DIAGNOSIS — R2240 Localized swelling, mass and lump, unspecified lower limb: Secondary | ICD-10-CM

## 2015-08-08 DIAGNOSIS — R269 Unspecified abnormalities of gait and mobility: Secondary | ICD-10-CM | POA: Diagnosis not present

## 2015-08-08 DIAGNOSIS — M25661 Stiffness of right knee, not elsewhere classified: Secondary | ICD-10-CM

## 2015-08-08 NOTE — Therapy (Signed)
[place], [place], [address] Phone: [phone]   Fax:  [phone]  Physical Therapy Treatment  Patient Details  Name: Shelley Perez MRN: [id number] Date of Birth: 1950/12/14 Referring Provider: Dr. Alvan Dame  Encounter Date: 08/08/2015      PT End of Session - 08/08/15 1443    Visit Number 3   Number of Visits 12   Date for PT Re-Evaluation 09/12/15   PT Start Time 1435   PT Stop Time 1539   PT Time Calculation (min) 64 min   Activity Tolerance Patient tolerated treatment well      Past Medical History  Diagnosis Date  . Depression   . Insomnia   . Fibromyalgia   . Hyperlipidemia   . Complication of anesthesia     "they gave me too much and I had to stay 3 days " 2005 after choley   . Asthma   . COPD (chronic obstructive pulmonary disease) ([place])   . Arthritis   . Osteoporosis   . History of kidney stones   . Hx of pancreatitis     with gallstones  . Anemia   . Anxiety     Past Surgical History  Procedure Laterality Date  . Knee surgery  1978 / 1971  . C sections    . Cholecystectomy    . Foot surgery    . Shoulder surgery    . Dental surgery    . Total knee arthroplasty Right 07/10/2015    Procedure: RIGHT TOTAL KNEE ARTHROPLASTY;  Surgeon: Paralee Cancel, MD;  Location: [facility];  Service: Orthopedics;  Laterality: Right;    There were no vitals filed for this visit.  Visit Diagnosis:  Stiffness of knee joint, right  Weakness of right leg  Localized swelling of lower leg  Abnormality of gait      Subjective Assessment - 08/08/15 1444    Subjective Pt reports she tried prone hang exercise, but knee was really painful after that.  Rt knee is waking her up at night, using ice to calm it down.   (Rt knee presents slightly pink around incision and posterior knee- supervising PT present and aware)   Currently in Pain? Yes   Pain Score 3    Pain Orientation Right   Pain  Descriptors / Indicators Aching   Aggravating Factors  prolonged standing    Pain Relieving Factors ice, elevation, medicine            [facility] PT Assessment - 08/08/15 0001    Assessment   Medical Diagnosis Rt TKA   Referring Provider Dr. Alvan Dame   Onset Date/Surgical Date 07/10/15   Next MD Visit 08/25/15           [facility] Adult PT Treatment/Exercise - 08/08/15 0001    Ambulation/Gait   Ambulation Distance (Feet) 400 Feet   Assistive device Straight cane   Gait Pattern Decreased arm swing - right;Decreased stance time - right;Decreased dorsiflexion - right;Decreased dorsiflexion - left;Right flexed knee in stance   Pre-Gait Activities Standing in staggered stance, Rt knee flexion and flexed foot (toe off position) x 10-12 reps.       Gait Comments Hand over hand (RUE) to improve Rt arm swing; minimal carry over.  VC for decreased Rt step length, increased DF bilat and toe off RLE; improved with repetition and post cues.  Trial with increased speed; good carry over of improved pattern and step length.     Knee/Hip Exercises:  Stretches   Passive Hamstring Stretch Right;1 rep;30 seconds  seated    Gastroc Stretch Right;Left;3 reps;30 seconds   Knee/Hip Exercises: Aerobic   Nustep L4: 7 min   partial range initially.    Knee/Hip Exercises: Standing   Forward Step Up Right;1 set;10 reps;Hand Hold: 2   Step Down Left;1 set;10 reps;Hand Hold: 2  3" step    Knee/Hip Exercises: Seated   Other Seated Knee/Hip Exercises seated scoots for increased Rt knee ROM 5 sec hold x 12 reps    Knee/Hip Exercises: Prone   Prone Knee Hang 2 minutes   Modalities   Modalities Electrical Stimulation;Vasopneumatic   Electrical Stimulation   Electrical Stimulation Location Rt knee    Electrical Stimulation Action ion repelling   Electrical Stimulation Parameters to tolerance    Electrical Stimulation Goals Edema;Pain   Vasopneumatic   Number Minutes Vasopneumatic  15 minutes   Vasopnuematic Location   Knee  Rt   Vasopneumatic Pressure Medium   Vasopneumatic Temperature  3*           PT Short Term Goals - 08/01/15 1459    PT SHORT TERM GOAL #1   Title I with initial HEP ( 08/22/15)    Time 3   Period Weeks   Status New   PT SHORT TERM GOAL #2   Title increase Rt knee extension =/< -2 ( 08/22/15)    Time 3   Period Weeks   Status New   PT SHORT TERM GOAL #3   Title increase Rt knee flexion =/> 100 degrees ( 08/22/15)    Time 3   Period Weeks   Status New   PT SHORT TERM GOAL #4   Title ambulate in the house without an assistive device ( 08/22/15)    PT SHORT TERM GOAL #5   Time 3   Period Weeks   Status New           PT Long Term Goals - 08/01/15 1500    PT LONG TERM GOAL #1   Title I with advanced HEP ( 09/12/15)    Time 6   Period Weeks   Status New   PT LONG TERM GOAL #2   Title demo Rt knee flexion =/> 115 degrees ( 09/12/15)    Time 6   Period Weeks   Status New   PT LONG TERM GOAL #3   Title increase strength Rt LE =/> 5-/5 ( 09/12/15)    Time 4   Period Weeks   Status New   PT LONG TERM GOAL #4   Title ambulate on even and uneven surfaces without assistive device and no LOB ( 09/12/15)    Time 6   Period Weeks   Status New   PT LONG TERM GOAL #5   Title improve FOTO =/< 50% limited, CK level ( 09/12/15)    Time 6   Period Weeks   Status New               Plan - 08/08/15 1534    Clinical Impression Statement Pt tolerated all exercises with mild increase in Rt knee pain.  This pain was decreased with use of estim/vaso at end of session.  Pt's gait quality improved with frequent VC and repetition.  Gradually progressing towards goals. Will benefit from conitnued PT intervention to maximize safety and functional mobility.    Pt will benefit from skilled therapeutic intervention in order to improve on the following deficits Increased edema;Decreased strength;Decreased mobility;Decreased balance;Hypomobility;Pain;Difficulty  walking;Decreased range of  motion;Abnormal gait   Rehab Potential Excellent   PT Frequency 2x / week   PT Duration 6 weeks   PT Treatment/Interventions Ultrasound;Balance training;Neuromuscular re-education;Gait training;Patient/family education;Passive range of motion;Cryotherapy;Stair training;Dry needling;Electrical Stimulation;Moist Heat;Therapeutic exercise;Manual techniques;Vasopneumatic Device   PT Next Visit Plan progress knee ROM, standing activities and edema reduction .    Consulted and Agree with Plan of Care Patient        Problem List Patient Active Problem List   Diagnosis Date Noted  . Obese 07/11/2015  . S/P right TKA 07/10/2015  . SCABIES 04/19/2011  . ANXIETY 04/19/2011    Kerin Perna, PTA 08/08/2015 3:47 PM  [place], [place], [address] Phone: [phone]   Fax:  [phone]  Name: Shelley Perez MRN: [id number] Date of Birth: 06/04/1951

## 2015-08-15 ENCOUNTER — Ambulatory Visit (INDEPENDENT_AMBULATORY_CARE_PROVIDER_SITE_OTHER): Payer: Medicare Other | Admitting: Physical Therapy

## 2015-08-15 DIAGNOSIS — R269 Unspecified abnormalities of gait and mobility: Secondary | ICD-10-CM

## 2015-08-15 DIAGNOSIS — M25661 Stiffness of right knee, not elsewhere classified: Secondary | ICD-10-CM

## 2015-08-15 DIAGNOSIS — R29898 Other symptoms and signs involving the musculoskeletal system: Secondary | ICD-10-CM | POA: Diagnosis not present

## 2015-08-15 DIAGNOSIS — R2240 Localized swelling, mass and lump, unspecified lower limb: Secondary | ICD-10-CM

## 2015-08-15 NOTE — Therapy (Signed)
[place], [place], [address] Phone: [phone]   Fax:  [phone]  Physical Therapy Treatment  Patient Details  Name: Shelley Perez MRN: [id number] Date of Birth: 08-26-1950 Referring Provider: Dr. Alvan Dame  Encounter Date: 08/15/2015      PT End of Session - 08/15/15 1530    Visit Number 4   Number of Visits 12   Date for PT Re-Evaluation 09/12/15   PT Start Time 2878  pt arrived late   PT Stop Time 1615   PT Time Calculation (min) 54 min      Past Medical History  Diagnosis Date  . Depression   . Insomnia   . Fibromyalgia   . Hyperlipidemia   . Complication of anesthesia     "they gave me too much and I had to stay 3 days " 2005 after choley   . Asthma   . COPD (chronic obstructive pulmonary disease) ([place])   . Arthritis   . Osteoporosis   . History of kidney stones   . Hx of pancreatitis     with gallstones  . Anemia   . Anxiety     Past Surgical History  Procedure Laterality Date  . Knee surgery  1978 / 1971  . C sections    . Cholecystectomy    . Foot surgery    . Shoulder surgery    . Dental surgery    . Total knee arthroplasty Right 07/10/2015    Procedure: RIGHT TOTAL KNEE ARTHROPLASTY;  Surgeon: Paralee Cancel, MD;  Location: [facility];  Service: Orthopedics;  Laterality: Right;    There were no vitals filed for this visit.  Visit Diagnosis:  Stiffness of knee joint, right  Weakness of right leg  Localized swelling of lower leg  Abnormality of gait      Subjective Assessment - 08/15/15 1531    Subjective Pt reports she feels she can move her (Rt) leg more and feels she is able to bend her knee more with gait.  Pt reports she feels her leg feels a little more sore than she ever has. Pt now taking pain pill before bed, she is sleeping better.    Currently in Pain? Yes   Pain Score 4    Pain Location Knee   Pain Orientation Right   Pain Descriptors / Indicators  Aching   Aggravating Factors  prolonged standing   Pain Relieving Factors ice, elevation, medication.             [facility] PT Assessment - 08/15/15 0001    Assessment   Medical Diagnosis Rt TKA   Referring Provider Dr. Alvan Dame   Onset Date/Surgical Date 07/10/15   Next MD Visit 08/25/15   AROM   Right/Left Knee Right   Right Knee Extension -4  with quad set   Right Knee Flexion 100  seated, with scoot          [facility] Adult PT Treatment/Exercise - 08/15/15 0001    Ambulation/Gait   Ambulation Distance (Feet) 240 Feet   Assistive device None   Gait Pattern Decreased arm swing - right;Decreased stance time - right;Decreased weight shift to right   Pre-Gait Activities Rt knee flex / ext in staggered stance   Gait Comments VC for increased Rt knee bend in swing through, decrease Rt step length, increase Rt toe off.  Improved with repetition and VC   Knee/Hip Exercises: Stretches   Passive Hamstring Stretch Right;1 rep;30 seconds  seated  Gastroc Stretch Right;Left;3 reps;30 seconds   Knee/Hip Exercises: Aerobic   Nustep L3: 6 min   partial range initially.    Knee/Hip Exercises: Seated   Long Arc Quad Strengthening;Right;1 set;10 reps   Long Arc Quad Weight 2 lbs.   Other Seated Knee/Hip Exercises seated scoots for increased Rt knee ROM 5 sec hold x 10 reps    Knee/Hip Exercises: Supine   Quad Sets Right;1 set;10 reps  5 sec hold, heel propped   Modalities   Modalities Vasopneumatic   Vasopneumatic   Number Minutes Vasopneumatic  15 minutes   Vasopnuematic Location  Knee  Rt   Vasopneumatic Pressure Medium   Vasopneumatic Temperature  3*   Manual Therapy   Manual Therapy Soft tissue mobilization;Taping;Passive ROM   Manual therapy comments Kinesiotape applied in web pattern to Rt anterior knee to assist with edema and pain reduction.    Soft tissue mobilization Edge tool assistance for decreased fascial restrictions to Rt quad.    Passive ROM Rt knee passive extension 30  sec x 4 reps to tolerance                   PT Short Term Goals - 08/01/15 1459    PT SHORT TERM GOAL #1   Title I with initial HEP ( 08/22/15)    Time 3   Period Weeks   Status New   PT SHORT TERM GOAL #2   Title increase Rt knee extension =/< -2 ( 08/22/15)    Time 3   Period Weeks   Status New   PT SHORT TERM GOAL #3   Title increase Rt knee flexion =/> 100 degrees ( 08/22/15)    Time 3   Period Weeks   Status Achieved   PT SHORT TERM GOAL #4   Title ambulate in the house without an assistive device ( 08/22/15)    PT SHORT TERM GOAL #5   Time 3   Period Weeks   Status New           PT Long Term Goals - 08/01/15 1500    PT LONG TERM GOAL #1   Title I with advanced HEP ( 09/12/15)    Time 6   Period Weeks   Status New   PT LONG TERM GOAL #2   Title demo Rt knee flexion =/> 115 degrees ( 09/12/15)    Time 6   Period Weeks   Status New   PT LONG TERM GOAL #3   Title increase strength Rt LE =/> 5-/5 ( 09/12/15)    Time 4   Period Weeks   Status New   PT LONG TERM GOAL #4   Title ambulate on even and uneven surfaces without assistive device and no LOB ( 09/12/15)    Time 6   Period Weeks   Status New   PT LONG TERM GOAL #5   Title improve FOTO =/< 50% limited, CK level ( 09/12/15)    Time 6   Period Weeks   Status New               Plan - 08/15/15 1709    Clinical Impression Statement Pt demonstrated improved Rt knee flexion ROM; has met STG #3.  Pt's gait quality continues to improve with VC and repetition. Pain in knee decreased with use of vaso at end of session.     Pt will benefit from skilled therapeutic intervention in order to improve on the following deficits Increased edema;Decreased  strength;Decreased mobility;Decreased balance;Hypomobility;Pain;Difficulty walking;Decreased range of motion;Abnormal gait   Rehab Potential Excellent   PT Frequency 2x / week   PT Duration 6 weeks   PT Treatment/Interventions Ultrasound;Balance  training;Neuromuscular re-education;Gait training;Patient/family education;Passive range of motion;Cryotherapy;Stair training;Dry needling;Electrical Stimulation;Moist Heat;Therapeutic exercise;Manual techniques;Vasopneumatic Device   PT Next Visit Plan Continue progressive Rt knee ROM and strengthening; assess response to kinesiotape.     Consulted and Agree with Plan of Care Patient        Problem List Patient Active Problem List   Diagnosis Date Noted  . Obese 07/11/2015  . S/P right TKA 07/10/2015  . SCABIES 04/19/2011  . ANXIETY 04/19/2011   Kerin Perna, PTA 08/15/2015 5:14 PM  [place], [place], [address] Phone: [phone]   Fax:  [phone]  Name: Margorie Renner MRN: [id number] Date of Birth: May 26, 1951

## 2015-08-17 ENCOUNTER — Ambulatory Visit (INDEPENDENT_AMBULATORY_CARE_PROVIDER_SITE_OTHER): Payer: Medicare Other | Admitting: Physical Therapy

## 2015-08-17 DIAGNOSIS — M25661 Stiffness of right knee, not elsewhere classified: Secondary | ICD-10-CM

## 2015-08-17 DIAGNOSIS — R2240 Localized swelling, mass and lump, unspecified lower limb: Secondary | ICD-10-CM | POA: Diagnosis not present

## 2015-08-17 DIAGNOSIS — R269 Unspecified abnormalities of gait and mobility: Secondary | ICD-10-CM

## 2015-08-17 DIAGNOSIS — R29898 Other symptoms and signs involving the musculoskeletal system: Secondary | ICD-10-CM | POA: Diagnosis not present
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
## 2015-08-21 ENCOUNTER — Ambulatory Visit (INDEPENDENT_AMBULATORY_CARE_PROVIDER_SITE_OTHER): Payer: Medicare Other | Admitting: Physical Therapy

## 2015-08-21 DIAGNOSIS — R2240 Localized swelling, mass and lump, unspecified lower limb: Secondary | ICD-10-CM | POA: Diagnosis not present

## 2015-08-21 DIAGNOSIS — R29898 Other symptoms and signs involving the musculoskeletal system: Secondary | ICD-10-CM | POA: Diagnosis not present

## 2015-08-21 DIAGNOSIS — M25661 Stiffness of right knee, not elsewhere classified: Secondary | ICD-10-CM

## 2015-08-21 NOTE — Therapy (Signed)
[place], [place], [address] Phone: [phone]   Fax:  [phone]  Physical Therapy Treatment  Patient Details  Name: Shelley Perez MRN: [id number] Date of Birth: [birth year] Referring Provider: Dr. Alvan Dame  Encounter Date: 08/21/2015      PT End of Session - 08/21/15 1300    Visit Number 6   Number of Visits 12   Date for PT Re-Evaluation 09/12/15   PT Start Time 1152   PT Stop Time 1248   PT Time Calculation (min) 56 min   Activity Tolerance Patient tolerated treatment well      Past Medical History  Diagnosis Date  . Depression   . Insomnia   . Fibromyalgia   . Hyperlipidemia   . Complication of anesthesia     "they gave me too much and I had to stay 3 days " 2005 after choley   . Asthma   . COPD (chronic obstructive pulmonary disease) ([place])   . Arthritis   . Osteoporosis   . History of kidney stones   . Hx of pancreatitis     with gallstones  . Anemia   . Anxiety     Past Surgical History  Procedure Laterality Date  . Knee surgery  1978 / 1971  . C sections    . Cholecystectomy    . Foot surgery    . Shoulder surgery    . Dental surgery    . Total knee arthroplasty Right 07/10/2015    Procedure: RIGHT TOTAL KNEE ARTHROPLASTY;  Surgeon: Paralee Cancel, MD;  Location: [facility];  Service: Orthopedics;  Laterality: Right;    There were no vitals filed for this visit.  Visit Diagnosis:  Stiffness of knee joint, right  Weakness of right leg  Localized swelling of lower leg      Subjective Assessment - 08/21/15 1258    Subjective Pt reports no new changes.     Currently in Pain? Yes   Pain Score 2    Pain Location Knee   Pain Orientation Right;Posterior;Anterior   Pain Descriptors / Indicators Aching   Aggravating Factors  prolonged standing, stretching of knee    Pain Relieving Factors ice, elevation, medication            [facility] PT Assessment - 08/21/15 0001    Assessment   Medical Diagnosis Rt TKA   Referring Provider Dr. Alvan Dame   Onset Date/Surgical Date 07/10/15   Next MD Visit 08/25/15   AROM   Right/Left Knee Right   Right Knee Extension -4  with quad set    Right Knee Flexion 100  with heel slide   Strength   Right/Left Hip Right   Right Hip Flexion 3+/5  pain in groin   Right Hip ABduction 4-/5  point tender in Lateral Rt thigh   Right/Left Knee Right   Right Knee Flexion 4+/5   Right Knee Extension 4+/5   Flexibility   Soft Tissue Assessment /Muscle Length yes   Quadriceps Prone quad stretch with strap RLE ~60-66   ITB 5 deg knee flexion            [facility] Adult PT Treatment/Exercise - 08/21/15 0001    Knee/Hip Exercises: Stretches   Passive Hamstring Stretch Right;Left;2 reps;30 seconds   Quad Stretch Right;3 reps;30 seconds  prone with strap   Gastroc Stretch Right;Left;3 reps;30 seconds   Other Knee/Hip Stretches Forward Rt lunge with foot on 13" step and UE support (to tolerance,  to increase Rt knee flexion) followed by Rt hamstring stretch x 10 reps    Knee/Hip Exercises: Aerobic   Other Aerobic Laps around gym (equipment occupied) x 6, without AD.     Knee/Hip Exercises: Supine   Quad Sets Right;1 set;10 reps  5 sec hold, heel propped   Heel Slides --  2 reps for measurement   Knee/Hip Exercises: Prone   Straight Leg Raises Limitations Prone TKE with toes tucked (RLE) x 4 sec hold x 10 reps    Modalities   Modalities Cryotherapy;Electrical Stimulation   Cryotherapy   Number Minutes Cryotherapy 15 Minutes   Cryotherapy Location Knee  Rt   Type of Cryotherapy Ice pack   Electrical Stimulation   Electrical Stimulation Location Rt knee   Electrical Stimulation Action ion repelling   Electrical Stimulation Parameters to tolerance   Electrical Stimulation Goals Edema;Pain   Manual Therapy   Manual Therapy Joint mobilization   Joint Mobilization patellar mobs in all directions; pt hypersensitive to touch to this  area.                   PT Short Term Goals - 08/21/15 1243    PT SHORT TERM GOAL #1   Title I with initial HEP ( 08/22/15)    Time 3   Period Weeks   Status Achieved   PT SHORT TERM GOAL #2   Title increase Rt knee extension =/< -2 ( 08/22/15)    Time 3   Period Weeks   Status On-going   PT SHORT TERM GOAL #3   Title increase Rt knee flexion =/> 100 degrees ( 08/22/15)    Time 3   Period Weeks   Status Achieved   PT SHORT TERM GOAL #4   Title ambulate in the house without an assistive device ( 08/22/15)    Time 3   Period Weeks   Status Achieved           PT Long Term Goals - 08/21/15 1244    PT LONG TERM GOAL #1   Title I with advanced HEP ( 09/12/15)    Time 6   Period Weeks   Status On-going   PT LONG TERM GOAL #2   Title demo Rt knee flexion =/> 115 degrees ( 09/12/15)    Time 6   Period Weeks   Status On-going   PT LONG TERM GOAL #3   Title increase strength Rt LE =/> 5-/5 ( 09/12/15)    Time 4   Period Weeks   Status On-going   PT LONG TERM GOAL #4   Title ambulate on even and uneven surfaces without assistive device and no LOB ( 09/12/15)    Time 6   Period Weeks   Status Partially Met   PT LONG TERM GOAL #5   Title improve FOTO =/< 50% limited, CK level ( 09/12/15)    Time 6   Period Weeks   Status On-going               Plan - 08/21/15 1249    Clinical Impression Statement Pt continues to be hypersensitive to touch in Rt anterior knee.  Pt is progressing with increased Rt knee flexion and improved Rt knee strength.  Pt has met STG #1,3,4. Progressing well towards remaining goals.    Pt will benefit from skilled therapeutic intervention in order to improve on the following deficits Increased edema;Decreased strength;Decreased mobility;Decreased balance;Hypomobility;Pain;Difficulty walking;Decreased range of motion;Abnormal gait   Rehab Potential  Excellent   PT Frequency 2x / week   PT Duration 6 weeks   PT Treatment/Interventions  Ultrasound;Balance training;Neuromuscular re-education;Gait training;Patient/family education;Passive range of motion;Cryotherapy;Stair training;Dry needling;Electrical Stimulation;Moist Heat;Therapeutic exercise;Manual techniques;Vasopneumatic Device   PT Next Visit Plan Continue progressive ROM and strengthening to Rt knee.     Consulted and Agree with Plan of Care Patient        Problem List Patient Active Problem List   Diagnosis Date Noted  . Obese 07/11/2015  . S/P right TKA 07/10/2015  . SCABIES 04/19/2011  . ANXIETY 04/19/2011    Kerin Perna, PTA 08/21/2015 1:01 PM  [place], [place], [address] Phone: [phone]   Fax:  [phone]  Name: Shelley Perez MRN: [id number] Date of Birth: [birth year]

## 2015-08-24 ENCOUNTER — Encounter: Payer: Medicare Other | Admitting: Physical Therapy

## 2015-08-28 ENCOUNTER — Encounter: Payer: Medicare Other | Admitting: Physical Therapy

## 2015-08-31 ENCOUNTER — Ambulatory Visit (INDEPENDENT_AMBULATORY_CARE_PROVIDER_SITE_OTHER): Payer: Medicare Other | Admitting: Physical Therapy

## 2015-08-31 ENCOUNTER — Encounter: Payer: Medicare Other | Admitting: Physical Therapy

## 2015-08-31 DIAGNOSIS — R29898 Other symptoms and signs involving the musculoskeletal system: Secondary | ICD-10-CM | POA: Diagnosis not present

## 2015-08-31 DIAGNOSIS — M25661 Stiffness of right knee, not elsewhere classified: Secondary | ICD-10-CM

## 2015-08-31 DIAGNOSIS — R2240 Localized swelling, mass and lump, unspecified lower limb: Secondary | ICD-10-CM | POA: Diagnosis not present
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
## 2015-09-05 ENCOUNTER — Ambulatory Visit (INDEPENDENT_AMBULATORY_CARE_PROVIDER_SITE_OTHER): Payer: Medicare Other | Admitting: Physical Therapy

## 2015-09-05 DIAGNOSIS — M25661 Stiffness of right knee, not elsewhere classified: Secondary | ICD-10-CM | POA: Diagnosis not present

## 2015-09-05 DIAGNOSIS — R29898 Other symptoms and signs involving the musculoskeletal system: Secondary | ICD-10-CM

## 2015-09-05 DIAGNOSIS — R269 Unspecified abnormalities of gait and mobility: Secondary | ICD-10-CM

## 2015-09-05 DIAGNOSIS — R2240 Localized swelling, mass and lump, unspecified lower limb: Secondary | ICD-10-CM | POA: Diagnosis not present

## 2015-09-05 NOTE — Therapy (Signed)
[facility]  [place], [place], [address] Phone: [phone]   Fax:  [phone]  Physical Therapy Treatment  Patient Details  Name: Shelley Perez MRN: [id number] Date of Birth: 01-Nov-1950 Referring Provider: Dr Alvan Dame  Encounter Date: 09/05/2015      PT End of Session - 09/05/15 1523    Visit Number 8   Number of Visits 12   Date for PT Re-Evaluation 09/12/15   PT Start Time 3154   PT Stop Time 1613   PT Time Calculation (min) 59 min   Activity Tolerance Patient tolerated treatment well      Past Medical History  Diagnosis Date  . Depression   . Insomnia   . Fibromyalgia   . Hyperlipidemia   . Complication of anesthesia     "they gave me too much and I had to stay 3 days " 2005 after choley   . Asthma   . COPD (chronic obstructive pulmonary disease) ([place])   . Arthritis   . Osteoporosis   . History of kidney stones   . Hx of pancreatitis     with gallstones  . Anemia   . Anxiety     Past Surgical History  Procedure Laterality Date  . Knee surgery  1978 / 1971  . C sections    . Cholecystectomy    . Foot surgery    . Shoulder surgery    . Dental surgery    . Total knee arthroplasty Right 07/10/2015    Procedure: RIGHT TOTAL KNEE ARTHROPLASTY;  Surgeon: Paralee Cancel, MD;  Location: [facility];  Service: Orthopedics;  Laterality: Right;    There were no vitals filed for this visit.  Visit Diagnosis:  Stiffness of knee joint, right  Weakness of right leg  Localized swelling of lower leg  Abnormality of gait      Subjective Assessment - 09/05/15 1524    Subjective Doing pretty good today, no real pain, does have tightness.    Currently in Pain? Yes   Pain Score 1    Pain Location Knee   Pain Orientation Right   Pain Descriptors / Indicators Dull   Pain Type Surgical pain   Pain Onset More than a month ago   Pain Frequency Intermittent            [facility] PT Assessment -  09/05/15 0001    Assessment   Medical Diagnosis Rt TKA   Referring Provider Dr Alvan Dame   Onset Date/Surgical Date 07/10/15   Next MD Visit 6 wks from last appt   AROM   Right/Left Knee Right   Right Knee Extension -2   Right Knee Flexion 108   Strength   Right Hip Flexion 4/5                     [facility] Adult PT Treatment/Exercise - 09/05/15 0001    Knee/Hip Exercises: Aerobic   Stationary Bike able  to make full revolutions today.    Knee/Hip Exercises: Standing   Lateral Step Up Right;2 sets;10 reps;Step Height: 6"   Forward Step Up Right;2 sets;10 reps;Step Height: 6"  no UE assist   Step Down Left;2 sets;10 reps;Step Height: 6"  with HHA and VC for form   Knee/Hip Exercises: Supine   Quad Sets Strengthening;Right;10 reps  10 sec   Bridges Limitations 2x10   Knee/Hip Exercises: Sidelying   Clams 30 reps Rt LE   Modalities   Modalities Cryotherapy;Electrical Stimulation;Moist Heat  Moist Heat Therapy   Number Minutes Moist Heat 15 Minutes   Moist Heat Location Knee  posterior   Cryotherapy   Number Minutes Cryotherapy 15 Minutes   Cryotherapy Location Knee   Type of Cryotherapy Ice pack   Electrical Stimulation   Electrical Stimulation Location Rt knee   Electrical Stimulation Action ion repelling   Electrical Stimulation Parameters to tolerance   Electrical Stimulation Goals Edema;Pain                  PT Short Term Goals - 09/05/15 1541    PT SHORT TERM GOAL #1   Title I with initial HEP ( 08/22/15)    Status Achieved   PT SHORT TERM GOAL #2   Title increase Rt knee extension =/< -2 ( 08/22/15)    Status Achieved   PT SHORT TERM GOAL #3   Title increase Rt knee flexion =/> 100 degrees ( 08/22/15)    Status Achieved   PT SHORT TERM GOAL #4   Title ambulate in the house without an assistive device ( 08/22/15)    Status Achieved           PT Long Term Goals - 09/05/15 1541    PT LONG TERM GOAL #1   Title I with advanced HEP ( 09/12/15)     Status On-going   PT LONG TERM GOAL #2   Title demo Rt knee flexion =/> 115 degrees ( 09/12/15)    Status On-going   PT LONG TERM GOAL #3   Title increase strength Rt LE =/> 5-/5 ( 09/12/15)    Status On-going   PT LONG TERM GOAL #4   Title ambulate on even and uneven surfaces without assistive device and no LOB ( 09/12/15)    Status Achieved   PT LONG TERM GOAL #5   Title improve FOTO =/< 50% limited, CK level ( 09/12/15)    Status On-going               Plan - 09/05/15 1546    Clinical Impression Statement Pt made good progress this past week, had increased ROM and strength in the Rt knee/hip.  She is able to perform higher level exercise and has met another goal. She isn't using her cane any more however does have some gait deviations.    Pt will benefit from skilled therapeutic intervention in order to improve on the following deficits Increased edema;Decreased strength;Decreased mobility;Decreased balance;Hypomobility;Pain;Difficulty walking;Decreased range of motion;Abnormal gait   Rehab Potential Excellent   PT Frequency 2x / week   PT Duration 6 weeks   PT Treatment/Interventions Ultrasound;Balance training;Neuromuscular re-education;Gait training;Patient/family education;Passive range of motion;Cryotherapy;Stair training;Dry needling;Electrical Stimulation;Moist Heat;Therapeutic exercise;Manual techniques;Vasopneumatic Device   PT Next Visit Plan gait training   Consulted and Agree with Plan of Care Patient        Problem List Patient Active Problem List   Diagnosis Date Noted  . Obese 07/11/2015  . S/P right TKA 07/10/2015  . SCABIES 04/19/2011  . ANXIETY 04/19/2011    Shelley Perez PT 09/05/2015, 3:58 PM  [facility] [place], [place], [address] Phone: [phone]   Fax:  [phone]  Name: Shelley Perez MRN: [id number] Date of Birth: [birth year]

## 2015-09-12 ENCOUNTER — Encounter: Payer: Medicare Other | Admitting: Rehabilitative and Restorative Service Providers"

## 2015-09-13 ENCOUNTER — Ambulatory Visit (INDEPENDENT_AMBULATORY_CARE_PROVIDER_SITE_OTHER): Payer: Medicare Other | Admitting: Physical Therapy

## 2015-09-13 DIAGNOSIS — R269 Unspecified abnormalities of gait and mobility: Secondary | ICD-10-CM

## 2015-09-13 DIAGNOSIS — M25661 Stiffness of right knee, not elsewhere classified: Secondary | ICD-10-CM | POA: Diagnosis not present

## 2015-09-13 DIAGNOSIS — R2240 Localized swelling, mass and lump, unspecified lower limb: Secondary | ICD-10-CM | POA: Diagnosis not present

## 2015-09-13 DIAGNOSIS — R29898 Other symptoms and signs involving the musculoskeletal system: Secondary | ICD-10-CM

## 2015-09-13 NOTE — Therapy (Signed)
[place], [place], [address] Phone: [phone]   Fax:  [phone]  Physical Therapy Treatment  Patient Details  Name: Shelley Perez MRN: [id number] Date of Birth: May 04, 1951 Referring Provider: Dr.Olin   Encounter Date: 09/13/2015      PT End of Session - 09/13/15 1103    Visit Number 9   Number of Visits 12   Date for PT Re-Evaluation 09/12/15   PT Start Time 1100   PT Stop Time 1158   PT Time Calculation (min) 58 min      Past Medical History  Diagnosis Date  . Depression   . Insomnia   . Fibromyalgia   . Hyperlipidemia   . Complication of anesthesia     "they gave me too much and I had to stay 3 days " 2005 after choley   . Asthma   . COPD (chronic obstructive pulmonary disease) ([place])   . Arthritis   . Osteoporosis   . History of kidney stones   . Hx of pancreatitis     with gallstones  . Anemia   . Anxiety     Past Surgical History  Procedure Laterality Date  . Knee surgery  1978 / 1971  . C sections    . Cholecystectomy    . Foot surgery    . Shoulder surgery    . Dental surgery    . Total knee arthroplasty Right 07/10/2015    Procedure: RIGHT TOTAL KNEE ARTHROPLASTY;  Surgeon: Paralee Cancel, MD;  Location: [facility];  Service: Orthopedics;  Laterality: Right;    There were no vitals filed for this visit.  Visit Diagnosis:  Stiffness of knee joint, right  Weakness of right leg  Localized swelling of lower leg  Abnormality of gait      Subjective Assessment - 09/13/15 1103    Subjective Pt reports no new changes since last visit. Pt reports the weather affected how her knee has felt.    Currently in Pain? No/denies            [facility] PT Assessment - 09/13/15 0001    Assessment   Medical Diagnosis Rt TKA   Referring Provider Dr.Olin    Onset Date/Surgical Date 07/10/15   AROM   Right/Left Knee Right   Right Knee Extension -2   Right Knee Flexion 110   AAROM with strap    Strength   Right/Left Hip Right   Right Hip Flexion 4+/5   Right Hip ABduction 3+/5   Right Knee Flexion --  5-/5   Right Knee Extension 5/5          [facility] Adult PT Treatment/Exercise - 09/13/15 0001    Knee/Hip Exercises: Stretches   Sports administrator 5 reps;30 seconds;Right   Knee/Hip Exercises: Aerobic   Stationary Bike full revolutions for 4 min; L 1 x 4 min    Knee/Hip Exercises: Standing   Heel Raises 1 set;Both;10 reps   Lateral Step Up Right;2 sets;10 reps;Step Height: 6"   Forward Step Up Right;2 sets;10 reps;Step Height: 6"  no UE assist   Knee/Hip Exercises: Seated   Other Seated Knee/Hip Exercises seated scoots for increased Rt knee ROM 5 sec hold x 15 reps    Knee/Hip Exercises: Supine   Heel Slides Right;1 set;10 reps  with strap to assist   Straight Leg Raises Strengthening;Right;1 set;15 reps  VC and demo for improved form.    Knee/Hip Exercises: Sidelying   Hip ABduction Strengthening;Right;1  set;10 reps   Clams 20 reps    Knee/Hip Exercises: Prone   Hip Extension Right;1 set;15 reps   Straight Leg Raises Limitations Prone TKE with toes tucked (RLE) x 5 sec hold x 12 reps    Modalities   Modalities Cryotherapy;Moist Heat;Electrical Stimulation   Moist Heat Therapy   Number Minutes Moist Heat 15 Minutes   Moist Heat Location Knee  posterior   Cryotherapy   Number Minutes Cryotherapy 15 Minutes   Cryotherapy Location Knee   Type of Cryotherapy Ice pack   Electrical Stimulation   Electrical Stimulation Location Rt knee    Electrical Stimulation Action ion repelling    Electrical Stimulation Parameters to tolerance    Electrical Stimulation Goals Edema;Pain           PT Short Term Goals - 09/05/15 1541    PT SHORT TERM GOAL #1   Title I with initial HEP ( 08/22/15)    Status Achieved   PT SHORT TERM GOAL #2   Title increase Rt knee extension =/< -2 ( 08/22/15)    Status Achieved   PT SHORT TERM GOAL #3   Title increase Rt knee  flexion =/> 100 degrees ( 08/22/15)    Status Achieved   PT SHORT TERM GOAL #4   Title ambulate in the house without an assistive device ( 08/22/15)    Status Achieved           PT Long Term Goals - 09/05/15 1541    PT LONG TERM GOAL #1   Title I with advanced HEP ( 09/12/15)    Status On-going   PT LONG TERM GOAL #2   Title demo Rt knee flexion =/> 115 degrees ( 09/12/15)    Status On-going   PT LONG TERM GOAL #3   Title increase strength Rt LE =/> 5-/5 ( 09/12/15)    Status On-going   PT LONG TERM GOAL #4   Title ambulate on even and uneven surfaces without assistive device and no LOB ( 09/12/15)    Status Achieved   PT LONG TERM GOAL #5   Title improve FOTO =/< 50% limited, CK level ( 09/12/15)    Status On-going               Plan - 09/13/15 1145    Clinical Impression Statement Pt had slight increased in Rt knee flexion ROM, continues with lacking 2 deg of extension. Pt required some VC and demonstration for improved form with exercise (to decrease compensatory strategies with back).  Pt continues with decreased RLE strength; will benefit from continued PT intervention to maximize functional mobility.   Gradually progressing towards goals.    Pt will benefit from skilled therapeutic intervention in order to improve on the following deficits Increased edema;Decreased strength;Decreased mobility;Decreased balance;Hypomobility;Pain;Difficulty walking;Decreased range of motion;Abnormal gait   Rehab Potential Excellent   PT Frequency 2x / week   PT Duration 6 weeks   PT Treatment/Interventions Ultrasound;Balance training;Neuromuscular re-education;Gait training;Patient/family education;Passive range of motion;Cryotherapy;Stair training;Dry needling;Electrical Stimulation;Moist Heat;Therapeutic exercise;Manual techniques;Vasopneumatic Device   PT Next Visit Plan Continue progressive ROM and strengthening for Rt knee.    Consulted and Agree with Plan of Care Patient         Problem List Patient Active Problem List   Diagnosis Date Noted  . Obese 07/11/2015  . S/P right TKA 07/10/2015  . SCABIES 04/19/2011  . ANXIETY 04/19/2011    Kerin Perna, PTA 09/13/2015 11:52 AM  [place] [phone]  [place], [place], [address] Phone: [phone]   Fax:  [phone]  Name: Shelley Perez MRN: [id number] Date of Birth: 10-Feb-1951

## 2015-09-19 ENCOUNTER — Ambulatory Visit (INDEPENDENT_AMBULATORY_CARE_PROVIDER_SITE_OTHER): Payer: Medicare Other | Admitting: Physical Therapy

## 2015-09-19 DIAGNOSIS — R269 Unspecified abnormalities of gait and mobility: Secondary | ICD-10-CM | POA: Diagnosis not present

## 2015-09-19 DIAGNOSIS — M25661 Stiffness of right knee, not elsewhere classified: Secondary | ICD-10-CM | POA: Diagnosis not present

## 2015-09-19 DIAGNOSIS — R2240 Localized swelling, mass and lump, unspecified lower limb: Secondary | ICD-10-CM

## 2015-09-19 DIAGNOSIS — R29898 Other symptoms and signs involving the musculoskeletal system: Secondary | ICD-10-CM | POA: Diagnosis not present

## 2015-09-19 NOTE — Therapy (Addendum)
[place], [place], [address] Phone: [phone]   Fax:  [phone]  Physical Therapy Treatment  Patient Details  Name: Shelley Perez MRN: [id number] Date of Birth: 27-Feb-1951 Referring Provider: Dr. Alvan Dame  Encounter Date: 09/19/2015      PT End of Session - 09/19/15 1057    Visit Number 10   Number of Visits 18   Date for PT Re-Evaluation 10/11/15   PT Start Time 4818   PT Stop Time 1116   PT Time Calculation (min) 61 min      Past Medical History  Diagnosis Date  . Depression   . Insomnia   . Fibromyalgia   . Hyperlipidemia   . Complication of anesthesia     "they gave me too much and I had to stay 3 days " 2005 after choley   . Asthma   . COPD (chronic obstructive pulmonary disease) ([place])   . Arthritis   . Osteoporosis   . History of kidney stones   . Hx of pancreatitis     with gallstones  . Anemia   . Anxiety     Past Surgical History  Procedure Laterality Date  . Knee surgery  1978 / 1971  . C sections    . Cholecystectomy    . Foot surgery    . Shoulder surgery    . Dental surgery    . Total knee arthroplasty Right 07/10/2015    Procedure: RIGHT TOTAL KNEE ARTHROPLASTY;  Surgeon: Paralee Cancel, MD;  Location: [facility];  Service: Orthopedics;  Laterality: Right;    There were no vitals filed for this visit.  Visit Diagnosis:  Stiffness of knee joint, right - Plan: PT plan of care cert/re-cert  Weakness of right leg - Plan: PT plan of care cert/re-cert  Localized swelling of lower leg - Plan: PT plan of care cert/re-cert  Abnormality of gait - Plan: PT plan of care cert/re-cert      Subjective Assessment - 09/19/15 1016    Subjective Pt reports she has been rocking (in chair ) a lot and that has helped.  she reports that SLR continue to be challenging.     Currently in Pain? Yes   Pain Score 1    Pain Location Knee   Pain Orientation Right   Pain Descriptors /  Indicators Dull   Aggravating Factors  twisting wrong with sleeping    Pain Relieving Factors ice, elevation, medication             [facility] PT Assessment - 09/19/15 0001    Assessment   Medical Diagnosis Rt TKA   Referring Provider Dr. Alvan Dame   Onset Date/Surgical Date 07/10/15   Observation/Other Assessments   Focus on Therapeutic Outcomes (FOTO)  50% limited    AROM   Right/Left Knee Right   Right Knee Extension -2   Right Knee Flexion 111  112 deg with Cherlynn June Adult PT Treatment/Exercise - 09/19/15 0001    Knee/Hip Exercises: Stretches   Passive Hamstring Stretch Right;30 seconds;3 reps   Gastroc Stretch Right;Left;3 reps;30 seconds   Other Knee/Hip Stretches Rt ITB stretch x 30 sec x 3 reps    Knee/Hip Exercises: Aerobic   Stationary Bike L2: 6 min    Knee/Hip Exercises: Machines for Strengthening   Cybex Knee Extension 1 plate- 2 reps both legs . unable to tolerate.    Total Gym Leg Press 3  plates x 10 RLE, 4 plates x 10 RLE   Knee/Hip Exercises: Standing   Heel Raises 2 sets;Both;10 reps  heels off step   Lateral Step Up Right;2 sets;10 reps;Step Height: 6"   SLS On blue pad:  5 trials of 15-25 sec with occasional UE support to steady (challenging)    Knee/Hip Exercises: Seated   Long Arc Quad Strengthening;Right;10 reps;2 sets   Long Arc Quad Weight --  4# x 10, 5# x 10   Other Seated Knee/Hip Exercises seated scoots for increased Rt knee ROM 10 sec hold x 12 reps    Knee/Hip Exercises: Supine   Straight Leg Raises Right;1 set;10 reps   Straight Leg Raise with External Rotation Strengthening;Right;5 reps;2 sets   Knee/Hip Exercises: Sidelying   Hip ABduction Strengthening;Right;1 set;10 reps   Clams 20 reps    Modalities   Modalities Cryotherapy;Electrical Stimulation   Moist Heat Therapy   Number Minutes Moist Heat 15 Minutes   Moist Heat Location Knee  posterior   Cryotherapy   Number Minutes Cryotherapy 15 Minutes   Cryotherapy Location  Knee  anterior Rt   Type of Cryotherapy Ice pack   Electrical Stimulation   Electrical Stimulation Location Rt knee    Electrical Stimulation Action ion repelling    Electrical Stimulation Parameters to tolerance    Electrical Stimulation Goals Edema;Pain              PT Short Term Goals - 09/05/15 1541    PT SHORT TERM GOAL #1   Title I with initial HEP ( 08/22/15)    Status Achieved   PT SHORT TERM GOAL #2   Title increase Rt knee extension =/< -2 ( 08/22/15)    Status Achieved   PT SHORT TERM GOAL #3   Title increase Rt knee flexion =/> 100 degrees ( 08/22/15)    Status Achieved   PT SHORT TERM GOAL #4   Title ambulate in the house without an assistive device ( 08/22/15)    Status Achieved           PT Long Term Goals - 09/19/15 1246    PT LONG TERM GOAL #1   Title I with advanced HEP ( 10/11/15)    Time 4   Period Weeks   Status On-going   PT LONG TERM GOAL #2   Title demo Rt knee flexion =/> 115 degrees ( 10/11/15)    Time 4   Period Weeks   Status On-going   PT LONG TERM GOAL #3   Title increase strength Rt LE =/> 5-/5 ( 10/11/15)    Time 4   Period Weeks   Status On-going   PT LONG TERM GOAL #4   Title ambulate on even and uneven surfaces without assistive device and no LOB ( 09/12/15)    Time 4   Period Weeks   Status Achieved   PT LONG TERM GOAL #5   Title improve FOTO =/< 40% limited, CK level ( 10/11/15)    Time 6   Period Weeks   Status Revised               Plan - 09/19/15 1056    Clinical Impression Statement Pt demonstrated slight increase in Rt knee flexion ROM, continues to lack 2 deg of extension.  Pt able to tolerate increased resistance with LE exercise this visit.  Pt has met LTG #5, scoring 50% on FOTO.  She is making gradual progress towards established goals. Pt interested in  and will benefit from continuation of therapy to increase functional mobility independence.    Pt will benefit from skilled therapeutic intervention in order  to improve on the following deficits Increased edema;Decreased strength;Decreased mobility;Decreased balance;Hypomobility;Pain;Difficulty walking;Decreased range of motion;Abnormal gait   Rehab Potential Excellent   PT Frequency 2x / week   PT Duration 6 weeks   PT Treatment/Interventions Ultrasound;Balance training;Neuromuscular re-education;Gait training;Patient/family education;Passive range of motion;Cryotherapy;Stair training;Dry needling;Electrical Stimulation;Moist Heat;Therapeutic exercise;Manual techniques;Vasopneumatic Device   PT Next Visit Plan Spoke to supervising PT regarding pt's progress and her desire to continue therapy. Continue progressive ROM and strengthening for Rt knee.    Consulted and Agree with Plan of Care Patient        Problem List Patient Active Problem List   Diagnosis Date Noted  . Obese 07/11/2015  . S/P right TKA 07/10/2015  . SCABIES 04/19/2011  . ANXIETY 04/19/2011    Kerin Perna, PTA 09/19/2015 5:20 PM  [facility] [place], [place], [address] Phone: [phone]   Fax:  [phone]  Name: Shelley Perez MRN: [id number] Date of Birth: 1951-06-02    PHYSICAL THERAPY DISCHARGE SUMMARY  Visits from Start of Care: 10  Current functional level related to goals / functional outcomes: See above for function at last visit   Remaining deficits: unknown   Education / Equipment: HEP  Plan: Patient agrees to discharge.  Patient goals were partially met. Patient is being discharged due to the patient's request.  ?????   Jeral Pinch, PT 10/18/2015 8:21 AM

## 2015-09-26 ENCOUNTER — Encounter: Payer: Medicare Other | Admitting: Physical Therapy

## 2015-10-30 ENCOUNTER — Emergency Department
Admission: EM | Admit: 2015-10-30 | Discharge: 2015-10-30 | Disposition: A | Discharge status: Home / Self Care | Payer: Medicare Other | Source: Home / Self Care | Attending: Family Medicine | Admitting: Family Medicine

## 2015-10-30 DIAGNOSIS — R69 Illness, unspecified: Principal | ICD-10-CM

## 2015-10-30 DIAGNOSIS — J111 Influenza due to unidentified influenza virus with other respiratory manifestations: Secondary | ICD-10-CM | POA: Diagnosis not present

## 2015-10-30 DIAGNOSIS — J9801 Acute bronchospasm: Secondary | ICD-10-CM | POA: Diagnosis not present

## 2015-10-30 LAB — POCT RAPID STREP A (OFFICE): Rapid Strep A Screen: NEGATIVE

## 2015-10-30 MED ORDER — METHYLPREDNISOLONE SODIUM SUCC 125 MG IJ SOLR
80.0000 mg | Freq: Once | INTRAMUSCULAR | Status: AC
  Administered 2015-10-30: 80 mg via INTRAMUSCULAR

## 2015-10-30 MED ORDER — BENZONATATE 200 MG PO CAPS: 200.0000 mg | ORAL_CAPSULE | Freq: Every day | ORAL | Status: DC

## 2015-10-30 MED ORDER — PREDNISONE 20 MG PO TABS: 20.0000 mg | ORAL_TABLET | Freq: Two times a day (BID) | ORAL | Status: DC

## 2015-10-30 MED ORDER — OSELTAMIVIR PHOSPHATE 75 MG PO CAPS: 75.0000 mg | ORAL_CAPSULE | Freq: Two times a day (BID) | ORAL | Status: DC

## 2015-10-30 NOTE — ED Notes (Signed)
Started last night with headache, coughing, congestion, runny nose, sore throat, and has progressively felt worse as the day has progressed.

## 2015-10-30 NOTE — Discharge Instructions (Signed)
Start prednisone Wednesday 10/31/15. Take plain guaifenesin (1200mg  extended release tabs such as Mucinex) twice daily, with plenty of water, for cough and congestion.  Get adequate rest.   May use Afrin nasal spray (or generic oxymetazoline) twice daily for about 5 days and then discontinue.  Also recommend using saline nasal spray several times daily and saline nasal irrigation (AYR is a common brand).  Use Flonase nasal spray each morning after using Afrin nasal spray and saline nasal irrigation. Try warm salt water gargles for sore throat.  Stop all antihistamines for now, and other non-prescription cough/cold preparations. Continue albuterol by nebulizer as needed. Follow-up with family doctor if not improving about one week.

## 2015-10-30 NOTE — ED Provider Notes (Signed)
CSN: [id number]     Arrival date & time 10/30/15  1738 History   First MD Initiated Contact with Patient 10/30/15 [place]     Chief Complaint  Patient presents with  . Sore Throat      HPI Comments: Patient suddenly developed cold-like symptoms last night with headache, cough, sinus and chest congestion, sore throat, and fatigue.  She feels worse today, but no fevers, chills, and sweats.  The history is provided by the patient.    Past Medical History  Diagnosis Date  . Depression   . Insomnia   . Fibromyalgia   . Hyperlipidemia   . Complication of anesthesia     "they gave me too much and I had to stay 3 days " 2005 after choley   . Asthma   . COPD (chronic obstructive pulmonary disease) ([place])   . Arthritis   . Osteoporosis   . History of kidney stones   . Hx of pancreatitis     with gallstones  . Anemia   . Anxiety    Past Surgical History  Procedure Laterality Date  . Knee surgery  1978 / 1971  . C sections    . Cholecystectomy    . Foot surgery    . Shoulder surgery    . Dental surgery    . Total knee arthroplasty Right 07/10/2015    Procedure: RIGHT TOTAL KNEE ARTHROPLASTY;  Surgeon: Paralee Cancel, MD;  Location: [facility];  Service: Orthopedics;  Laterality: Right;   Family History  Problem Relation Age of Onset  . Hyperlipidemia Mother   . Hypertension Mother   . Heart attack Mother   . Cancer Father    Social History  Substance Use Topics  . Smoking status: Former Smoker -- 40 years    Types: Cigarettes    Quit date: 06/28/2013  . Smokeless tobacco: Never Used  . Alcohol Use: No   OB History    No data available     Review of Systems + sore throat + hoarse + cough No pleuritic pain + wheezing + nasal congestion + post-nasal drainage No sinus pain/pressure No itchy/red eyes ? earache No hemoptysis No SOB No fever/chills but has felt cold No nausea No vomiting No abdominal pain No diarrhea No urinary symptoms No skin rash + fatigue ?  myalgias + headache Used OTC meds without relief  Allergies  Atorvastatin; Codeine; Crestor; Prozac; Latex; and Sulfa antibiotics  Home Medications   Prior to Admission medications   Medication Sig Start Date End Date Taking? Authorizing Provider  albuterol (ACCUNEB) 1.25 MG/3ML nebulizer solution 1.25 mg every 6 (six) hours as needed for wheezing or shortness of breath.  10/26/14 10/26/15  Historical Provider, MD  albuterol (PROVENTIL HFA;VENTOLIN HFA) 108 (90 BASE) MCG/ACT inhaler Inhale 2 puffs into the lungs every 6 (six) hours as needed for wheezing or shortness of breath.  09/20/14   Historical Provider, MD  benzonatate (TESSALON) 200 MG capsule Take 1 capsule (200 mg total) by mouth at bedtime. Take as needed for cough 10/30/15   Kandra Nicolas, MD  Cholecalciferol (VITAMIN D3) 10000 UNITS TABS Take 10,000 Units by mouth daily.    Historical Provider, MD  COENZYME Q-10 PO Take 100 mg by mouth daily.    Historical Provider, MD  docusate sodium (COLACE) 100 MG capsule Take 1 capsule (100 mg total) by mouth 2 (two) times daily. 07/11/15   Danae Orleans, PA-C  ferrous sulfate 325 (65 FE) MG tablet Take 1 tablet (325  mg total) by mouth 3 (three) times daily after meals. 07/11/15   Danae Orleans, PA-C  HYDROcodone-acetaminophen (NORCO) 7.5-325 MG tablet Take 1-2 tablets by mouth every 4 (four) hours as needed for moderate pain. 07/11/15   Danae Orleans, PA-C  methocarbamol (ROBAXIN) 500 MG tablet Take 1 tablet (500 mg total) by mouth every 6 (six) hours as needed for muscle spasms. 07/11/15   Danae Orleans, PA-C  Misc Natural Products (OSTEO BI-FLEX JOINT SHIELD) TABS Take 1 tablet by mouth daily.    Historical Provider, MD  omega-3 acid ethyl esters (LOVAZA) 1 G capsule Take 1 g by mouth 2 (two) times daily.    Historical Provider, MD  oseltamivir (TAMIFLU) 75 MG capsule Take 1 capsule (75 mg total) by mouth every 12 (twelve) hours. 10/30/15   Kandra Nicolas, MD  oxyCODONE (OXY IR/ROXICODONE)  5 MG immediate release tablet Take 1-3 tablets (5-15 mg total) by mouth every 4 (four) hours as needed for moderate pain or severe pain. Patient not taking: Reported on 08/01/2015 07/12/15   Paralee Cancel, MD  polyethylene glycol [facility] Floria Raveling) packet Take 17 g by mouth 2 (two) times daily. 07/11/15   Danae Orleans, PA-C  predniSONE (DELTASONE) 20 MG tablet Take 1 tablet (20 mg total) by mouth 2 (two) times daily. Take with food. 10/30/15   Kandra Nicolas, MD  sertraline (ZOLOFT) 100 MG tablet Take 100 mg by mouth daily.  06/15/14   Historical Provider, MD   Meds Ordered and Administered this Visit   Medications  methylPREDNISolone sodium succinate (SOLU-MEDROL) 125 mg/2 mL injection 80 mg (80 mg Intramuscular Given 10/30/15 1858)    BP 151/85 mmHg  Pulse 78  Temp(Src) 97.3 F (36.3 C) (Oral)  Ht 5\' 1"  (1.549 m)  Wt 187 lb 4 oz (84.936 kg)  BMI 35.40 kg/m2  SpO2 92% No data found.   Physical Exam Nursing notes and Vital Signs reviewed. Appearance:  Patient appears stated age, and in no acute distress.  Patient is obese (BMI 35.4) Eyes:  Pupils are equal, round, and reactive to light and accomodation.  Extraocular movement is intact.  Conjunctivae are not inflamed  Ears:  Canals normal.  Tympanic membranes normal.  Nose:  Mildly congested turbinates.  No sinus tenderness.  Pharynx:  Normal Neck:  Supple.  Tender enlarged posterior nodes are palpated bilaterally  Lungs:   Scattered faint posterior expiratory wheezes heard.  Breath sounds are equal.  Moving air well. Heart:  Regular rate and rhythm without murmurs, rubs, or gallops.  Abdomen:  Nontender without masses or hepatosplenomegaly.  Bowel sounds are present.  No CVA or flank tenderness.  Extremities:  No edema.  Skin:  No rash present.   ED Course  Procedures negative    Labs Reviewed  POCT RAPID STREP A (OFFICE) negative     MDM   1. Influenza-like illness   2. Bronchospasm    Administered Solumedrol 80mg   IM.  Begin prednisone burst tomorrow. Begin Tamiflu.  Prescription written for Benzonatate [facility]) to take at bedtime for night-time cough.  Start prednisone Wednesday 10/31/15. Take plain guaifenesin (1200mg  extended release tabs such as Mucinex) twice daily, with plenty of water, for cough and congestion.  Get adequate rest.   May use Afrin nasal spray (or generic oxymetazoline) twice daily for about 5 days and then discontinue.  Also recommend using saline nasal spray several times daily and saline nasal irrigation (AYR is a common brand).  Use Flonase nasal spray each morning after using  Afrin nasal spray and saline nasal irrigation. Try warm salt water gargles for sore throat.  Stop all antihistamines for now, and other non-prescription cough/cold preparations. Continue albuterol by nebulizer as needed. Follow-up with family doctor if not improving about one week.     Kandra Nicolas, MD 11/06/15 [phone]

## 2015-11-01 ENCOUNTER — Telehealth: Payer: Self-pay | Admitting: Emergency Medicine

## 2015-11-01 NOTE — ED Notes (Deleted)
Inquired about patient's status; encourage them to call with questions/concerns.  

## 2015-11-10 ENCOUNTER — Ambulatory Visit (INDEPENDENT_AMBULATORY_CARE_PROVIDER_SITE_OTHER): Payer: Medicare Other | Admitting: Physician Assistant

## 2015-11-10 ENCOUNTER — Ambulatory Visit (INDEPENDENT_AMBULATORY_CARE_PROVIDER_SITE_OTHER): Payer: Medicare Other

## 2015-11-10 VITALS — BP 122/64 | HR 100 | Temp 99.1°F | Resp 34 | Ht 61.0 in | Wt 187.0 lb

## 2015-11-10 DIAGNOSIS — J441 Chronic obstructive pulmonary disease with (acute) exacerbation: Secondary | ICD-10-CM

## 2015-11-10 DIAGNOSIS — Z87891 Personal history of nicotine dependence: Secondary | ICD-10-CM

## 2015-11-10 DIAGNOSIS — J984 Other disorders of lung: Secondary | ICD-10-CM | POA: Diagnosis not present

## 2015-11-10 DIAGNOSIS — R062 Wheezing: Secondary | ICD-10-CM | POA: Diagnosis not present

## 2015-11-10 LAB — POCT CBC
GRANULOCYTE PERCENT: 67 % (ref 37–80)
HCT, POC: 36.3 % — AB (ref 37.7–47.9)
Hemoglobin: 12.6 g/dL (ref 12.2–16.2)
Lymph, poc: 2 (ref 0.6–3.4)
MCH, POC: 29.2 pg (ref 27–31.2)
MCHC: 34.6 g/dL (ref 31.8–35.4)
MCV: 84.3 fL (ref 80–97)
MID (CBC): 1.1 — AB (ref 0–0.9)
MPV: 7.4 fL (ref 0–99.8)
PLATELET COUNT, POC: 379 10*3/uL (ref 142–424)
POC Granulocyte: 6.2 (ref 2–6.9)
POC LYMPH %: 21 % (ref 10–50)
POC MID %: 12 %M (ref 0–12)
RBC: 4.3 M/uL (ref 4.04–5.48)
RDW, POC: 14.4 %
WBC: 9.3 10*3/uL (ref 4.6–10.2)

## 2015-11-10 MED ORDER — PREDNISONE 20 MG PO TABS: ORAL_TABLET | ORAL | Status: AC

## 2015-11-10 MED ORDER — DOXYCYCLINE HYCLATE 100 MG PO CAPS: 100.0000 mg | ORAL_CAPSULE | Freq: Two times a day (BID) | ORAL | Status: AC

## 2015-11-10 MED ORDER — IPRATROPIUM BROMIDE 0.02 % IN SOLN
0.5000 mg | Freq: Once | RESPIRATORY_TRACT | Status: AC
  Administered 2015-11-10: 0.5 mg via RESPIRATORY_TRACT

## 2015-11-10 MED ORDER — ALBUTEROL SULFATE (2.5 MG/3ML) 0.083% IN NEBU
2.5000 mg | INHALATION_SOLUTION | Freq: Once | RESPIRATORY_TRACT | Status: AC
  Administered 2015-11-10: 2.5 mg via RESPIRATORY_TRACT

## 2015-11-10 MED ORDER — METHYLPREDNISOLONE SODIUM SUCC 125 MG IJ SOLR
125.0000 mg | Freq: Once | INTRAMUSCULAR | Status: AC
Start: 2015-11-10 — End: 2015-11-10
  Administered 2015-11-10: 125 mg via INTRAMUSCULAR

## 2015-11-10 NOTE — Progress Notes (Signed)
11/10/2015 1:37 PM   DOB: 09-13-1950 / MRN: [id number]  SUBJECTIVE:  Shelley Perez is a 65 y.o. female presenting for SOB.  She has a history of COPD and reports this problem started roughly 2 weeks ago.  She was seen at an [facility] and was advised she had the flu and was placed on Tamiflu. She was given solumedrol 80 and was started on prednisone the next day.  She was given benzonatate for cough.  She reports she has completed this regimen however still feels SOB and is coughing.    She is allergic to atorvastatin; codeine; crestor; prozac; latex; and sulfa antibiotics.   She  has a past medical history of Depression; Insomnia; Fibromyalgia; Hyperlipidemia; Complication of anesthesia; Asthma; COPD (chronic obstructive pulmonary disease) ([place]); Arthritis; Osteoporosis; History of kidney stones; pancreatitis; Anemia; and Anxiety.    She  reports that she quit smoking about 2 years ago. Her smoking use included Cigarettes. She quit after 40 years of use. She has never used smokeless tobacco. She reports that she does not drink alcohol or use illicit drugs. She  has no sexual activity history on file. The patient  has past surgical history that includes Knee surgery (1978 / 1971); c sections; Cholecystectomy; Foot surgery; Shoulder surgery; Dental surgery; and Total knee arthroplasty (Right, 07/10/2015).  Her family history includes Cancer in her father; Heart attack in her mother; Hyperlipidemia in her mother; Hypertension in her mother.  Review of Systems  Constitutional: Negative for fever.  Respiratory: Positive for cough, sputum production, shortness of breath and wheezing. Negative for hemoptysis.   Cardiovascular: Negative for chest pain.  Gastrointestinal: Negative for nausea.  Skin: Negative for rash.  Neurological: Negative for dizziness and headaches.    Problem list and medications reviewed and updated by myself where necessary, and exist elsewhere in the encounter.    OBJECTIVE:  BP 122/64 mmHg  Pulse 100  Temp(Src) 99.1 F (37.3 C) (Oral)  Resp 34  Ht 5\' 1"  (1.549 m)  Wt 187 lb (84.823 kg)  BMI 35.35 kg/m2  SpO2 94%  Physical Exam  Constitutional: She is oriented to person, place, and time. She appears well-developed and well-nourished. No distress.  Cardiovascular: Normal rate, regular rhythm and normal heart sounds.   No murmur heard. Pulmonary/Chest: She is in respiratory distress. She has wheezes. She has no rales. She exhibits no tenderness.  Musculoskeletal: Normal range of motion.  Neurological: She is alert and oriented to person, place, and time. No cranial nerve deficit.  Skin: She is not diaphoretic.  Vitals reviewed.   Results for orders placed or performed in visit on 11/10/15 (from the past 72 hour(s))  POCT CBC     Status: Abnormal   Collection Time: 11/10/15 12:16 PM  Result Value Ref Range   WBC 9.3 4.6 - 10.2 K/uL   Lymph, poc 2.0 0.6 - 3.4   POC LYMPH PERCENT 21.0 10 - 50 %L   MID (cbc) 1.1 (A) 0 - 0.9   POC MID % 12.0 0 - 12 %M   POC Granulocyte 6.2 2 - 6.9   Granulocyte percent 67.0 37 - 80 %G   RBC 4.30 4.04 - 5.48 M/uL   Hemoglobin 12.6 12.2 - 16.2 g/dL   HCT, POC 36.3 (A) 37.7 - 47.9 %   MCV 84.3 80 - 97 fL   MCH, POC 29.2 27 - 31.2 pg   MCHC 34.6 31.8 - 35.4 g/dL   RDW, POC 14.4 %   Platelet Count, POC  379 142 - 424 K/uL   MPV 7.4 0 - 99.8 fL   Dg Chest 2 View  11/10/2015  CLINICAL DATA:  Short of breath, difficulty breathing EXAM: CHEST  2 VIEW COMPARISON:  05/22/2010 FINDINGS: Normal mediastinum and cardiac silhouette. Normal pulmonary vasculature. No evidence of effusion, infiltrate, or pneumothorax. There is a new rounded density projecting over the LEFT hemidiaphragm and heart measuring 4 cm. This is not present on comparison exams. No acute bony abnormality. IMPRESSION: 1. No acute cardiopulmonary process. 2. A 4 cm rounded density projecting over the LEFT hemidiaphragm is suggestive of a lipoma or  herniated fat. As lesion not present on comparison exams, recommend CT thorax for further evaluation. Electronically Signed   By: Suzy Bouchard M.D.   On: 11/10/2015 13:06    ASSESSMENT AND PLAN  Quinnie was seen today for shortness of breath and cough.  Diagnoses and all orders for this visit:  Wheezing: 65 year old female here today with SOB, productive cough and mild respiratory distress that has been slowly worsening over the last few weeks since being diagnosed with influenza like illness.  She was treated appropriately at that time given her symptoms. Pulse oximetry up to 99% with 2 liters of O2 and duo nebs.  Will repeat albuterol here.  Will administer 125 solumedrol and will start dose pack tomorrow.  Her white count is negative here however given her history will start doxycycline po to cover for atypical pneumonia.  Advise that she return tomorrow for recheck.  She was at 97% on room air upon leaving the clinic.   -     POCT CBC -     albuterol (PROVENTIL) (2.5 MG/3ML) 0.083% nebulizer solution 2.5 mg; Take 3 mLs (2.5 mg total) by nebulization once. -     ipratropium (ATROVENT) nebulizer solution 0.5 mg; Take 2.5 mLs (0.5 mg total) by nebulization once.  Former smoker: See problem 1.    COPD exacerbation ([place]): See problem 1.  -     methylPREDNISolone sodium succinate (SOLU-MEDROL) 125 mg/2 mL injection 125 mg; Inject 2 mLs (125 mg total) into the muscle once.  Lung Density of chest x-ray: See rads.  Ordering non stat CT with contrast.      The patient was advised to call or return to clinic if she does not see an improvement in symptoms or to seek the care of the closest emergency department if she worsens with the above plan.   Philis Fendt, MHS, PA-C [facility] 11/10/2015 1:37 PM

## 2015-11-11 ENCOUNTER — Ambulatory Visit (INDEPENDENT_AMBULATORY_CARE_PROVIDER_SITE_OTHER): Payer: Medicare Other | Admitting: Physician Assistant

## 2015-11-11 VITALS — BP 120/72 | HR 105 | Temp 98.2°F | Resp 20 | Ht 61.0 in | Wt 184.8 lb

## 2015-11-11 DIAGNOSIS — J441 Chronic obstructive pulmonary disease with (acute) exacerbation: Secondary | ICD-10-CM

## 2015-11-11 DIAGNOSIS — J449 Chronic obstructive pulmonary disease, unspecified: Secondary | ICD-10-CM | POA: Diagnosis not present

## 2015-11-11 DIAGNOSIS — Z8709 Personal history of other diseases of the respiratory system: Secondary | ICD-10-CM | POA: Diagnosis not present

## 2015-11-11 MED ORDER — BECLOMETHASONE DIPROPIONATE 80 MCG/ACT IN AERS: 1.0000 | INHALATION_SPRAY | Freq: Two times a day (BID) | RESPIRATORY_TRACT | Status: DC

## 2015-11-11 MED ORDER — ALBUTEROL SULFATE (2.5 MG/3ML) 0.083% IN NEBU
2.5000 mg | INHALATION_SOLUTION | Freq: Once | RESPIRATORY_TRACT | Status: AC
  Administered 2015-11-11: 2.5 mg via RESPIRATORY_TRACT

## 2015-11-11 MED ORDER — IPRATROPIUM BROMIDE 0.02 % IN SOLN
0.5000 mg | Freq: Four times a day (QID) | RESPIRATORY_TRACT | Status: DC | PRN
Start: 2015-11-11 — End: 2016-12-19

## 2015-11-11 MED ORDER — IPRATROPIUM BROMIDE 0.02 % IN SOLN
0.5000 mg | Freq: Once | RESPIRATORY_TRACT | Status: AC
  Administered 2015-11-11: 0.5 mg via RESPIRATORY_TRACT

## 2015-11-11 NOTE — Patient Instructions (Signed)
     IF you received an x-ray today, you will receive an invoice from [facility]. Please contact [facility] at [phone] with questions or concerns regarding your invoice.   IF you received labwork today, you will receive an invoice from [facility]. Please contact [facility] at [phone] with questions or concerns regarding your invoice.   Our billing staff will not be able to assist you with questions regarding bills from these companies.  You will be contacted with the lab results as soon as they are available. The fastest way to get your results is to activate your My Chart account. Instructions are located on the last page of this paperwork. If you have not heard from us regarding the results in 2 weeks, please contact this office.      

## 2015-11-11 NOTE — Progress Notes (Signed)
11/21/2015 8:25 AM   DOB: January 17, 1951 / MRN: [id number]  SUBJECTIVE:  Shelley Perez is a 65 y.o. female presenting for a recheck of cough.  She reports feeling much better overall and states that she has been able to do ADLs without SOB.  She is taking doxy and pred without complaint.  Reports she has a long standing history of COPD and has not been referred to pulm and does not have a have an inhaled corticosteroid prescribed.  She would like to try this.     She is allergic to atorvastatin; codeine; crestor; prozac; latex; and sulfa antibiotics.   She  has a past medical history of Depression; Insomnia; Fibromyalgia; Hyperlipidemia; Complication of anesthesia; Asthma; COPD (chronic obstructive pulmonary disease) ([place]); Arthritis; Osteoporosis; History of kidney stones; pancreatitis; Anemia; and Anxiety.    She  reports that she quit smoking about 2 years ago. Her smoking use included Cigarettes. She quit after 40 years of use. She has never used smokeless tobacco. She reports that she does not drink alcohol or use illicit drugs. She  has no sexual activity history on file. The patient  has past surgical history that includes Knee surgery (1978 / 1971); c sections; Cholecystectomy; Foot surgery; Shoulder surgery; Dental surgery; and Total knee arthroplasty (Right, 07/10/2015).  Her family history includes Cancer in her father; Heart attack in her mother; Hyperlipidemia in her mother; Hypertension in her mother.  Review of Systems  Constitutional: Negative for fever and chills.  Respiratory: Positive for cough. Negative for shortness of breath and wheezing.   Cardiovascular: Negative for chest pain.  Gastrointestinal: Negative for nausea.  Genitourinary: Negative for dysuria.  Musculoskeletal: Negative for myalgias.  Skin: Negative for rash.  Neurological: Negative for headaches.  Psychiatric/Behavioral: Negative for depression.    Problem list and medications reviewed and updated by  myself where necessary, and exist elsewhere in the encounter.   OBJECTIVE:  BP 120/72 mmHg  Pulse 105  Temp(Src) 98.2 F (36.8 C) (Oral)  Resp 20  Ht 5\' 1"  ([id number] m)  Wt 184 lb 12.8 oz (83.825 kg)  BMI 34.94 kg/m2  SpO2 94%  Physical Exam  Constitutional: She is oriented to person, place, and time. She appears well-nourished. No distress.  Eyes: EOM are normal. Pupils are equal, round, and reactive to light.  Cardiovascular: Normal rate.   Pulmonary/Chest: Effort normal. No respiratory distress. She has no wheezes. She has no rales. She exhibits no tenderness.  Abdominal: She exhibits no distension.  Neurological: She is alert and oriented to person, place, and time. No cranial nerve deficit. Gait normal.  Skin: Skin is dry. She is not diaphoretic.  Psychiatric: She has a normal mood and affect.  Vitals reviewed.   No results found for this or any previous visit (from the past 72 hour(s)).  No results found.  ASSESSMENT AND PLAN  Hasley was seen today for follow-up.  Diagnoses and all orders for this visit:  History of COPD: She is improving.  Will get her in with pulm and start an inhaled corticosteroid.   -     beclomethasone (QVAR) 80 MCG/ACT inhaler; Inhale 1 puff into the lungs 2 (two) times daily. -     Ambulatory referral to Pulmonology  COPD exacerbation ([place]) -     ipratropium (ATROVENT) 0.02 % nebulizer solution; Take 2.5 mLs (0.5 mg total) by nebulization every 6 (six) hours as needed for wheezing or shortness of breath. -     albuterol (PROVENTIL) (2.5 MG/3ML) 0.083% nebulizer  solution 2.5 mg; Take 3 mLs (2.5 mg total) by nebulization once. -     ipratropium (ATROVENT) nebulizer solution 0.5 mg; Take 2.5 mLs (0.5 mg total) by nebulization once.    The patient was advised to call or return to clinic if she does not see an improvement in symptoms or to seek the care of the closest emergency department if she worsens with the above plan.   Philis Fendt,  MHS, PA-C [facility] 11/21/2015 8:25 AM

## 2015-11-12 ENCOUNTER — Other Ambulatory Visit: Payer: Self-pay | Admitting: *Deleted

## 2015-11-12 ENCOUNTER — Telehealth: Payer: Self-pay

## 2015-11-12 DIAGNOSIS — J984 Other disorders of lung: Secondary | ICD-10-CM

## 2015-11-12 NOTE — Telephone Encounter (Signed)
LMOV for patient to return call.  She is scheduled for an appointment at [facility] on Friday 11/16/15 with a 10:40am arrival time for an 11:00am appointment.  The patient will have labwork for BUN and Creatinine drawn prior to the imaging scan.  She should not eat solid foods 4 hours prior to the appointment.  The information for the facility is listed below.  [place], [place] [address] Phone number [phone], option 1 for CT

## 2015-11-12 NOTE — Telephone Encounter (Signed)
Patient returned call and is aware of the appointment and details pertaining to the appointment.

## 2015-11-14 ENCOUNTER — Telehealth: Payer: Self-pay

## 2015-11-14 NOTE — Telephone Encounter (Signed)
Pharm faxed notice that QVAR is not on pt's ins formulary. It looks like ins prefers Pulmicort Flexhaler. Shelley Como, do you want to Rx this instead?

## 2015-11-15 NOTE — Telephone Encounter (Signed)
Yes please Pamala Hurry. Philis Fendt, MS, PA-C 8:39 PM, 11/15/2015

## 2015-11-16 ENCOUNTER — Ambulatory Visit
Admission: RE | Admit: 2015-11-16 | Discharge: 2015-11-16 | Disposition: A | Discharge status: Home / Self Care | Payer: Medicare Other | Source: Ambulatory Visit | Attending: Physician Assistant | Admitting: Physician Assistant

## 2015-11-16 DIAGNOSIS — J984 Other disorders of lung: Secondary | ICD-10-CM

## 2015-11-16 MED ORDER — IOPAMIDOL (ISOVUE-300) INJECTION 61%
75.0000 mL | Freq: Once | INTRAVENOUS | Status: AC | PRN
  Administered 2015-11-16: 75 mL via INTRAVENOUS

## 2015-11-19 ENCOUNTER — Telehealth: Payer: Self-pay

## 2015-11-19 NOTE — Telephone Encounter (Signed)
Shelley Perez, I tried to order the Pulmicort for you but it comes in two strengths and I unfortunately am not licensed to choose which strength. Can you please send in Rx you would like to have pt on? Or give me exact strength/dose and I can do it. Thank you!

## 2015-11-19 NOTE — Telephone Encounter (Signed)
Pt states she was sent over for a scan on Saturday and would like to know results. Pt was told the Dr reviews and will give her call but she is really anxious Please call [phone]

## 2015-11-19 NOTE — Telephone Encounter (Signed)
Please review

## 2015-11-21 DIAGNOSIS — J441 Chronic obstructive pulmonary disease with (acute) exacerbation: Secondary | ICD-10-CM | POA: Insufficient documentation

## 2015-11-22 ENCOUNTER — Encounter: Payer: Self-pay | Admitting: Internal Medicine

## 2015-11-22 ENCOUNTER — Ambulatory Visit (INDEPENDENT_AMBULATORY_CARE_PROVIDER_SITE_OTHER): Payer: Medicare Other | Admitting: Internal Medicine

## 2015-11-22 VITALS — BP 114/80 | HR 94 | Ht 61.0 in | Wt 191.8 lb

## 2015-11-22 DIAGNOSIS — R938 Abnormal findings on diagnostic imaging of other specified body structures: Secondary | ICD-10-CM | POA: Diagnosis not present

## 2015-11-22 DIAGNOSIS — J449 Chronic obstructive pulmonary disease, unspecified: Secondary | ICD-10-CM

## 2015-11-22 DIAGNOSIS — R9389 Abnormal findings on diagnostic imaging of other specified body structures: Secondary | ICD-10-CM | POA: Insufficient documentation

## 2015-11-22 MED ORDER — BUDESONIDE-FORMOTEROL FUMARATE 160-4.5 MCG/ACT IN AERO: INHALATION_SPRAY | RESPIRATORY_TRACT | Status: DC

## 2015-11-22 NOTE — Progress Notes (Signed)
   Subjective:    Patient ID: Shelley Perez, female    DOB: 08/27/1950, 65 y.o.   MRN: [id number]  HPI    Review of Systems     Objective:   Physical Exam        Assessment & Plan:

## 2015-11-22 NOTE — Progress Notes (Signed)
Subjective:    Patient ID: Shelley Perez, female    DOB: 09/23/1950,   MRN: [id number]  HPI  53 yowf quit smoking 2015 with some sensation of midline chest discomfort brought on by smoking that resolved @ 172lb p quit smoking with no breathing problems though tendency for colds going to chest and worst episode middle of Feb 2017 >  sev ov's rx as flu with tamiflu / pred/ abx/ only some better so referred to pulmonary clinic 11/22/2015 by Dr  Philis Fendt at [facility]    11/22/2015 1st [facility] office visit/ Zakiah Beckerman   Chief Complaint  Patient presents with  . Pulmonary Consult    Referred by Dr. Philis Fendt. Pt c/o cough and SOB since she was dxed with Influenza in Feb 2017. Cough is prod with yellow sputum.    rx with doxy and prednisone x 4 days and qvar not better. Cough is worse at hs and in am and doe x more than slow walk on flat level = MMRC2 Having to use neb saba quid whereas prior to feb 2017 was not using it at all and prev on no maint rx   No obvious  day to day or daytime variabilty or assoc cp or chest tightness, subjective wheeze overt sinus or hb symptoms. No unusual exp hx or h/o childhood pna/ asthma or knowledge of premature birth.  Sleeping ok without nocturnal  or early am exacerbation  of respiratory  c/o's or need for noct saba. Also denies any obvious fluctuation of symptoms with weather or environmental changes or other aggravating or alleviating factors except as outlined above   Current Medications, Allergies, Complete Past Medical History, Past Surgical History, Family History, and Social History were reviewed in [employer] record.           Review of Systems  Constitutional: Negative for fever, chills and unexpected weight change.  HENT: Negative for congestion, dental problem, ear pain, nosebleeds, postnasal drip, rhinorrhea, sinus pressure, sneezing, sore throat, trouble swallowing and voice change.   Eyes: Negative for visual  disturbance.  Respiratory: Positive for cough. Negative for choking and shortness of breath.   Cardiovascular: Negative for chest pain and leg swelling.  Gastrointestinal: Negative for vomiting, abdominal pain and diarrhea.  Genitourinary: Negative for difficulty urinating.  Musculoskeletal: Negative for arthralgias.  Skin: Negative for rash.  Neurological: Negative for tremors, syncope and headaches.  Hematological: Does not bruise/bleed easily.       Objective:   Physical Exam  Elderly wf very tenous on details of symtpoms/ care to date  Wt Readings from Last 3 Encounters:  11/22/15 191 lb 12.8 oz (87 kg)  11/11/15 184 lb 12.8 oz (83.825 kg)  11/10/15 187 lb (84.823 kg)    Vital signs reviewed   HEENT: nl dentition, turbinates, and oropharynx. Nl external ear canals without cough reflex   NECK :  without JVD/Nodes/TM/ nl carotid upstrokes bilaterally   LUNGS: no acc muscle use,  Nl contour chest with insp and exp rhonchi bilaterally    CV:  RRR  no s3 or murmur or increase in P2, no edema   ABD:  soft and nontender with nl inspiratory excursion in the supine position. No bruits or organomegaly, bowel sounds nl  MS:  Nl gait/ ext warm without deformities, calf tenderness, cyanosis or clubbing No obvious joint restrictions   SKIN: warm and dry without lesions    NEURO:  alert, approp, nl sensorium with  no motor deficits  I personally reviewed images and agree with radiology impression as follows:  CT Chest   11/16/15 1. Small area tree-in-bud opacity in the right middle lobe. Imaging features suggest atypical infection.        Assessment & Plan:

## 2015-11-22 NOTE — Assessment & Plan Note (Addendum)
This is an extremely common benign condition which is usually  asymptomatic  in the elderly and does not warrant aggressive eval/ rx at this point unless there is a clinical correlation suggesting unaddressed pulmonary infection (purulent sputum, night sweats, unintended wt loss, doe) or evolution of  obvious changes on plain cxr (as opposed to serial CT, which is way over sensitive to make clinical decisions re intervention and treatment in the elderly, who tend to tolerate both dx and treatment poorly) .   Discussed in detail all the  indications, usual  risks and alternatives  relative to the benefits with patient who agrees to proceed with conservative f/u as outlined

## 2015-11-22 NOTE — Patient Instructions (Signed)
Plan A = Automatic =  symbicort 160 Take 2 puffs first thing in am and then another 2 puffs about 12 hours later.    Plan B = Backup  ok to use the nebulizer up to every 4 hours but if start needing it regularly call for immediate appointment   GERD (REFLUX)  is an extremely common cause of respiratory symptoms just like yours , many times with no obvious heartburn at all.    It can be treated with medication, but also with lifestyle changes including elevation of the head of your bed (ideally with 6 inch  bed blocks),  Smoking cessation, avoidance of late meals, excessive alcohol, and avoid fatty foods, chocolate, peppermint, colas, red wine, and acidic juices such as orange juice.  NO MINT OR MENTHOL PRODUCTS SO NO COUGH DROPS  USE SUGARLESS CANDY INSTEAD (Jolley ranchers or Stover's or Life Savers) or even ice chips will also do - the key is to swallow to prevent all throat clearing. NO OIL BASED VITAMINS - use powdered substitutes.  Please schedule a follow up office visit in 2  weeks, sooner if needed

## 2015-11-22 NOTE — Assessment & Plan Note (Addendum)
DDX of  difficult airways management almost all start with A and  include Adherence, Ace Inhibitors, Acid Reflux, Active Sinus Disease, Alpha 1 Antitripsin deficiency, Anxiety masquerading as Airways dz,  ABPA,  Allergy(esp in young), Aspiration (esp in elderly), Adverse effects of meds,  Active smokers, A bunch of PE's (a small clot burden can't cause this syndrome unless there is already severe underlying pulm or vascular dz with poor reserve) plus two Bs  = Bronchiectasis and Beta blocker use..and one C= CHF  Adherence is always the initial "prime suspect" and is a multilayered concern that requires a "trust but verify" approach in every patient - starting with knowing how to use medications, especially inhalers, correctly, keeping up with refills and understanding the fundamental difference between maintenance and prns vs those medications only taken for a very short course and then stopped and not refilled.  - The proper method of use, as well as anticipated side effects, of a metered-dose inhaler are discussed and demonstrated to the patient. Improved effectiveness after extensive coaching during this visit to a level of approximately 75 % from a baseline of 50 % > try symbicort 160 2bid and see if can reduce dep on steroids   ? Acid (or non-acid) GERD > always difficult to exclude as up to 75% of pts in some series report no assoc GI/ Heartburn symptoms> rec max   diet restrictions/ reviewed and instructions given in writing.   ? Active sinus dz > sinus ct next step  ? Allergy/asthma > should cover with symb 160/ consider w/u later  Total time devoted to counseling  = 35/66m review case with pt/ discussion of options/alternatives/ personally creating in presence of pt  then going over specific  Instructions directly with the pt including how to use all of the meds but in particular covering each new medication in detail (see avs)

## 2015-12-11 ENCOUNTER — Ambulatory Visit (INDEPENDENT_AMBULATORY_CARE_PROVIDER_SITE_OTHER): Payer: Medicare Other | Admitting: Internal Medicine

## 2015-12-11 ENCOUNTER — Encounter: Payer: Self-pay | Admitting: Internal Medicine

## 2015-12-11 VITALS — BP 112/70 | HR 77 | Ht 61.0 in | Wt 186.0 lb

## 2015-12-11 DIAGNOSIS — R05 Cough: Secondary | ICD-10-CM

## 2015-12-11 DIAGNOSIS — J449 Chronic obstructive pulmonary disease, unspecified: Secondary | ICD-10-CM | POA: Diagnosis not present

## 2015-12-11 DIAGNOSIS — R058 Other specified cough: Secondary | ICD-10-CM

## 2015-12-11 MED ORDER — PANTOPRAZOLE SODIUM 40 MG PO TBEC: 40.0000 mg | DELAYED_RELEASE_TABLET | Freq: Every day | ORAL | Status: DC

## 2015-12-11 MED ORDER — BUDESONIDE-FORMOTEROL FUMARATE 80-4.5 MCG/ACT IN AERO: INHALATION_SPRAY | RESPIRATORY_TRACT | Status: DC

## 2015-12-11 MED ORDER — FAMOTIDINE 20 MG PO TABS: ORAL_TABLET | ORAL | Status: DC

## 2015-12-11 NOTE — Patient Instructions (Addendum)
Plan A = Automatic =  symbicort 80 take 2 puffs first thing in am and then another 2 puffs about 12 hours later.   Work on inhaler technique:  relax and gently blow all the way out then take a nice smooth deep breath back in, triggering the inhaler at same time you start breathing in.  Hold for up to 5 seconds if you can. Blow out thru nose. Rinse and gargle with water when done   Plan B = Backup  ok to use the nebulizer up to every 4 hours but if start needing it regularly call for immediate appointment   GERD (REFLUX)  is an extremely common cause of respiratory symptoms just like yours , many times with no obvious heartburn at all.    It can be treated with medication, but also with lifestyle changes including elevation of the head of your bed (ideally with 6 inch  bed blocks),  Smoking cessation, avoidance of late meals, excessive alcohol, and avoid fatty foods, chocolate, peppermint, colas, red wine, and acidic juices such as orange juice.  NO MINT OR MENTHOL PRODUCTS SO NO COUGH DROPS  USE SUGARLESS CANDY INSTEAD (Jolley ranchers or Stover's or Life Savers) or even ice chips will also do - the key is to swallow to prevent all throat clearing. NO OIL BASED VITAMINS - use powdered substitutes.  Pantoprazole (protonix) 40 mg   Take  30-60 min before first meal of the day and Pepcid (famotidine)  20 mg one @  bedtime until return to office - this is the best way to tell whether stomach acid is contributing to your problem.    Please schedule a follow up office visit in 2 weeks, sooner if needed with pfts at [facility]

## 2015-12-11 NOTE — Progress Notes (Signed)
Subjective:    Patient ID: Shelley Perez, female    DOB: 1951/04/02,   MRN: [id number]    Brief patient profile:  65 yowf quit smoking 2015 with some sensation of midline chest discomfort brought on by smoking that resolved @ 172lb p quit smoking with no breathing problems though tendency for colds going to chest and persistent sore throat since ET 11/22/116 for R knee then  worst episode middle of Feb 2017 >  sev ov's rx as flu with tamiflu / pred/ abx/ only some better so referred to pulmonary clinic 11/22/2015 by Dr  Philis Fendt at [facility]    11/22/2015 1st [facility] office visit/ Milderd Manocchio   Chief Complaint  Patient presents with  . Pulmonary Consult    Referred by Dr. Philis Fendt. Pt c/o cough and SOB since she was dxed with Influenza in Feb 2017. Cough is prod with yellow sputum.    rx with doxy and prednisone x 4 days and qvar not better. Cough is worse at hs and in am and doe x more than slow walk on flat level = MMRC2 Having to use neb saba qid whereas prior to feb 2017 was not using it at all and prev on no maint rx  rec Plan A = Automatic =  symbicort 160 Take 2 puffs first thing in am and then another 2 puffs about 12 hours later.  Plan B = Backup  ok to use the nebulizer up to every 4 hours but if start needing it regularly call for immediate appointment GERD diet    12/11/2015  f/u ov/Lawayne Hartig re: ? Copd/ab vs uacs  maint on symb160 2bid  Chief Complaint  Patient presents with  . Follow-up    Pt states her breathing has improved and she is coughing less. She still has some chest congestion- worse on rainy days. Her cough is prod with minimal yellow sputum.  She has not needed albuterol.   having some noct coughing and congestion and some better with symbicort  Turns out had ET in Novemeber 2017 and wasn't told or doesn't remember that her spinal was apparently not adequate at time of knee surgery and she was converted to GA - since then  constant urge to clear throat but no  excess/ purulent sputum or mucus plugs and no noct events  No obvious day to day or daytime variability or assoc sob  or cp or chest tightness, subjective wheeze or overt sinus or hb symptoms. No unusual exp hx or h/o childhood pna/ asthma or knowledge of premature birth.  Sleeping ok without nocturnal  or early am exacerbation  of respiratory  c/o's or need for noct saba. Also denies any obvious fluctuation of symptoms with weather or environmental changes or other aggravating or alleviating factors except as outlined above   Current Medications, Allergies, Complete Past Medical History, Past Surgical History, Family History, and Social History were reviewed in [employer] record.  ROS  The following are not active complaints unless bolded sore throat, dysphagia, dental problems, itching, sneezing,  nasal congestion or excess/ purulent secretions, ear ache,   fever, chills, sweats, unintended wt loss, classically pleuritic or exertional cp, hemoptysis,  orthopnea pnd or leg swelling, presyncope, palpitations, abdominal pain, anorexia, nausea, vomiting, diarrhea  or change in bowel or bladder habits, change in stools or urine, dysuria,hematuria,  rash, arthralgias, visual complaints, headache, numbness, weakness or ataxia or problems with walking or coordination,  change in mood/affect or memory.  Objective:   Physical Exam  Elderly wf  nad / prominent pseudowheeze   12/11/2015        187   11/22/15 191 lb 12.8 oz (87 kg)  11/11/15 184 lb 12.8 oz (83.825 kg)  11/10/15 187 lb (84.823 kg)    Vital signs reviewed   HEENT: nl dentition, turbinates, and oropharynx. Nl external ear canals without cough reflex   NECK :  without JVD/Nodes/TM/ nl carotid upstrokes bilaterally   LUNGS: no acc muscle use,  Nl contour chest with very  Min  exp rhonchi bilaterally better with plm    CV:  RRR  no s3 or murmur or increase in P2, no edema   ABD:  soft and  nontender with nl inspiratory excursion in the supine position. No bruits or organomegaly, bowel sounds nl  MS:  Nl gait/ ext warm without deformities, calf tenderness, cyanosis or clubbing No obvious joint restrictions   SKIN: warm and dry without lesions    NEURO:  alert, approp, nl sensorium with  no motor deficits     I personally reviewed images and agree with radiology impression as follows:  CT Chest   11/16/15 1. Small area tree-in-bud opacity in the right middle lobe. Imaging features suggest atypical infection.        Assessment & Plan:

## 2015-12-12 DIAGNOSIS — R058 Other specified cough: Secondary | ICD-10-CM | POA: Insufficient documentation

## 2015-12-12 DIAGNOSIS — R05 Cough: Secondary | ICD-10-CM | POA: Insufficient documentation

## 2015-12-12 NOTE — Assessment & Plan Note (Signed)
Of the three most common causes of chronic cough, only one (GERD)  can actually cause the other two (asthma and post nasal drip syndrome)  and perpetuate the cylce of cough inducing airway trauma, inflammation, heightened sensitivity to reflux which is prompted by the cough itself via a cyclical mechanism.    This may partially respond to steroids and look like asthma and post nasal drainage but never erradicated completely unless the cough and the secondary reflux are eliminated, preferably both at the same time.  While not intuitively obvious, many patients with chronic low grade reflux do not cough until there is a secondary insult that disturbs the protective epithelial barrier and exposes sensitive nerve endings.  This can be viral or direct physical injury such as with an endotracheal tube(  as is likely the case here)  The point is that once this occurs, it is difficult to eliminate using anything but a maximally effective acid suppression regimen at least in the short run, accompanied by an appropriate diet to address non acid GERD.

## 2015-12-12 NOTE — Assessment & Plan Note (Signed)
11/22/2015   try symbicort 160 2bid  - 12/11/2015  After extensive coaching HFA effectiveness =  75%> try reducing to symb 80 2bid   Revised hx included ET before onset of symptom flare so could be AB but also likely uacs component (see separate a/p)   For now try symbicort 80 2bid as irritates upper airway less pending f/u pfts   I had an extended discussion with the patient reviewing all relevant studies completed to date and  lasting 15 to 20 minutes of a 25 minute visit    Each maintenance medication was reviewed in detail including most importantly the difference between maintenance and prns and under what circumstances the prns are to be triggered using an action plan format that is not reflected in the computer generated alphabetically organized AVS.    Please see instructions for details which were reviewed in writing and the patient given a copy highlighting the part that I personally wrote and discussed at today's ov.

## 2015-12-26 ENCOUNTER — Ambulatory Visit (HOSPITAL_COMMUNITY)
Admission: RE | Admit: 2015-12-26 | Discharge: 2015-12-26 | Disposition: A | Discharge status: Home / Self Care | Payer: Medicare Other | Source: Ambulatory Visit | Attending: Internal Medicine | Admitting: Internal Medicine

## 2015-12-26 DIAGNOSIS — J449 Chronic obstructive pulmonary disease, unspecified: Secondary | ICD-10-CM | POA: Insufficient documentation

## 2015-12-26 DIAGNOSIS — Z87891 Personal history of nicotine dependence: Secondary | ICD-10-CM | POA: Insufficient documentation

## 2015-12-26 LAB — PULMONARY FUNCTION TEST
DL/VA % pred: 106 %
DL/VA: 4.68 ml/min/mmHg/L
DLCO UNC % PRED: 85 %
DLCO UNC: 17.27 ml/min/mmHg
FEF 25-75 PRE: 1.6 L/s
FEF 25-75 Post: 2.64 L/sec
FEF2575-%CHANGE-POST: 64 %
FEF2575-%PRED-POST: 135 %
FEF2575-%Pred-Pre: 82 %
FEV1-%CHANGE-POST: 6 %
FEV1-%PRED-POST: 91 %
FEV1-%Pred-Pre: 85 %
FEV1-POST: 1.95 L
FEV1-Pre: 1.82 L
FEV1FVC-%CHANGE-POST: 5 %
FEV1FVC-%Pred-Pre: 104 %
FEV6-%CHANGE-POST: 0 %
FEV6-%PRED-POST: 84 %
FEV6-%PRED-PRE: 84 %
FEV6-PRE: 2.26 L
FEV6-Post: 2.26 L
FEV6FVC-%Change-Post: 0 %
FEV6FVC-%PRED-PRE: 104 %
FEV6FVC-%Pred-Post: 104 %
FVC-%CHANGE-POST: 1 %
FVC-%Pred-Post: 81 %
FVC-%Pred-Pre: 80 %
FVC-Post: 2.29 L
FVC-Pre: 2.26 L
POST FEV6/FVC RATIO: 100 %
PRE FEV6/FVC RATIO: 100 %
Post FEV1/FVC ratio: 85 %
Pre FEV1/FVC ratio: 81 %

## 2015-12-26 MED ORDER — ALBUTEROL SULFATE (2.5 MG/3ML) 0.083% IN NEBU
2.5000 mg | INHALATION_SOLUTION | Freq: Once | RESPIRATORY_TRACT | Status: AC
  Administered 2015-12-26: 2.5 mg via RESPIRATORY_TRACT

## 2015-12-28 ENCOUNTER — Ambulatory Visit (INDEPENDENT_AMBULATORY_CARE_PROVIDER_SITE_OTHER): Payer: Medicare Other | Admitting: Internal Medicine

## 2015-12-28 ENCOUNTER — Encounter: Payer: Self-pay | Admitting: Internal Medicine

## 2015-12-28 ENCOUNTER — Telehealth: Payer: Self-pay | Admitting: General Practice

## 2015-12-28 VITALS — BP 120/80 | HR 74 | Ht 61.0 in | Wt 186.6 lb

## 2015-12-28 DIAGNOSIS — R05 Cough: Secondary | ICD-10-CM

## 2015-12-28 DIAGNOSIS — J449 Chronic obstructive pulmonary disease, unspecified: Secondary | ICD-10-CM

## 2015-12-28 DIAGNOSIS — R058 Other specified cough: Secondary | ICD-10-CM

## 2015-12-28 NOTE — Telephone Encounter (Signed)
Would like to know if you would take on as patient?

## 2015-12-28 NOTE — Progress Notes (Signed)
Subjective:    Patient ID: Shelley Perez, female    DOB: 1951-06-28,   MRN: [id number]    Brief patient profile:  65 yowf quit smoking 2015 with some sensation of midline chest discomfort brought on by smoking that resolved @ 172lb p quit smoking with no breathing problems though tendency for colds going to chest and persistent sore throat since ET 11/22/116 for R knee then  worst episode middle of Feb 2017 >  sev ov's rx as flu with tamiflu / pred/ abx/ only some better so referred to pulmonary clinic 11/22/2015 by Dr  Philis Fendt at [facility]    11/22/2015 1st [facility] office visit/ [name]   Chief Complaint  Patient presents with  . Pulmonary Consult    Referred by Dr. Philis Fendt. Pt c/o cough and SOB since she was dxed with Influenza in Feb 2017. Cough is prod with yellow sputum.    rx with doxy and prednisone x 4 days and qvar not better. Cough is worse at hs and in am and doe x more than slow walk on flat level = MMRC2 Having to use neb saba qid whereas prior to feb 2017 was not using it at all and prev on no maint rx  rec Plan A = Automatic =  symbicort 160 Take 2 puffs first thing in am and then another 2 puffs about 12 hours later.  Plan B = Backup  ok to use the nebulizer up to every 4 hours but if start needing it regularly call for immediate appointment GERD diet    12/11/2015  f/u ov/[name] re: ? Copd/ab vs uacs  maint on symb160 2bid  Chief Complaint  Patient presents with  . Follow-up    Pt states her breathing has improved and she is coughing less. She still has some chest congestion- worse on rainy days. Her cough is prod with minimal yellow sputum.  She has not needed albuterol.   having some noct coughing and congestion and some better with symbicort  Turns out had ET in Novemeber 2016 and wasn't told or doesn't remember that her spinal was apparently not adequate at time of knee surgery and she was converted to GA - since then  constant urge to clear throat but no  excess/ purulent sputum or mucus plugs and no noct events rec Plan A = Automatic =  symbicort 80 take 2 puffs first thing in am and then another 2 puffs about 12 hours later.  Plan B = Backup  ok to use the nebulizer up to every 4 hours  Pantoprazole (protonix) 40 mg   Take  30-60 min before first meal of the day and Pepcid (famotidine)  20 mg one @  bedtime until return to office   12/28/2015  f/u ov/[name] re: GOLD 0 copd/ mild AB/uacs Chief Complaint  Patient presents with  . Follow-up    Breathing has improved. She has not had to use albuterol inhaler or neb.   can't take ppi regularly due to joints aching  Not taking pepcid at all    No obvious day to day or daytime variability or assoc sob  or cp or chest tightness, subjective wheeze or overt sinus or hb symptoms. No unusual exp hx or h/o childhood pna/ asthma or knowledge of premature birth.  Sleeping ok without nocturnal  or early am exacerbation  of respiratory  c/o's or need for noct saba. Also denies any obvious fluctuation of symptoms with weather or environmental changes or  other aggravating or alleviating factors except as outlined above   Current Medications, Allergies, Complete Past Medical History, Past Surgical History, Family History, and Social History were reviewed in [employer] record.  ROS  The following are not active complaints unless bolded sore throat, dysphagia, dental problems, itching, sneezing,  nasal congestion or excess/ purulent secretions, ear ache,   fever, chills, sweats, unintended wt loss, classically pleuritic or exertional cp, hemoptysis,  orthopnea pnd or leg swelling, presyncope, palpitations, abdominal pain, anorexia, nausea, vomiting, diarrhea  or change in bowel or bladder habits, change in stools or urine, dysuria,hematuria,  rash, arthralgias, visual complaints, headache, numbness, weakness or ataxia or problems with walking or coordination,  change in mood/affect or  memory.              Objective:   Physical Exam  Elderly wf  nad     12/28/2015       187  12/11/2015        187   11/22/15 191 lb 12.8 oz (87 kg)  11/11/15 184 lb 12.8 oz (83.825 kg)  11/10/15 187 lb (84.823 kg)    Vital signs reviewed   HEENT: nl dentition, turbinates, and oropharynx. Nl external ear canals without cough reflex   NECK :  without JVD/Nodes/TM/ nl carotid upstrokes bilaterally   LUNGS: no acc muscle use,  Nl contour chest with very  Min  exp rhonchi bilaterally     CV:  RRR  no s3 or murmur or increase in P2, no edema   ABD:  soft and nontender with nl inspiratory excursion in the supine position. No bruits or organomegaly, bowel sounds nl  MS:  Nl gait/ ext warm without deformities, calf tenderness, cyanosis or clubbing No obvious joint restrictions   SKIN: warm and dry without lesions    NEURO:  alert, approp, nl sensorium with  no motor deficits     I personally reviewed images and agree with radiology impression as follows:  CT Chest   11/16/15 1. Small area tree-in-bud opacity in the right middle lobe. Imaging features suggest atypical infection.        Assessment & Plan:   Outpatient Encounter Prescriptions as of 12/28/2015  Medication Sig  . albuterol (ACCUNEB) 1.25 MG/3ML nebulizer solution 1.25 mg every 6 (six) hours as needed for wheezing or shortness of breath.   Marland Kitchen aspirin 81 MG tablet Take 81 mg by mouth daily.  . budesonide-formoterol (SYMBICORT) 80-4.5 MCG/ACT inhaler Take 2 puffs first thing in am and then another 2 puffs about 12 hours later.  . docusate sodium (COLACE) 100 MG capsule Take 1 capsule (100 mg total) by mouth 2 (two) times daily. (Patient taking differently: Take 100 mg by mouth daily as needed. )  . furosemide (LASIX) 20 MG tablet Take 20 mg by mouth daily.  Marland Kitchen ipratropium (ATROVENT) 0.02 % nebulizer solution Take 2.5 mLs (0.5 mg total) by nebulization every 6 (six) hours as needed for wheezing or shortness of  breath.  . pantoprazole (PROTONIX) 40 MG tablet Take 1 tablet (40 mg total) by mouth daily. Take 30-60 min before first meal of the day  . Potassium Chloride Crys CR (KLOR-CON M20 PO) Take 1 tablet by mouth daily.  . sertraline (ZOLOFT) 100 MG tablet Take 100 mg by mouth daily.   Marland Kitchen albuterol (PROVENTIL HFA;VENTOLIN HFA) 108 (90 BASE) MCG/ACT inhaler Inhale 2 puffs into the lungs every 6 (six) hours as needed for wheezing or shortness of breath. Reported on 12/28/2015  . [  DISCONTINUED] famotidine (PEPCID) 20 MG tablet One at bedtime (Patient not taking: Reported on 12/28/2015)   No facility-administered encounter medications on file as of 12/28/2015.

## 2015-12-28 NOTE — Patient Instructions (Addendum)
You do have significant copd and unlikely you ever will   Continue symbicort 80 Take 2 puffs first thing in am and then another 2 puffs about 12 hours later if you think you need it.   Try pepcid ac 20 mg after bfast and after supper   GERD (REFLUX)  is an extremely common cause of respiratory symptoms just like yours , many times with no obvious heartburn at all.    It can be treated with medication, but also with lifestyle changes including elevation of the head of your bed (ideally with 6 inch  bed blocks),  Smoking cessation, avoidance of late meals, excessive alcohol, and avoid fatty foods, chocolate, peppermint, colas, red wine, and acidic juices such as orange juice.  NO MINT OR MENTHOL PRODUCTS SO NO COUGH DROPS  USE SUGARLESS CANDY INSTEAD (Jolley ranchers or Stover's or Life Savers) or even ice chips will also do - the key is to swallow to prevent all throat clearing. NO OIL BASED VITAMINS - use powdered substitutes.   If you are satisfied with your treatment plan,  let your doctor know and he/she can either refill your medications or you can return here when your prescription runs out.     If in any way you are not 100% satisfied,  please tell us.  If 100% better, tell your friends!  Pulmonary follow up is as needed

## 2015-12-29 NOTE — Assessment & Plan Note (Addendum)
11/22/2015   try symbicort 160 2bid  - 12/11/2015  After extensive coaching HFA effectiveness =  75%> try reducing to symb 80 2bid  - PFT's  12/28/2015  FEV1 1.95 (91 % ) ratio 85  p 6 % improvement from saba p no rx prior to study with DLCO  85 % corrects to 106 % for alv volume    So there is no copd here/ just tendency to AB which may not even require maint symb 80 at this point but ok to use "prn" at this point,the way it is used in [place] for asthma.   I had an extended summary final discussion with the patient reviewing all relevant studies completed to date and  lasting 15 to 20 minutes of a 25 minute visit    Each maintenance medication was reviewed in detail including most importantly the difference between maintenance and prns and under what circumstances the prns are to be triggered using an action plan format that is not reflected in the computer generated alphabetically organized AVS.    Please see instructions for details which were reviewed in writing and the patient given a copy highlighting the part that I personally wrote and discussed at today's ov.

## 2015-12-29 NOTE — Assessment & Plan Note (Signed)
S/p ET placement for knee surgery 06/2015   Improved despite not using gerd rx as directed ? Adverse effects from ppi so ok to just take pepcid bid and diet and f/u ent prn

## 2015-12-30 NOTE — Telephone Encounter (Signed)
yes

## 2015-12-31 NOTE — Telephone Encounter (Signed)
Left vm to call back to schedule appt.  °

## 2016-01-29 ENCOUNTER — Encounter: Payer: Self-pay | Admitting: Internal Medicine

## 2016-01-29 ENCOUNTER — Other Ambulatory Visit (INDEPENDENT_AMBULATORY_CARE_PROVIDER_SITE_OTHER): Payer: Medicare Other

## 2016-01-29 ENCOUNTER — Ambulatory Visit (INDEPENDENT_AMBULATORY_CARE_PROVIDER_SITE_OTHER): Payer: Medicare Other | Admitting: Internal Medicine

## 2016-01-29 VITALS — BP 100/70 | HR 62 | Temp 98.9°F | Resp 16 | Ht 61.0 in | Wt 185.0 lb

## 2016-01-29 DIAGNOSIS — D539 Nutritional anemia, unspecified: Secondary | ICD-10-CM

## 2016-01-29 DIAGNOSIS — M810 Age-related osteoporosis without current pathological fracture: Secondary | ICD-10-CM

## 2016-01-29 DIAGNOSIS — K2100 Gastro-esophageal reflux disease with esophagitis, without bleeding: Secondary | ICD-10-CM

## 2016-01-29 DIAGNOSIS — E2839 Other primary ovarian failure: Secondary | ICD-10-CM | POA: Diagnosis not present

## 2016-01-29 DIAGNOSIS — E559 Vitamin D deficiency, unspecified: Secondary | ICD-10-CM | POA: Insufficient documentation

## 2016-01-29 LAB — COMPREHENSIVE METABOLIC PANEL
ALK PHOS: 88 U/L (ref 39–117)
ALT: 16 U/L (ref 0–35)
AST: 16 U/L (ref 0–37)
Albumin: 4.4 g/dL (ref 3.5–5.2)
BILIRUBIN TOTAL: 0.5 mg/dL (ref 0.2–1.2)
BUN: 19 mg/dL (ref 6–23)
CO2: 29 mEq/L (ref 19–32)
Calcium: 9.4 mg/dL (ref 8.4–10.5)
Chloride: 103 mEq/L (ref 96–112)
Creatinine, Ser: 0.76 mg/dL (ref 0.40–1.20)
GFR: 81.1 mL/min (ref >60.00)
GLUCOSE: 97 mg/dL (ref 70–99)
Potassium: 4.3 mEq/L (ref 3.5–5.1)
SODIUM: 140 meq/L (ref 135–145)
TOTAL PROTEIN: 7.5 g/dL (ref 6.0–8.3)

## 2016-01-29 LAB — CBC WITH DIFFERENTIAL/PLATELET
BASOS ABS: 0.1 10*3/uL (ref 0.0–0.1)
Basophils Relative: 0.9 % (ref 0.0–3.0)
Eosinophils Absolute: 0.5 10*3/uL (ref 0.0–0.7)
Eosinophils Relative: 4.8 % (ref 0.0–5.0)
HCT: 39.6 % (ref 36.0–46.0)
Hemoglobin: 13.4 g/dL (ref 12.0–15.0)
LYMPHS ABS: 2.5 10*3/uL (ref 0.7–4.0)
Lymphocytes Relative: 26.7 % (ref 12.0–46.0)
MCHC: 33.8 g/dL (ref 30.0–36.0)
MCV: 86.1 fl (ref 78.0–100.0)
MONO ABS: 0.8 10*3/uL (ref 0.1–1.0)
Monocytes Relative: 8.3 % (ref 3.0–12.0)
NEUTROS PCT: 59.3 % (ref 43.0–77.0)
Neutro Abs: 5.6 10*3/uL (ref 1.4–7.7)
Platelets: 515 10*3/uL — ABNORMAL HIGH (ref 150.0–400.0)
RBC: 4.6 Mil/uL (ref 3.87–5.11)
RDW: 13.4 % (ref 11.5–15.5)
WBC: 9.4 10*3/uL (ref 4.0–10.5)

## 2016-01-29 LAB — FERRITIN: Ferritin: 54 ng/mL (ref 10.0–291.0)

## 2016-01-29 LAB — VITAMIN B12: VITAMIN B 12: 329 pg/mL (ref 211–911)

## 2016-01-29 LAB — TSH: TSH: 3.54 u[IU]/mL (ref 0.35–4.50)

## 2016-01-29 LAB — FOLATE: FOLATE: 11.5 ng/mL (ref >5.9)

## 2016-01-29 LAB — IBC PANEL
IRON: 61 ug/dL (ref 42–145)
SATURATION RATIOS: 16.6 % — AB (ref 20.0–50.0)
Transferrin: 262 mg/dL (ref 212.0–360.0)

## 2016-01-29 LAB — VITAMIN D 25 HYDROXY (VIT D DEFICIENCY, FRACTURES): VITD: 38.37 ng/mL (ref 30.00–100.00)

## 2016-01-29 MED ORDER — FOSTEUM PLUS PO CAPS: 1.0000 | ORAL_CAPSULE | Freq: Two times a day (BID) | ORAL | Status: DC

## 2016-01-29 MED ORDER — CHOLECALCIFEROL 50 MCG (2000 UT) PO TABS: 1.0000 | ORAL_TABLET | Freq: Every day | ORAL | Status: DC

## 2016-01-29 NOTE — Progress Notes (Signed)
Pre visit review using our clinic review tool, if applicable. No additional management support is needed unless otherwise documented below in the visit note. 

## 2016-01-29 NOTE — Progress Notes (Signed)
Subjective:  Patient ID: Shelley Perez, female    DOB: 1951-05-30  Age: 65 y.o. MRN: [id number]  CC: Gastroesophageal Reflux   HPI [name] presents for f/up and to est a new PCP.  She tells me she has a history of osteoporosis but it has been many years since she has had a bone density test performed. She is interested in treatment options. She also tells me that she has a history of vitamin D deficiency but has not recently taken a vitamin D supplement. She has gastroesophageal reflux disease and recently saw pulmonary for some atypical symptoms and has been started on a proton pump inhibitor which has helped her quite a bit. She denies laryngitis, odynophagia, dysphagia, loss of appetite, or weight loss. She had a recent set of labs that showed a mild normochromic normocytic anemia.  History Sharmila has a past medical history of Depression; Insomnia; Fibromyalgia; Hyperlipidemia; Complication of anesthesia; Asthma; COPD (chronic obstructive pulmonary disease) ([place]); Arthritis; Osteoporosis; History of kidney stones; pancreatitis; Anemia; and Anxiety.   She has past surgical history that includes Knee surgery (1978 / 1971); c sections; Cholecystectomy; Foot surgery; Shoulder surgery; Dental surgery; and Total knee arthroplasty (Right, 07/10/2015).   Her family history includes Asthma in her sister; Cancer in her father; Heart attack in her mother; Hyperlipidemia in her mother; Hypertension in her mother; Lung cancer in her sister.She reports that she quit smoking about 2 years ago. Her smoking use included Cigarettes. She has a 40 pack-year smoking history. She has never used smokeless tobacco. She reports that she does not drink alcohol or use illicit drugs.  Outpatient Prescriptions Prior to Visit  Medication Sig Dispense Refill  . albuterol (PROVENTIL HFA;VENTOLIN HFA) 108 (90 BASE) MCG/ACT inhaler Inhale 2 puffs into the lungs every 6 (six) hours as needed for wheezing or  shortness of breath. Reported on 12/28/2015    . aspirin 81 MG tablet Take 81 mg by mouth daily.    . budesonide-formoterol (SYMBICORT) 80-4.5 MCG/ACT inhaler Take 2 puffs first thing in am and then another 2 puffs about 12 hours later.    . docusate sodium (COLACE) 100 MG capsule Take 1 capsule (100 mg total) by mouth 2 (two) times daily. (Patient taking differently: Take 100 mg by mouth daily as needed. ) 10 capsule 0  . ipratropium (ATROVENT) 0.02 % nebulizer solution Take 2.5 mLs (0.5 mg total) by nebulization every 6 (six) hours as needed for wheezing or shortness of breath. 75 mL 12  . pantoprazole (PROTONIX) 40 MG tablet Take 1 tablet (40 mg total) by mouth daily. Take 30-60 min before first meal of the day 30 tablet 2  . Potassium Chloride Crys CR (KLOR-CON M20 PO) Take 1 tablet by mouth daily.    . sertraline (ZOLOFT) 100 MG tablet Take 100 mg by mouth daily.     . furosemide (LASIX) 20 MG tablet Take 20 mg by mouth daily.    Marland Kitchen albuterol (ACCUNEB) 1.25 MG/3ML nebulizer solution 1.25 mg every 6 (six) hours as needed for wheezing or shortness of breath.      No facility-administered medications prior to visit.    ROS Review of Systems  Constitutional: Negative.  Negative for fever, chills, diaphoresis, appetite change and fatigue.  HENT: Negative.  Negative for congestion, facial swelling, sinus pressure, sore throat and trouble swallowing.   Eyes: Negative.   Respiratory: Positive for wheezing. Negative for cough, choking, chest tightness, shortness of breath and stridor.   Cardiovascular: Negative.  Negative  for chest pain, palpitations and leg swelling.  Gastrointestinal: Negative.  Negative for nausea, abdominal pain, diarrhea, constipation and blood in stool.  Endocrine: Negative.   Genitourinary: Negative.   Musculoskeletal: Negative.  Negative for myalgias, back pain, joint swelling, arthralgias and neck pain.  Skin: Negative.  Negative for color change and rash.    Allergic/Immunologic: Negative.   Neurological: Negative.  Negative for dizziness, tremors, syncope, light-headedness, numbness and headaches.  Hematological: Negative.  Negative for adenopathy. Does not bruise/bleed easily.  Psychiatric/Behavioral: Negative.     Objective:  BP 100/70 mmHg  Pulse 62  Temp(Src) 98.9 F (37.2 C) (Oral)  Resp 16  Ht 5\' 1"  (1.549 m)  Wt 185 lb (83.915 kg)  BMI 34.97 kg/m2  SpO2 96%  Physical Exam  Constitutional: She is oriented to person, place, and time. No distress.  HENT:  Mouth/Throat: Oropharynx is clear and moist. No oropharyngeal exudate.  Eyes: Conjunctivae are normal. Right eye exhibits no discharge. Left eye exhibits no discharge. No scleral icterus.  Neck: Normal range of motion. Neck supple. No JVD present. No tracheal deviation present. No thyromegaly present.  Cardiovascular: Normal rate, regular rhythm, normal heart sounds and intact distal pulses.  Exam reveals no gallop and no friction rub.   No murmur heard. Pulmonary/Chest: Effort normal. No accessory muscle usage or stridor. No respiratory distress. She has no decreased breath sounds. She has wheezes in the right middle field and the left middle field. She has no rhonchi. She has no rales. She exhibits no tenderness.  Abdominal: Soft. Bowel sounds are normal. She exhibits no distension and no mass. There is no tenderness. There is no rebound and no guarding.  Musculoskeletal: Normal range of motion. She exhibits no edema or tenderness.  Lymphadenopathy:    She has no cervical adenopathy.  Neurological: She is oriented to person, place, and time.  Skin: Skin is warm and dry. No rash noted. She is not diaphoretic. No erythema. No pallor.  Vitals reviewed.    Lab Results  Component Value Date   WBC 9.4 01/29/2016   HGB 13.4 01/29/2016   HCT 39.6 01/29/2016   PLT 515.0* 01/29/2016   GLUCOSE 97 01/29/2016   ALT 16 01/29/2016   AST 16 01/29/2016   NA 140 01/29/2016   K 4.3  01/29/2016   CL 103 01/29/2016   CREATININE 0.76 01/29/2016   BUN 19 01/29/2016   CO2 29 01/29/2016   TSH 3.54 01/29/2016   INR 1.06 06/29/2015   Assessment & Plan:   Kristianna was seen today for gastroesophageal reflux.  Diagnoses and all orders for this visit:  Gastroesophageal reflux disease with esophagitis- she is no longer anemic and has had a good response to the PPI, will continue -     CBC with Differential/Platelet; Future  Vitamin D deficiency- I prefer that her vitamin D level be in the 50-70 range, it is currently in the mid 30s, I've asked her to start a vitamin D supplement to support her bone health -     VITAMIN D 25 Hydroxy (Vit-D Deficiency, Fractures); Future -     DG Bone Density; Future -     Cholecalciferol 2000 units TABS; Take 1 tablet (2,000 Units total) by mouth daily.  Osteoporosis- she is due for DEXA scan, she tells me that her osteoporosis was rather significant so I have asked her to start taking a supplement to support bone health, will refer if she needs more aggressive treatment for osteoporosis. -  Dietary Management Product (FOSTEUM PLUS) CAPS; Take 1 capsule by mouth 2 (two) times daily. -     TSH; Future -     VITAMIN D 25 Hydroxy (Vit-D Deficiency, Fractures); Future -     DG Bone Density; Future -     Cholecalciferol 2000 units TABS; Take 1 tablet (2,000 Units total) by mouth daily.  Menopause ovarian failure- BMD ordered  Deficiency anemia- her H/H are normal now, all of her vitamin levels are normal, will continue to follow. -     Comprehensive metabolic panel; Future -     CBC with Differential/Platelet; Future -     IBC panel; Future -     Vitamin B12; Future -     Folate; Future -     Ferritin; Future -     Vitamin B1; Future  I have discontinued Ms. Gall's furosemide. I am also having her start on [place] and Cholecalciferol. Additionally, I am having her maintain her sertraline, albuterol, albuterol, docusate sodium,  ipratropium, Potassium Chloride Crys CR (KLOR-CON M20 PO), aspirin, budesonide-formoterol, pantoprazole, potassium chloride, and famciclovir.  Meds ordered this encounter  Medications  . potassium chloride (K-DUR) 10 MEQ tablet    Sig:     Refill:  3  . famciclovir (FAMVIR) 500 MG tablet    Sig: Take 500 mg by mouth 3 (three) times daily as needed. Reported on 01/29/2016  . Dietary Management Product (FOSTEUM PLUS) CAPS    Sig: Take 1 capsule by mouth 2 (two) times daily.    Dispense:  60 capsule    Refill:  11  . Cholecalciferol 2000 units TABS    Sig: Take 1 tablet (2,000 Units total) by mouth daily.    Dispense:  90 tablet    Refill:  3     Follow-up: Return in about 4 months (around 05/30/2016).  Scarlette Calico, MD

## 2016-01-29 NOTE — Patient Instructions (Signed)
Anemia, Nonspecific Anemia is a condition in which the concentration of red blood cells or hemoglobin in the blood is below normal. Hemoglobin is a substance in red blood cells that carries oxygen to the tissues of the body. Anemia results in not enough oxygen reaching these tissues.  CAUSES  Common causes of anemia include:   Excessive bleeding. Bleeding may be internal or external. This includes excessive bleeding from periods (in women) or from the intestine.   Poor nutrition.   Chronic kidney, thyroid, and liver disease.  Bone marrow disorders that decrease red blood cell production.  Cancer and treatments for cancer.  HIV, AIDS, and their treatments.  Spleen problems that increase red blood cell destruction.  Blood disorders.  Excess destruction of red blood cells due to infection, medicines, and autoimmune disorders. SIGNS AND SYMPTOMS   Minor weakness.   Dizziness.   Headache.  Palpitations.   Shortness of breath, especially with exercise.   Paleness.  Cold sensitivity.  Indigestion.  Nausea.  Difficulty sleeping.  Difficulty concentrating. Symptoms may occur suddenly or they may develop slowly.  DIAGNOSIS  Additional blood tests are often needed. These help your health care provider determine the best treatment. Your health care provider will check your stool for blood and look for other causes of blood loss.  TREATMENT  Treatment varies depending on the cause of the anemia. Treatment can include:   Supplements of iron, vitamin B12, or folic acid.   Hormone medicines.   A blood transfusion. This may be needed if blood loss is severe.   Hospitalization. This may be needed if there is significant continual blood loss.   Dietary changes.  Spleen removal. HOME CARE INSTRUCTIONS Keep all follow-up appointments. It often takes many weeks to correct anemia, and having your health care provider check on your condition and your response to  treatment is very important. SEEK IMMEDIATE MEDICAL CARE IF:   You develop extreme weakness, shortness of breath, or chest pain.   You become dizzy or have trouble concentrating.  You develop heavy vaginal bleeding.   You develop a rash.   You have bloody or black, tarry stools.   You faint.   You vomit up blood.   You vomit repeatedly.   You have abdominal pain.  You have a fever or persistent symptoms for more than 2-3 days.   You have a fever and your symptoms suddenly get worse.   You are dehydrated.  MAKE SURE YOU:  Understand these instructions.  Will watch your condition.  Will get help right away if you are not doing well or get worse.   This information is not intended to replace advice given to you by your health care provider. Make sure you discuss any questions you have with your health care provider.   Document Released: 09/11/2004 Document Revised: 04/06/2013 Document Reviewed: 01/28/2013 Elsevier Interactive Patient Education 2016 Else[employer] Inc[employer]  

## 2016-02-03 LAB — VITAMIN B1: Vitamin B1 (Thiamine): 14 nmol/L (ref 8–30)

## 2016-02-14 ENCOUNTER — Telehealth: Payer: Self-pay | Admitting: Internal Medicine

## 2016-02-14 NOTE — Telephone Encounter (Signed)
Pt would like an alternative. Told pt to give it another week for cologuard to come in, takes some time

## 2016-02-14 NOTE — Telephone Encounter (Signed)
Pt called and said that the meds that Dr Ronnald Ramp gave her samples of is making her joints hurt and have really bad hot flashes.  She can not take it.   She also want to fu on the collogued kit that was she was suppose to rec?

## 2016-02-15 NOTE — Telephone Encounter (Signed)
Is she concerned about fosteum plus? If so, she can stop taking it.

## 2016-02-15 NOTE — Telephone Encounter (Signed)
Yes for the fosteum she was informed to stop taking she wanted to know if there was something else she could take

## 2016-02-17 NOTE — Telephone Encounter (Signed)
No, not right now 

## 2016-02-18 ENCOUNTER — Other Ambulatory Visit: Payer: Self-pay | Admitting: *Deleted

## 2016-02-18 MED ORDER — SERTRALINE HCL 100 MG PO TABS: 100.0000 mg | ORAL_TABLET | Freq: Every day | ORAL | Status: DC

## 2016-02-18 NOTE — Telephone Encounter (Signed)
Left  sg on triage Friday afternoon requesting refill on her Sertraline. Called pt this am inorm refills sent to [facility]...Johny Chess

## 2016-02-22 ENCOUNTER — Telehealth: Payer: Self-pay | Admitting: Internal Medicine

## 2016-02-22 ENCOUNTER — Ambulatory Visit (INDEPENDENT_AMBULATORY_CARE_PROVIDER_SITE_OTHER)
Admission: RE | Admit: 2016-02-22 | Discharge: 2016-02-22 | Disposition: A | Discharge status: Home / Self Care | Payer: Medicare Other | Source: Ambulatory Visit | Attending: Internal Medicine | Admitting: Internal Medicine

## 2016-02-22 DIAGNOSIS — M810 Age-related osteoporosis without current pathological fracture: Secondary | ICD-10-CM | POA: Diagnosis not present

## 2016-02-22 DIAGNOSIS — R070 Pain in throat: Principal | ICD-10-CM

## 2016-02-22 DIAGNOSIS — E559 Vitamin D deficiency, unspecified: Secondary | ICD-10-CM

## 2016-02-22 DIAGNOSIS — G8929 Other chronic pain: Secondary | ICD-10-CM

## 2016-02-22 NOTE — Telephone Encounter (Signed)
Sorry to hear that - ok to refer to [facility]   I just looked at her last instructions and they should say "you do not have significant copd and never will"

## 2016-02-22 NOTE — Telephone Encounter (Signed)
Referral order placed. Pt aware. Nothing further needed.

## 2016-02-22 NOTE — Telephone Encounter (Signed)
Patient states that her throat has not gotten any better and she would like referral to ENT.  Her throat is still hurting very badly. Dr. Melvyn Novas, ok for referral? Please advise.  Patient Instructions     You do have significant copd and unlikely you ever will   Continue symbicort 80 Take 2 puffs first thing in am and then another 2 puffs about 12 hours later if you think you need it.   Try pepcid ac 20 mg after bfast and after supper   GERD (REFLUX) is an extremely common cause of respiratory symptoms just like yours , many times with no obvious heartburn at all.   It can be treated with medication, but also with lifestyle changes including elevation of the head of your bed (ideally with 6 inch bed blocks), Smoking cessation, avoidance of late meals, excessive alcohol, and avoid fatty foods, chocolate, peppermint, colas, red wine, and acidic juices such as orange juice.  NO MINT OR MENTHOL PRODUCTS SO NO COUGH DROPS  USE SUGARLESS CANDY INSTEAD (Jolley ranchers or Stover's or Life Savers) or even ice chips will also do - the key is to swallow to prevent all throat clearing. NO OIL BASED VITAMINS - use powdered substitutes.  If you are satisfied with your treatment plan, let your doctor know and he/she can either refill your medications or you can return here when your prescription runs out.   If in any way you are not 100% satisfied, please tell us. If 100% better, tell your friends!  Pulmonary follow up is as needed

## 2016-02-27 ENCOUNTER — Encounter: Payer: Self-pay | Admitting: Internal Medicine

## 2016-02-27 LAB — HM DEXA SCAN

## 2016-03-06 DIAGNOSIS — L570 Actinic keratosis: Secondary | ICD-10-CM | POA: Diagnosis not present

## 2016-03-06 DIAGNOSIS — L700 Acne vulgaris: Secondary | ICD-10-CM | POA: Diagnosis not present

## 2016-03-06 DIAGNOSIS — K130 Diseases of lips: Secondary | ICD-10-CM | POA: Diagnosis not present

## 2016-03-06 DIAGNOSIS — L304 Erythema intertrigo: Secondary | ICD-10-CM | POA: Diagnosis not present

## 2016-03-11 DIAGNOSIS — Z1212 Encounter for screening for malignant neoplasm of rectum: Secondary | ICD-10-CM | POA: Diagnosis not present

## 2016-03-11 DIAGNOSIS — Z1211 Encounter for screening for malignant neoplasm of colon: Secondary | ICD-10-CM | POA: Diagnosis not present

## 2016-03-12 LAB — COLOGUARD: Cologuard: NEGATIVE

## 2016-03-19 DIAGNOSIS — K219 Gastro-esophageal reflux disease without esophagitis: Secondary | ICD-10-CM | POA: Diagnosis not present

## 2016-03-19 DIAGNOSIS — R05 Cough: Secondary | ICD-10-CM | POA: Diagnosis not present

## 2016-03-19 DIAGNOSIS — R490 Dysphonia: Secondary | ICD-10-CM | POA: Diagnosis not present

## 2016-03-20 LAB — COLOGUARD: COLOGUARD: NEGATIVE

## 2016-03-27 ENCOUNTER — Encounter: Payer: Self-pay | Admitting: Internal Medicine

## 2016-03-28 ENCOUNTER — Other Ambulatory Visit: Payer: Self-pay | Admitting: Internal Medicine

## 2016-04-02 ENCOUNTER — Telehealth: Payer: Self-pay | Admitting: Internal Medicine

## 2016-04-02 NOTE — Telephone Encounter (Signed)
Would like to know if Stef called her today.

## 2016-04-03 NOTE — Telephone Encounter (Signed)
Contacted pt. Informed that I did not call yesterday. Pt informed of cologuard results.

## 2016-05-27 ENCOUNTER — Ambulatory Visit: Payer: Medicare Other | Admitting: Internal Medicine

## 2016-06-05 ENCOUNTER — Ambulatory Visit (INDEPENDENT_AMBULATORY_CARE_PROVIDER_SITE_OTHER): Payer: Medicare Other | Admitting: Internal Medicine

## 2016-06-05 ENCOUNTER — Ambulatory Visit (INDEPENDENT_AMBULATORY_CARE_PROVIDER_SITE_OTHER)
Admission: RE | Admit: 2016-06-05 | Discharge: 2016-06-05 | Disposition: A | Discharge status: Home / Self Care | Payer: Medicare Other | Source: Ambulatory Visit | Attending: Internal Medicine | Admitting: Internal Medicine

## 2016-06-05 VITALS — BP 140/90 | HR 57 | Temp 97.7°F | Resp 16 | Ht 61.0 in | Wt 186.8 lb

## 2016-06-05 DIAGNOSIS — M25562 Pain in left knee: Secondary | ICD-10-CM | POA: Insufficient documentation

## 2016-06-05 DIAGNOSIS — M792 Neuralgia and neuritis, unspecified: Secondary | ICD-10-CM | POA: Insufficient documentation

## 2016-06-05 DIAGNOSIS — M25572 Pain in left ankle and joints of left foot: Secondary | ICD-10-CM | POA: Insufficient documentation

## 2016-06-05 DIAGNOSIS — M79672 Pain in left foot: Secondary | ICD-10-CM | POA: Diagnosis not present

## 2016-06-05 DIAGNOSIS — G5792 Unspecified mononeuropathy of left lower limb: Secondary | ICD-10-CM

## 2016-06-05 DIAGNOSIS — Z23 Encounter for immunization: Secondary | ICD-10-CM

## 2016-06-05 DIAGNOSIS — M79662 Pain in left lower leg: Secondary | ICD-10-CM | POA: Diagnosis not present

## 2016-06-05 MED ORDER — PREGABALIN 75 MG PO CAPS: 75.0000 mg | ORAL_CAPSULE | Freq: Every day | ORAL | 0 refills | Status: DC

## 2016-06-05 NOTE — Patient Instructions (Signed)
Leg Cramps Leg cramps occur when a muscle or muscles tighten and you have no control over this tightening (involuntary muscle contraction). Muscle cramps can develop in any muscle, but the most common place is in the calf muscles of the leg. Those cramps can occur during exercise or when you are at rest. Leg cramps are painful, and they may last for a few seconds to a few minutes. Cramps may return several times before they finally stop. Usually, leg cramps are not caused by a serious medical problem. In many cases, the cause is not known. Some common causes include:  Overexertion.  Overuse from repetitive motions, or doing the same thing over and over.  Remaining in a certain position for a long period of time.  Improper preparation, form, or technique while performing a sport or an activity.  Dehydration.  Injury.  Side effects of some medicines.  Abnormally low levels of the salts and ions in your blood (electrolytes), especially potassium and calcium. These levels could be low if you are taking water pills (diuretics) or if you are pregnant. HOME CARE INSTRUCTIONS Watch your condition for any changes. Taking the following actions may help to lessen any discomfort that you are feeling:  Stay well-hydrated. Drink enough fluid to keep your urine clear or pale yellow.  Try massaging, stretching, and relaxing the affected muscle. Do this for several minutes at a time.  For tight or tense muscles, use a warm towel, heating pad, or hot shower water directed to the affected area.  If you are sore or have pain after a cramp, applying ice to the affected area may relieve discomfort.  Put ice in a plastic bag.  Place a towel between your skin and the bag.  Leave the ice on for 20 minutes, 2-3 times per day.  Avoid strenuous exercise for several days if you have been having frequent leg cramps.  Make sure that your diet includes the essential minerals for your muscles to work  normally.  Take medicines only as directed by your health care provider. SEEK MEDICAL CARE IF:  Your leg cramps get more severe or more frequent, or they do not improve over time.  Your foot becomes cold, numb, or blue.   This information is not intended to replace advice given to you by your health care provider. Make sure you discuss any questions you have with your health care provider.   Document Released: 09/11/2004 Document Revised: 12/19/2014 Document Reviewed: 07/12/2014 Elsevier Interactive Patient Education 2016 Elsevier Inc.   

## 2016-06-05 NOTE — Progress Notes (Signed)
Subjective:  Patient ID: Shelley Perez, female    DOB: 18-Feb-1951  Age: 65 y.o. MRN: [id number]  CC: Leg Pain   HPI Kaitlynne Zyskowski presents for LLE pain for several months, she complains of pain over her left shin and left foot that worsens at night while she is sleeping but she denies claudication. She also reports intermittent tingling over the left anterior shin. She denies injury or trauma, swelling, rash, wounds, numbness, weakness, or LBP.  Outpatient Medications Prior to Visit  Medication Sig Dispense Refill  . albuterol (ACCUNEB) 1.25 MG/3ML nebulizer solution 1.25 mg every 6 (six) hours as needed for wheezing or shortness of breath.     Marland Kitchen aspirin 81 MG tablet Take 81 mg by mouth daily.    . budesonide-formoterol (SYMBICORT) 80-4.5 MCG/ACT inhaler Take 2 puffs first thing in am and then another 2 puffs about 12 hours later.    . Cholecalciferol 2000 units TABS Take 1 tablet (2,000 Units total) by mouth daily. 90 tablet 3  . docusate sodium (COLACE) 100 MG capsule Take 1 capsule (100 mg total) by mouth 2 (two) times daily. (Patient taking differently: Take 100 mg by mouth daily as needed. ) 10 capsule 0  . ipratropium (ATROVENT) 0.02 % nebulizer solution Take 2.5 mLs (0.5 mg total) by nebulization every 6 (six) hours as needed for wheezing or shortness of breath. 75 mL 12  . pantoprazole (PROTONIX) 40 MG tablet Take 1 tablet (40 mg total) by mouth daily. Take 30-60 min before first meal of the day 30 tablet 2  . potassium chloride (K-DUR) 10 MEQ tablet TAKE 1 TABLET(10 MEQ) BY MOUTH DAILY 30 tablet 5  . sertraline (ZOLOFT) 100 MG tablet Take 1 tablet (100 mg total) by mouth daily. 90 tablet 3  . albuterol (PROVENTIL HFA;VENTOLIN HFA) 108 (90 BASE) MCG/ACT inhaler Inhale 2 puffs into the lungs every 6 (six) hours as needed for wheezing or shortness of breath. Reported on 12/28/2015    . Potassium Chloride Crys CR (KLOR-CON M20 PO) Take 1 tablet by mouth daily.    . famciclovir  (FAMVIR) 500 MG tablet Take 500 mg by mouth 3 (three) times daily as needed. Reported on 01/29/2016     No facility-administered medications prior to visit.     ROS Review of Systems  Constitutional: Negative.  Negative for appetite change, chills, diaphoresis, fatigue and fever.  HENT: Negative.  Negative for trouble swallowing.   Eyes: Negative.   Respiratory: Negative.  Negative for cough, choking, chest tightness, shortness of breath and stridor.   Cardiovascular: Negative.  Negative for chest pain, palpitations and leg swelling.  Gastrointestinal: Negative for abdominal pain, blood in stool, constipation, nausea and vomiting.  Endocrine: Negative.   Genitourinary: Negative.  Negative for difficulty urinating, flank pain and frequency.  Musculoskeletal: Positive for arthralgias. Negative for back pain, gait problem, joint swelling, myalgias and neck stiffness.  Skin: Negative.  Negative for color change and rash.  Allergic/Immunologic: Negative.   Neurological: Negative.  Negative for dizziness, weakness, numbness and headaches.  Hematological: Negative.  Negative for adenopathy. Does not bruise/bleed easily.  Psychiatric/Behavioral: Negative.     Objective:  BP 140/90 (BP Location: Left Arm, Patient Position: Sitting, Cuff Size: Normal)   Pulse (!) 57   Temp 97.7 F (36.5 C) (Oral)   Resp 16   Ht 5\' 1"  (1.549 m)   Wt 186 lb 12 oz (84.7 kg)   SpO2 97%   BMI 35.29 kg/m   BP Readings from Last  3 Encounters:  06/05/16 140/90  01/29/16 100/70  12/28/15 120/80    Wt Readings from Last 3 Encounters:  06/05/16 186 lb 12 oz (84.7 kg)  01/29/16 185 lb (83.9 kg)  12/28/15 186 lb 9.6 oz (84.6 kg)    Physical Exam  Constitutional: She is oriented to person, place, and time. No distress.  HENT:  Mouth/Throat: Oropharynx is clear and moist. No oropharyngeal exudate.  Eyes: Conjunctivae are normal. Right eye exhibits no discharge. Left eye exhibits no discharge. No scleral  icterus.  Neck: Normal range of motion. Neck supple. No JVD present. No tracheal deviation present. No thyromegaly present.  Cardiovascular: Normal rate, regular rhythm, normal heart sounds and intact distal pulses.  Exam reveals no gallop and no friction rub.   No murmur heard. Pulmonary/Chest: Effort normal and breath sounds normal. No stridor. No respiratory distress. She has no wheezes. She has no rales. She exhibits no tenderness.  Abdominal: Soft. Bowel sounds are normal. She exhibits no distension and no mass. There is no tenderness. There is no rebound and no guarding.  Musculoskeletal: Normal range of motion. She exhibits no edema or deformity.       Left ankle: Normal. She exhibits no swelling. No tenderness. Achilles tendon normal.       Left lower leg: She exhibits tenderness and bony tenderness. She exhibits no swelling, no edema, no deformity and no laceration.       Legs:      Left foot: Normal. There is normal range of motion, no tenderness, no bony tenderness, no swelling, normal capillary refill, no crepitus, no deformity and no laceration.  Lymphadenopathy:    She has no cervical adenopathy.  Neurological: She is oriented to person, place, and time.  Skin: Skin is warm and dry. No rash noted. She is not diaphoretic. No erythema. No pallor.  Vitals reviewed.   Lab Results  Component Value Date   WBC 9.4 01/29/2016   HGB 13.4 01/29/2016   HCT 39.6 01/29/2016   PLT 515.0 (H) 01/29/2016   GLUCOSE 97 01/29/2016   ALT 16 01/29/2016   AST 16 01/29/2016   NA 140 01/29/2016   K 4.3 01/29/2016   CL 103 01/29/2016   CREATININE 0.76 01/29/2016   BUN 19 01/29/2016   CO2 29 01/29/2016   TSH 3.54 01/29/2016   INR 1.06 06/29/2015    Dg Bone Density  Result Date: 02/25/2016 Date of study: 02/22/2016 Exam: DUAL X-RAY ABSORPTIOMETRY (DXA) FOR BONE MINERAL DENSITY (BMD) Instrument: [employer] [facility] Provider: PCP Indication: follow up for osteoporosis Comparison:  none (please note that it is not possible to compare data from different instruments) Clinical data: Pt is a postmenopausal 65 y.o. female with previous h/o foot fracture. On vitamin D. Results:  Lumbar spine (L1-L4) Femoral neck (FN) T-score -2.0 RFN:-2.1 LFN:-2.0 Assessment: the BMD is low according to the [facility] classification for osteoporosis (see below). Fracture risk: moderate FRAX score: 10 year major osteoporotic risk: 17.1%. 10 year hip fracture risk: 2.7%. These are under the thresholds for treatment of 20% and 3%, respectively. Comments: the technical quality of the study is good. Evaluation for secondary causes should be considered if clinically indicated. Recommend optimizing calcium (1200 mg/day) and vitamin D (800 IU/day) intake. Followup: Repeat BMD is appropriate after 2 years. WHO criteria for diagnosis of osteoporosis in postmenopausal women and in men 70 y/o or older: - normal: T-score -1.0 to + 1.0 - osteopenia/low bone density: T-score between -2.5 and -1.0 - osteoporosis: T-score  below -2.5 - severe osteoporosis: T-score below -2.5 with history of fragility fracture Note: although not part of the WHO classification, the presence of a fragility fracture, regardless of the T-score, should be considered diagnostic of osteoporosis, provided other causes for the fracture have been excluded. Treatment: The National Osteoporosis Foundation recommends that treatment be considered in postmenopausal women and men age 59 or older with: 1. Hip or vertebral (clinical or morphometric) fracture 2. T-score of - 2.5 or lower at the spine or hip 3. 10-year fracture probability by FRAX of at least 20% for a major osteoporotic fracture and 3% for a hip fracture Philemon Kingdom, MD [facility]    Assessment & Plan:   Kindyl was seen today for leg pain.  Diagnoses and all orders for this visit:  Neuralgia of left lower extremity- She has an isolated neuralgia, will treat with Lyrica -      pregabalin (LYRICA) 75 MG capsule; Take 1 capsule (75 mg total) by mouth at bedtime.  Arthralgia of left lower leg- plain films are unremarkable, she probably has mild degenerative joint disease, she will try symptom relief with Tylenol -     DG Tibia/Fibula Left; Future -     DG Foot Complete Left; Future  Need for prophylactic vaccination and inoculation against influenza -     Flu vaccine HIGH DOSE PF (Fluzone High dose)  Need for prophylactic vaccination with combined diphtheria-tetanus-pertussis (DTP) vaccine -     Tdap vaccine greater than or equal to 7yo IM  Pain of joint of left ankle and foot- as above   I have discontinued Ms. Feuerstein's Potassium Chloride Crys CR (KLOR-CON M20 PO). I am also having her start on pregabalin. Additionally, I am having her maintain her albuterol, docusate sodium, ipratropium, aspirin, budesonide-formoterol, pantoprazole, famciclovir, Cholecalciferol, sertraline, and potassium chloride.  Meds ordered this encounter  Medications  . pregabalin (LYRICA) 75 MG capsule    Sig: Take 1 capsule (75 mg total) by mouth at bedtime.    Dispense:  56 capsule    Refill:  0     Follow-up: Return in about 6 weeks (around 07/17/2016).  Scarlette Calico, MD

## 2016-06-05 NOTE — Progress Notes (Signed)
Pre visit review using our clinic review tool, if applicable. No additional management support is needed unless otherwise documented below in the visit note. 

## 2016-06-10 ENCOUNTER — Encounter: Payer: Self-pay | Admitting: Internal Medicine

## 2016-08-13 DIAGNOSIS — Z96651 Presence of right artificial knee joint: Secondary | ICD-10-CM | POA: Diagnosis not present

## 2016-08-13 DIAGNOSIS — Z471 Aftercare following joint replacement surgery: Secondary | ICD-10-CM | POA: Diagnosis not present

## 2016-09-29 ENCOUNTER — Telehealth: Payer: Self-pay | Admitting: Internal Medicine

## 2016-09-29 NOTE — Telephone Encounter (Signed)
Spoke with patient regarding awv. Patient stated that she has several appts in February and would like to receive a call back at the end of March to schedule an appt.

## 2016-10-04 ENCOUNTER — Emergency Department (INDEPENDENT_AMBULATORY_CARE_PROVIDER_SITE_OTHER): Payer: Medicare Other

## 2016-10-04 ENCOUNTER — Emergency Department
Admission: EM | Admit: 2016-10-04 | Discharge: 2016-10-04 | Disposition: A | Discharge status: Home / Self Care | Payer: Medicare Other | Source: Home / Self Care | Attending: Family Medicine | Admitting: Family Medicine

## 2016-10-04 ENCOUNTER — Encounter: Payer: Self-pay | Admitting: Emergency Medicine

## 2016-10-04 DIAGNOSIS — M25532 Pain in left wrist: Secondary | ICD-10-CM

## 2016-10-04 DIAGNOSIS — S63502A Unspecified sprain of left wrist, initial encounter: Secondary | ICD-10-CM | POA: Diagnosis not present

## 2016-10-04 DIAGNOSIS — M25531 Pain in right wrist: Secondary | ICD-10-CM | POA: Diagnosis not present

## 2016-10-04 MED ORDER — HYDROCODONE-ACETAMINOPHEN 5-325 MG PO TABS: 1.0000 | ORAL_TABLET | Freq: Four times a day (QID) | ORAL | 0 refills | Status: DC | PRN

## 2016-10-04 MED ORDER — KETOROLAC TROMETHAMINE 60 MG/2ML IM SOLN
60.0000 mg | Freq: Once | INTRAMUSCULAR | Status: AC
  Administered 2016-10-04: 60 mg via INTRAMUSCULAR

## 2016-10-04 NOTE — Discharge Instructions (Signed)
Wear wrist splint.  Apply ice pack for 20 to 30 minutes, 3 to 4 times daily  Continue until pain and swelling decrease.  May take Ibuprofen.  Elevate wrist at night.

## 2016-10-04 NOTE — ED Triage Notes (Signed)
Patient was walking dog yesterday and it took off at a run jerking her left wrist and hand. She used ice yesterday and took leftover rx; no OTC today; she is guarding wrist.

## 2016-10-04 NOTE — ED Provider Notes (Signed)
[facility]    CSN: [id number] Arrival date & time: 10/04/16  1457     History   Chief Complaint Chief Complaint  Patient presents with  . Wrist Pain    left    HPI Shelley Perez is a 66 y.o. female.   Patient was walking her dog yesterday, holding a leash in her left hand.  The dog suddenly charged ahead, jerking her left hand and wrist.  She has had persistent pain/swelling in her wrist.   The history is provided by the patient.  Wrist Pain  This is a new problem. The problem occurs constantly. The problem has not changed since onset.Exacerbated by: movement of left wrist. Nothing relieves the symptoms. Treatments tried: ice pack. The treatment provided no relief.    Past Medical History:  Diagnosis Date  . Anemia   . Anxiety   . Arthritis   . Asthma   . Complication of anesthesia    "they gave me too much and I had to stay 3 days " 2005 after choley   . COPD (chronic obstructive pulmonary disease) ([place])   . Depression   . Fibromyalgia   . History of kidney stones   . Hx of pancreatitis    with gallstones  . Hyperlipidemia   . Insomnia   . Osteoporosis     Patient Active Problem List   Diagnosis Date Noted  . Neuralgia of left lower extremity 06/05/2016  . Arthralgia of left lower leg 06/05/2016  . Pain of joint of left ankle and foot 06/05/2016  . Gastroesophageal reflux disease with esophagitis 01/29/2016  . Osteoporosis 01/29/2016  . Vitamin D deficiency 01/29/2016  . Menopause ovarian failure 01/29/2016  . Upper airway cough syndrome 12/12/2015  . Abnormal CT of the chest 11/22/2015  . COPD GOLD 0  11/21/2015  . Obese 07/11/2015    Past Surgical History:  Procedure Laterality Date  . c sections    . CHOLECYSTECTOMY    . DENTAL SURGERY    . FOOT SURGERY    . KNEE SURGERY  1978 / 1971  . SHOULDER SURGERY    . 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 Authorizing Provider  albuterol (ACCUNEB) 1.25 MG/3ML nebulizer solution 1.25 mg every 6 (six) hours as needed for wheezing or shortness of breath.  10/26/14 06/05/16  Historical Provider, MD  aspirin 81 MG tablet Take 81 mg by mouth daily.    Historical Provider, MD  budesonide-formoterol (SYMBICORT) 80-4.5 MCG/ACT inhaler Take 2 puffs first thing in am and then another 2 puffs about 12 hours later. 12/11/15   Tanda Rockers, MD  Cholecalciferol 2000 units TABS Take 1 tablet (2,000 Units total) by mouth daily. 01/29/16   Janith Lima, MD  docusate sodium (COLACE) 100 MG capsule Take 1 capsule (100 mg total) by mouth 2 (two) times daily. Patient taking differently: Take 100 mg by mouth daily as needed.  07/11/15   Danae Orleans, PA-C  famciclovir (FAMVIR) 500 MG tablet Take 500 mg by mouth 3 (three) times daily as needed. Reported on 01/29/2016    Historical Provider, MD  HYDROcodone-acetaminophen (NORCO/VICODIN) 5-325 MG tablet Take 1 tablet by mouth  every 6 (six) hours as needed for severe pain. Take one by mouth at bedtime as needed for pain 10/04/16   Kandra Nicolas, MD  ipratropium (ATROVENT) 0.02 % nebulizer solution Take 2.5 mLs (0.5 mg total) by nebulization every 6 (six) hours as needed for wheezing or shortness of breath. 11/11/15   Tereasa Coop, PA-C  pantoprazole (PROTONIX) 40 MG tablet Take 1 tablet (40 mg total) by mouth daily. Take 30-60 min before first meal of the day 12/11/15   Tanda Rockers, MD  potassium chloride (K-DUR) 10 MEQ tablet TAKE 1 TABLET(10 MEQ) BY MOUTH DAILY 03/28/16   Janith Lima, MD  pregabalin (LYRICA) 75 MG capsule Take 1 capsule (75 mg total) by mouth at bedtime. 06/05/16   Janith Lima, MD  sertraline (ZOLOFT) 100 MG tablet Take 1 tablet (100 mg total)  by mouth daily. 02/18/16   Janith Lima, MD    Family History Family History  Problem Relation Age of Onset  . Hyperlipidemia Mother   . Hypertension Mother   . Heart attack Mother   . Cancer Father   . Asthma Sister   . Lung cancer Sister     smoked    Social History Social History  Substance Use Topics  . Smoking status: Former Smoker    Packs/day: 1.00    Years: 40.00    Types: Cigarettes    Quit date: 06/28/2013  . Smokeless tobacco: Never Used  . Alcohol use No     Allergies   Rosuvastatin calcium; Atorvastatin; Codeine; Crestor [rosuvastatin]; Prozac [fluoxetine hcl]; Latex; and Sulfa antibiotics   Review of Systems Review of Systems  All other systems reviewed and are negative.    Physical Exam Triage Vital Signs ED Triage Vitals  Enc Vitals Group     BP 10/04/16 1613 136/81     Pulse Rate 10/04/16 1613 64     Resp 10/04/16 1613 16     Temp 10/04/16 1613 98.2 F (36.8 C)     Temp Source 10/04/16 1613 Oral     SpO2 10/04/16 1613 95 %     Weight 10/04/16 1614 191 lb (86.6 kg)     Height 10/04/16 1614 5\' 1"  (1.549 m)     Head Circumference --      Peak Flow --      Pain Score 10/04/16 1617 8     Pain Loc --      Pain Edu? --      Excl. in [place]? --    No data found.   Updated Vital Signs BP 136/81 (BP Location: Left Arm)   Pulse 64   Temp 98.2 F (36.8 C) (Oral)   Resp 16   Ht 5\' 1"  (1.549 m)   Wt 191 lb (86.6 kg)   SpO2 95%   BMI 36.09 kg/m   Visual Acuity Right Eye Distance:   Left Eye Distance:   Bilateral Distance:    Right Eye Near:   Left Eye Near:    Bilateral Near:     Physical Exam  Constitutional: She appears well-developed and well-nourished. No distress.  HENT:  Head: Atraumatic.  Eyes: Pupils are equal, round, and reactive to light.  Cardiovascular: Normal rate.   Pulmonary/Chest: Effort normal.  Musculoskeletal:       Left wrist: She exhibits decreased range of motion, tenderness, bony tenderness and swelling.  She exhibits no crepitus, no deformity and no laceration.       Arms:  Hands: Left wrist has decreased range of motion.  There is swelling and tenderness to palpation over the ulnar aspect of wrist.  Distal neurovascular function is intact.   Neurological: She is alert.  Skin: Skin is warm and dry.  Nursing note and vitals reviewed.    UC Treatments / Results  Labs (all labs ordered are listed, but only abnormal results are displayed) Labs Reviewed - No data to display  EKG  EKG Interpretation None       Radiology Dg Wrist Complete Left  Result Date: 10/04/2016 CLINICAL DATA:  Injured LEFT wrist, was walking her dog on 09/26/2016 and dog pulled leash, has been having medial and lateral pain since EXAM: LEFT WRIST - COMPLETE 3+ VIEW COMPARISON:  02/02/2010 FINDINGS: Osseous demineralization. Joint space narrowing at the first [facility] joint with spur formation. Additional joint space narrowing at STT joint. No acute fracture, dislocation, or bone destruction. IMPRESSION: Degenerative changes at radial border of LEFT carpus. No acute osseous abnormalities. Electronically Signed   By: Lavonia Dana M.D.   On: 10/04/2016 16:45    Procedures Procedures (including critical care time)  Medications Ordered in UC Medications  ketorolac (TORADOL) injection 60 mg (60 mg Intramuscular Given 10/04/16 1611)     Initial Impression / Assessment and Plan / UC Course  I have reviewed the triage vital signs and the nursing notes.  Pertinent labs & imaging results that were available during my care of the patient were reviewed by me and considered in my medical decision making (see chart for details).    Concern for possible TFCC injury. Administered Toradol 60mg  IM  Applied wrist splint.  Rx for Lortab. Wear wrist splint.  Apply ice pack for 20 to 30 minutes, 3 to 4 times daily  Continue until pain and swelling decrease.  May take Ibuprofen.  Elevate wrist at night. Followup with Dr. Aundria Mems or Dr. Lynne Leader ([facility]) as soon as possible for further evaluation.    Final Clinical Impressions(s) / UC Diagnoses   Final diagnoses:  Sprain of left wrist, initial encounter    New Prescriptions New Prescriptions   HYDROCODONE-ACETAMINOPHEN (NORCO/VICODIN) 5-325 MG TABLET    Take 1 tablet by mouth every 6 (six) hours as needed for severe pain. Take one by mouth at bedtime as needed for pain     Kandra Nicolas, MD 10/06/16 1729

## 2016-10-06 ENCOUNTER — Ambulatory Visit (INDEPENDENT_AMBULATORY_CARE_PROVIDER_SITE_OTHER): Payer: Medicare Other | Admitting: Family Medicine

## 2016-10-06 ENCOUNTER — Telehealth: Payer: Self-pay

## 2016-10-06 ENCOUNTER — Encounter: Payer: Self-pay | Admitting: Family Medicine

## 2016-10-06 VITALS — BP 133/63 | HR 64 | Wt 194.0 lb

## 2016-10-06 DIAGNOSIS — S6992XA Unspecified injury of left wrist, hand and finger(s), initial encounter: Secondary | ICD-10-CM

## 2016-10-06 NOTE — Patient Instructions (Signed)
Thank you for coming in today. Continue the splint.  Recheck with me on  Friday Feb 23rd.    Cast or Splint Care, Adult Casts and splints are supports that are worn to protect broken bones and other injuries. A cast or splint may hold a bone still and in the correct position while it heals. Casts and splints may also help ease pain, swelling, and muscle spasms. A cast is a hardened support that is usually made of fiberglass or plaster. It is custom-fit to the body and it offers more protection than a splint. It cannot be taken off and put back on. A splint is a type of soft support that is usually made from cloth and elastic. It can be adjusted or taken off as needed. You may need a cast or a splint if you:  Have a broken bone.  Have a soft-tissue injury.  Need to keep an injured body part from moving (keep it immobile) after surgery. How is this treated? If you have a cast:  Do not stick anything inside the cast to scratch your skin. Sticking something in the cast increases your risk of infection.  Check the skin around the cast every day. Tell your health care provider about any concerns.  You may put lotion on dry skin around the edges of the cast. Do not put lotion on the skin underneath the cast.  Keep the cast clean.  If the cast is not waterproof:  Do not let it get wet.  Cover it with a watertight covering when you take a bath or a shower. If you have a splint:  Wear it as told by your health care provider. Remove it only as told by your health care provider.  Loosen the splint if your fingers or toes tingle, become numb, or turn cold and blue.  Keep the splint clean.  If the splint is not waterproof:  Do not let it get wet.  Cover it with a watertight covering when you take a bath or a shower. Bathing  Do not take baths or swim until your health care provider approves. Ask your health care provider if you can take showers. You may only be allowed to take sponge  baths for bathing.  If your cast or splint is not waterproof, cover it with a watertight covering when you take a bath or shower. Managing pain, stiffness, and swelling  Move your fingers or toes often to avoid stiffness and to lessen swelling.  Raise (elevate) the injured area above the level of your heart while sitting or lying down. Safety  Do not use the injured limb to support your body weight until your health care provider says that it is okay.  Use crutches or other assistive devices as told by your health care provider. General instructions  Do not put pressure on any part of the cast or splint until it is fully hardened. This may take several hours.  Return to your normal activities as told by your health care provider. Ask your health care provider what activities are safe for you.  Take over-the-counter and prescription medicines only as told by your health care provider.  Keep all follow-up visits as told by your health care provider. This is important. Contact a health care provider if:  Your cast or splint gets damaged.  The skin around the cast gets red or raw.  The skin under the cast is extremely itchy or painful.  Your cast or splint feels very uncomfortable.  Your cast or splint is too tight or too loose.  Your cast becomes wet or it develops a soft spot or area.  You get an object stuck under your cast. Get help right away if:  Your pain is getting worse.  The injured area tingles, becomes numb, or turns cold and blue.  The part of your body above or below the cast is swollen and discolored.  You cannot feel or move your fingers or toes.  There is fluid leaking through the cast.  You have severe pain or pressure under the cast.  You have trouble breathing.  You have shortness of breath.  You have chest pain. This information is not intended to replace advice given to you by your health care provider. Make sure you discuss any questions you  have with your health care provider. Document Released: 08/01/2000 Document Revised: 02/23/2016 Document Reviewed: 01/26/2016 Elsevier Interactive Patient Education  2017 [employer].

## 2016-10-06 NOTE — Telephone Encounter (Signed)
Pt is being seen at [facility] today to FU for a wrist injury. Pt states that the clinical data section of the radiology report from 10/04/16 incorrectly notes that her injury occurred on 09/26/16 when it actually occurred on 10/03/16. Pt would like this corrected. Thank you.

## 2016-10-06 NOTE — Progress Notes (Signed)
Shelley Perez is a 66 y.o. female who presents to [facility] today for wrist injury. Patient was seen in urgent care on the 17th for wrist injury. She notes on the 16th she was holding her dog who suddenly lurched to the side resulting in a extension injury to her wrist and hand on the left side. She notes pain and swelling at the [place] hand and wrist. She has pain with flexion of the third fourth and fifth digits. She denies any radiating pain weakness or numbness distally however. The x-ray on the 17th at urgent care showed DJD but otherwise was unremarkable for fracture. She was placed in a Velcro wrist: Which she notes does not control her pain very well. She was given Norco for pain as well which helps some.   Past Medical History:  Diagnosis Date  . Anemia   . Anxiety   . Arthritis   . Asthma   . Complication of anesthesia    "they gave me too much and I had to stay 3 days " 2005 after choley   . COPD (chronic obstructive pulmonary disease) ([place])   . Depression   . Fibromyalgia   . History of kidney stones   . Hx of pancreatitis    with gallstones  . Hyperlipidemia   . Insomnia   . Osteoporosis    Past Surgical History:  Procedure Laterality Date  . c sections    . CHOLECYSTECTOMY    . DENTAL SURGERY    . FOOT SURGERY    . KNEE SURGERY  1978 / 1971  . SHOULDER SURGERY    . TOTAL KNEE ARTHROPLASTY Right 07/10/2015   Procedure: RIGHT TOTAL KNEE ARTHROPLASTY;  Surgeon: Paralee Cancel, MD;  Location: [facility];  Service: Orthopedics;  Laterality: Right;   Social History  Substance Use Topics  . Smoking status: Former Smoker    Packs/day: 1.00    Years: 40.00    Types: Cigarettes    Quit date: 06/28/2013  . Smokeless tobacco: Never Used  . Alcohol use No   family history includes Asthma in her sister; Cancer in her father; Heart attack in her mother; Hyperlipidemia in her mother; Hypertension in her mother; Lung cancer in her  sister.  ROS:  No headache, visual changes, nausea, vomiting, diarrhea, constipation, dizziness, abdominal pain, skin rash, fevers, chills, night sweats, weight loss, swollen lymph nodes, body aches, joint swelling, muscle aches, chest pain, shortness of breath, mood changes, visual or auditory hallucinations.    Medications: Current Outpatient Prescriptions  Medication Sig Dispense Refill  . aspirin 81 MG tablet Take 81 mg by mouth daily.    . budesonide-formoterol (SYMBICORT) 80-4.5 MCG/ACT inhaler Take 2 puffs first thing in am and then another 2 puffs about 12 hours later.    . docusate sodium (COLACE) 100 MG capsule Take 1 capsule (100 mg total) by mouth 2 (two) times daily. (Patient taking differently: Take 100 mg by mouth daily as needed. ) 10 capsule 0  . HYDROcodone-acetaminophen (NORCO/VICODIN) 5-325 MG tablet Take 1 tablet by mouth every 6 (six) hours as needed for severe pain. Take one by mouth at bedtime as needed for pain 15 tablet 0  . ipratropium (ATROVENT) 0.02 % nebulizer solution Take 2.5 mLs (0.5 mg total) by nebulization every 6 (six) hours as needed for wheezing or shortness of breath. 75 mL 12  . potassium chloride (K-DUR) 10 MEQ tablet TAKE 1 TABLET(10 MEQ) BY MOUTH DAILY 30 tablet 5  .  sertraline (ZOLOFT) 100 MG tablet Take 1 tablet (100 mg total) by mouth daily. 90 tablet 3  . albuterol (ACCUNEB) 1.25 MG/3ML nebulizer solution 1.25 mg every 6 (six) hours as needed for wheezing or shortness of breath.      No current facility-administered medications for this visit.    Allergies  Allergen Reactions  . Rosuvastatin Calcium     Other reaction(s): Other Muscle and joint pain  . Atorvastatin     Muscle and joint pain  . Codeine Nausea Only and Other (See Comments)    Stomach pains  . Crestor [Rosuvastatin]     Muscle and joint pain  . Prozac [Fluoxetine Hcl] Other (See Comments)    Changes her personality  . Latex Rash  . Sulfa Antibiotics Rash     Exam:    BP 133/63   Pulse 64   Wt 194 lb (88 kg)   BMI 36.66 kg/m  General: Well Developed, well nourished, and in no acute distress.  Neuro/Psych: Alert and oriented x3, extra-ocular muscles intact, able to move all 4 extremities, sensation grossly intact. Skin: Warm and dry, no rashes noted.  Respiratory: Not using accessory muscles, speaking in full sentences, trachea midline.  Cardiovascular: Pulses palpable, no extremity edema. Abdomen: Does not appear distended. MSK: Left hand swollen and ecchymosis at the [place] hand and wrist. Tender to palpation throughout. Pulses capillary refill and sensation are intact. Patient has painful but intact flexion strength at the DIP PIP and MCP at the fourth fifth for and second digits. She has intact extension strength is well throughout. The forearm and elbow is nontender.  Patient was placed into a well-formed ulnar gutter splint  No results found for this or any previous visit (from the past 48 hour(s)). Dg Wrist Complete Left  Result Date: 10/04/2016 CLINICAL DATA:  Injured LEFT wrist, was walking her dog on 09/26/2016 and dog pulled leash, has been having medial and lateral pain since EXAM: LEFT WRIST - COMPLETE 3+ VIEW COMPARISON:  02/02/2010 FINDINGS: Osseous demineralization. Joint space narrowing at the first [facility] joint with spur formation. Additional joint space narrowing at STT joint. No acute fracture, dislocation, or bone destruction. IMPRESSION: Degenerative changes at radial border of LEFT carpus. No acute osseous abnormalities. Electronically Signed   By: Lavonia Dana M.D.   On: 10/04/2016 16:45      Assessment and Plan: 66 y.o. female with hand and wrist injury. Somewhat concerning for [place] finger type injury however she has intact flexion strength at the DIP therefore I don't think there is a full-thickness tendon tear. Plan for ulnar gutter splint in an anatomical hand position of function. Recheck Friday (Four days).      No  orders of the defined types were placed in this encounter.   Discussed warning signs or symptoms. Please see discharge instructions. Patient expresses understanding.  CC: Scarlette Calico, MD

## 2016-10-10 ENCOUNTER — Encounter: Payer: Self-pay | Admitting: Family Medicine

## 2016-10-10 ENCOUNTER — Ambulatory Visit (INDEPENDENT_AMBULATORY_CARE_PROVIDER_SITE_OTHER): Payer: Medicare Other | Admitting: Family Medicine

## 2016-10-10 ENCOUNTER — Ambulatory Visit (INDEPENDENT_AMBULATORY_CARE_PROVIDER_SITE_OTHER): Payer: Medicare Other

## 2016-10-10 VITALS — BP 159/69 | HR 59

## 2016-10-10 DIAGNOSIS — M19042 Primary osteoarthritis, left hand: Secondary | ICD-10-CM | POA: Diagnosis not present

## 2016-10-10 DIAGNOSIS — M79642 Pain in left hand: Secondary | ICD-10-CM

## 2016-10-10 DIAGNOSIS — M79641 Pain in right hand: Secondary | ICD-10-CM | POA: Insufficient documentation

## 2016-10-10 NOTE — Patient Instructions (Signed)
Thank you for coming in today. Recheck in 1-2 weeks or sooner if needed.  Continue the splint/brace.  I think you strained the tendons in the hand.  Use the splint almost all the time.  You may remove to shower but do not do a lot of grabbing or squeezing with the left hand with the splint off.

## 2016-10-10 NOTE — Progress Notes (Signed)
Shelley Perez is a 66 y.o. female who presents to [facility]: [facility] today for follow-up left hand injury. Patient was seen on February 19 after injuring her left hand. She was placed in an ulnar gutter splint. She returns today and notes that her symptoms are improved. She notes continued bruising and pain however. No radiating pain weakness or numbness fevers or chills.   Past Medical History:  Diagnosis Date  . Anemia   . Anxiety   . Arthritis   . Asthma   . Complication of anesthesia    "they gave me too much and I had to stay 3 days " 2005 after choley   . COPD (chronic obstructive pulmonary disease) ([place])   . Depression   . Fibromyalgia   . History of kidney stones   . Hx of pancreatitis    with gallstones  . Hyperlipidemia   . Insomnia   . Osteoporosis    Past Surgical History:  Procedure Laterality Date  . c sections    . CHOLECYSTECTOMY    . DENTAL SURGERY    . FOOT SURGERY    . KNEE SURGERY  1978 / 1971  . SHOULDER SURGERY    . TOTAL KNEE ARTHROPLASTY Right 07/10/2015   Procedure: RIGHT TOTAL KNEE ARTHROPLASTY;  Surgeon: Paralee Cancel, MD;  Location: [facility];  Service: Orthopedics;  Laterality: Right;   Social History  Substance Use Topics  . Smoking status: Former Smoker    Packs/day: 1.00    Years: 40.00    Types: Cigarettes    Quit date: 06/28/2013  . Smokeless tobacco: Never Used  . Alcohol use No   family history includes Asthma in her sister; Cancer in her father; Heart attack in her mother; Hyperlipidemia in her mother; Hypertension in her mother; Lung cancer in her sister.  ROS as above:  Medications: Current Outpatient Prescriptions  Medication Sig Dispense Refill  . aspirin 81 MG tablet Take 81 mg by mouth daily.    . budesonide-formoterol (SYMBICORT) 80-4.5 MCG/ACT inhaler Take 2 puffs first thing in am and then another 2 puffs  about 12 hours later.    . docusate sodium (COLACE) 100 MG capsule Take 1 capsule (100 mg total) by mouth 2 (two) times daily. (Patient taking differently: Take 100 mg by mouth daily as needed. ) 10 capsule 0  . HYDROcodone-acetaminophen (NORCO/VICODIN) 5-325 MG tablet Take 1 tablet by mouth every 6 (six) hours as needed for severe pain. Take one by mouth at bedtime as needed for pain 15 tablet 0  . ipratropium (ATROVENT) 0.02 % nebulizer solution Take 2.5 mLs (0.5 mg total) by nebulization every 6 (six) hours as needed for wheezing or shortness of breath. 75 mL 12  . potassium chloride (K-DUR) 10 MEQ tablet TAKE 1 TABLET(10 MEQ) BY MOUTH DAILY 30 tablet 5  . sertraline (ZOLOFT) 100 MG tablet Take 1 tablet (100 mg total) by mouth daily. 90 tablet 3  . albuterol (ACCUNEB) 1.25 MG/3ML nebulizer solution 1.25 mg every 6 (six) hours as needed for wheezing or shortness of breath.      No current facility-administered medications for this visit.    Allergies  Allergen Reactions  . Rosuvastatin Calcium     Other reaction(s): Other Muscle and joint pain  . Atorvastatin     Muscle and joint pain  . Codeine Nausea Only and Other (See Comments)    Stomach pains  . Crestor [Rosuvastatin]  Muscle and joint pain  . Prozac [Fluoxetine Hcl] Other (See Comments)    Changes her personality  . Latex Rash  . Sulfa Antibiotics Rash    Health Maintenance Health Maintenance  Topic Date Due  . Hepatitis C Screening  1951/08/16  . HIV Screening  10/13/1965  . PNA vac Low Risk Adult (1 of 2 - PCV13) 05/13/2016  . MAMMOGRAM  05/03/2017  . PAP SMEAR  05/01/2018  . Fecal DNA (Cologuard)  03/21/2019  . TETANUS/TDAP  06/05/2026  . INFLUENZA VACCINE  Completed  . DEXA SCAN  Completed     Exam:  BP (!) 159/69   Pulse (!) 59  Gen: Well NAD Left hand: Ecchymosis and swelling present. Patient continues to have tenderness at her fourth and fifth digits. She has improved active flexion and extension  strength of the DIP and PIP and MCP bilaterally   No results found for this or any previous visit (from the past 56 hour(s)). Dg Hand Complete Left  Result Date: 10/10/2016 CLINICAL DATA:  66 year old female with left hand and wrist pain after walking dog 1 week ago. Subsequent encounter. EXAM: LEFT HAND - COMPLETE 3+ VIEW COMPARISON:  Left wrist series 21718 FINDINGS: Moderate to severe chronic degeneration at the basal joint of the left thumb including joint space loss, subchondral sclerosis, and osteophytosis. The distal radius and ulna remain intact. Carpal bone alignment is stable. Metacarpals and phalanges are intact. There is moderate to severe IP joint degeneration, worst in the second DIP and fifth PIP and DIP joints. IMPRESSION: 1. 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 2.  No acute osseous abnormality identified. Electronically Signed   By: Genevie Ann M.D.   On: 10/10/2016 10:58      Assessment and Plan: 66 y.o. female with hand pain. I think patient likely strained her flexor tendons. I'm doubtful for a true tendon tear such as a [place] finger and she has intact flexion and extension strength. Plan for continued splinting and recheck in 1 week or so.   Orders Placed This Encounter  Procedures  . DG Hand Complete Left    Standing Status:   Future    Number of Occurrences:   1    Standing Expiration Date:   12/08/2017    Order Specific Question:   Reason for Exam (SYMPTOM  OR DIAGNOSIS REQUIRED)    Answer:   ulnar hand and wrist pain    Order Specific Question:   Preferred imaging location?    Answer:   [facility]   No orders of the defined types were placed in this encounter.    Discussed warning signs or symptoms. Please see discharge instructions. Patient expresses understanding.

## 2016-10-17 ENCOUNTER — Ambulatory Visit (INDEPENDENT_AMBULATORY_CARE_PROVIDER_SITE_OTHER): Payer: Medicare Other | Admitting: Family Medicine

## 2016-10-17 ENCOUNTER — Encounter: Payer: Self-pay | Admitting: Family Medicine

## 2016-10-17 ENCOUNTER — Ambulatory Visit (INDEPENDENT_AMBULATORY_CARE_PROVIDER_SITE_OTHER): Payer: Medicare Other

## 2016-10-17 DIAGNOSIS — M25532 Pain in left wrist: Secondary | ICD-10-CM | POA: Diagnosis not present

## 2016-10-17 DIAGNOSIS — S6992XA Unspecified injury of left wrist, hand and finger(s), initial encounter: Secondary | ICD-10-CM | POA: Diagnosis not present

## 2016-10-17 DIAGNOSIS — M189 Osteoarthritis of first carpometacarpal joint, unspecified: Secondary | ICD-10-CM

## 2016-10-17 MED ORDER — DICLOFENAC SODIUM 1 % TD GEL: 2.0000 g | Freq: Four times a day (QID) | TRANSDERMAL | 11 refills | Status: DC

## 2016-10-17 NOTE — Progress Notes (Signed)
Shelley Perez is a 66 y.o. female who presents to [facility] today for left wrist injury and hand pain. Patient has been immobilized now since February 19 and is improving. She notes continued hand pain and stiffness and especially pain at the ulnar wrist. Overall she is feeling much better and denies any radiating pain weakness or numbness.   Past Medical History:  Diagnosis Date  . Anemia   . Anxiety   . Arthritis   . Asthma   . Complication of anesthesia    "they gave me too much and I had to stay 3 days " 2005 after choley   . COPD (chronic obstructive pulmonary disease) ([place])   . Depression   . Fibromyalgia   . History of kidney stones   . Hx of pancreatitis    with gallstones  . Hyperlipidemia   . Insomnia   . Osteoporosis    Past Surgical History:  Procedure Laterality Date  . c sections    . CHOLECYSTECTOMY    . DENTAL SURGERY    . FOOT SURGERY    . KNEE SURGERY  1978 / 1971  . SHOULDER SURGERY    . TOTAL KNEE ARTHROPLASTY Right 07/10/2015   Procedure: RIGHT TOTAL KNEE ARTHROPLASTY;  Surgeon: Paralee Cancel, MD;  Location: [facility];  Service: Orthopedics;  Laterality: Right;   Social History  Substance Use Topics  . Smoking status: Former Smoker    Packs/day: 1.00    Years: 40.00    Types: Cigarettes    Quit date: 06/28/2013  . Smokeless tobacco: Never Used  . Alcohol use No     ROS:  As above   Medications: Current Outpatient Prescriptions  Medication Sig Dispense Refill  . albuterol (ACCUNEB) 1.25 MG/3ML nebulizer solution 1.25 mg every 6 (six) hours as needed for wheezing or shortness of breath.     Marland Kitchen aspirin 81 MG tablet Take 81 mg by mouth daily.    . budesonide-formoterol (SYMBICORT) 80-4.5 MCG/ACT inhaler Take 2 puffs first thing in am and then another 2 puffs about 12 hours later.    . diclofenac sodium (VOLTAREN) 1 % GEL Apply 2 g topically 4 (four) times daily. To affected joint. 100 g 11  .  docusate sodium (COLACE) 100 MG capsule Take 1 capsule (100 mg total) by mouth 2 (two) times daily. (Patient taking differently: Take 100 mg by mouth daily as needed. ) 10 capsule 0  . HYDROcodone-acetaminophen (NORCO/VICODIN) 5-325 MG tablet Take 1 tablet by mouth every 6 (six) hours as needed for severe pain. Take one by mouth at bedtime as needed for pain 15 tablet 0  . ipratropium (ATROVENT) 0.02 % nebulizer solution Take 2.5 mLs (0.5 mg total) by nebulization every 6 (six) hours as needed for wheezing or shortness of breath. 75 mL 12  . potassium chloride (K-DUR) 10 MEQ tablet TAKE 1 TABLET(10 MEQ) BY MOUTH DAILY 30 tablet 5  . sertraline (ZOLOFT) 100 MG tablet Take 1 tablet (100 mg total) by mouth daily. 90 tablet 3   No current facility-administered medications for this visit.    Allergies  Allergen Reactions  . Rosuvastatin Calcium     Other reaction(s): Other Muscle and joint pain  . Atorvastatin     Muscle and joint pain  . Codeine Nausea Only and Other (See Comments)    Stomach pains  . Crestor [Rosuvastatin]     Muscle and joint pain  . Prozac [Fluoxetine Hcl] Other (See Comments)  Changes her personality  . Latex Rash  . Sulfa Antibiotics Rash     Exam:  BP (!) 151/48   Pulse 65   Wt 196 lb (88.9 kg)   BMI 37.03 kg/m  General: Well Developed, well nourished, and in no acute distress.  Neuro/Psych: Alert and oriented x3, extra-ocular muscles intact, able to move all 4 extremities, sensation grossly intact. Skin: Warm and dry, no rashes noted.  Respiratory: Not using accessory muscles, speaking in full sentences, trachea midline.  Cardiovascular: Pulses palpable, no extremity edema. Abdomen: Does not appear distended. MSK: Left wrist slightly swollen. Tender palpation at the ulnar wrist. Hand stiffness is present especially into the fourth fifth digits    No results found for this or any previous visit (from the past 48 hour(s)). Dg Wrist Complete  Left  Result Date: 10/17/2016 CLINICAL DATA:  Left wrist pain after injury 2 weeks ago. EXAM: LEFT WRIST - COMPLETE 3+ VIEW COMPARISON:  None. FINDINGS: There is no evidence of fracture or dislocation. Narrowing and osteophyte formation of first carpometacarpal joint is noted. Soft tissues are unremarkable. IMPRESSION: Osteoarthritis of first carpometacarpal joint. No acute abnormality seen in the left wrist. Electronically Signed   By: Marijo Conception, M.D.   On: 10/17/2016 10:48      Assessment and Plan: 66 y.o. female with wrist and hand pain. Exacerbation of DJD likely. Potential ligament injury or TFCC injury. Plan to transition out of splint as guided by comfort. Additionally we'll refer to hand physical therapy and recheck in a few weeks. If not improving consider MRI arthrogram.    Orders Placed This Encounter  Procedures  . DG Wrist Complete Left    Standing Status:   Future    Number of Occurrences:   1    Standing Expiration Date:   12/17/2017    Order Specific Question:   Reason for Exam (SYMPTOM  OR DIAGNOSIS REQUIRED)    Answer:   eval pain lt wrist. ?TFCC    Order Specific Question:   Preferred imaging location?    Answer:   [facility]  . Ambulatory referral to Physical Therapy    Referral Priority:   Routine    Referral Type:   Physical Medicine    Referral Reason:   Specialty Services Required    Requested Specialty:   Physical Therapy    Number of Visits Requested:   1    Discussed warning signs or symptoms. Please see discharge instructions. Patient expresses understanding.

## 2016-10-17 NOTE — Patient Instructions (Signed)
Thank you for coming in today. Apply the voltaren gel to you hand up to 4x daily for pain as needed.  If your insurance does not want to pay for the gel it sill be $26 at [facility] with the coupon I printed.  Attend Hand PT in [place]  [facility] [place], [place])  [id number]  [phone]  Recheck in 1 month or sooner if needed.     Use the splint as needed for pain and stability.

## 2016-10-18 ENCOUNTER — Emergency Department
Admission: EM | Admit: 2016-10-18 | Discharge: 2016-10-18 | Disposition: A | Discharge status: Home / Self Care | Payer: Medicare Other | Source: Home / Self Care | Attending: Family Medicine | Admitting: Family Medicine

## 2016-10-18 ENCOUNTER — Encounter: Payer: Self-pay | Admitting: Emergency Medicine

## 2016-10-18 ENCOUNTER — Emergency Department (INDEPENDENT_AMBULATORY_CARE_PROVIDER_SITE_OTHER): Payer: Medicare Other

## 2016-10-18 DIAGNOSIS — S53401A Unspecified sprain of right elbow, initial encounter: Secondary | ICD-10-CM | POA: Diagnosis not present

## 2016-10-18 DIAGNOSIS — M25521 Pain in right elbow: Secondary | ICD-10-CM | POA: Diagnosis not present

## 2016-10-18 DIAGNOSIS — S59901A Unspecified injury of right elbow, initial encounter: Secondary | ICD-10-CM | POA: Diagnosis not present

## 2016-10-18 MED ORDER — HYDROCODONE-ACETAMINOPHEN 5-325 MG PO TABS: 1.0000 | ORAL_TABLET | Freq: Four times a day (QID) | ORAL | 0 refills | Status: DC | PRN

## 2016-10-18 NOTE — ED Triage Notes (Signed)
Patient hyperextended right elbow yesterday while trying to get a jacket off; now painful with some edema. Took flexeril this morning.

## 2016-10-18 NOTE — Discharge Instructions (Signed)
Wear sling.  Apply ice pack for 20 to 30 minutes, 3 to 4 times daily  Continue until pain and swelling decrease.  May continue Ibuprofen 200mg , 4 tabs every 8 hours with food.   For calcium supplement, may take Citracal (calcium citrate plus vitamin D3 and magnesium).

## 2016-10-18 NOTE — ED Provider Notes (Signed)
[name]    CSN: [id number] Arrival date & time: 10/18/16  1524     History   Chief Complaint Chief Complaint  Patient presents with  . Elbow Pain    right    HPI Shelley Perez is a 66 y.o. female.   While trying to remove her jacket yesterday, patient hyperextended her right elbow, with subsequent pain/swelling that has persisted. She requests info about appropriate dose of calcium and vitamin D.   The history is provided by the patient.  Arm Injury  Location:  Elbow Elbow location:  R elbow Injury: yes   Time since incident:  1 day Mechanism of injury comment:  Hyperextended Pain details:    Quality:  Aching   Radiates to:  Does not radiate   Severity:  Moderate   Onset quality:  Sudden   Duration:  1 day   Timing:  Constant   Progression:  Unchanged Handedness:  Right-handed Prior injury to area:  No Relieved by:  Nothing Worsened by:  Movement Ineffective treatments:  Muscle relaxant Associated symptoms: decreased range of motion, stiffness and swelling   Associated symptoms: no muscle weakness, no numbness and no tingling     Past Medical History:  Diagnosis Date  . Anemia   . Anxiety   . Arthritis   . Asthma   . Complication of anesthesia    "they gave me too much and I had to stay 3 days " 2005 after choley   . COPD (chronic obstructive pulmonary disease) ([place])   . Depression   . Fibromyalgia   . History of kidney stones   . Hx of pancreatitis    with gallstones  . Hyperlipidemia   . Insomnia   . Osteoporosis     Patient Active Problem List   Diagnosis Date Noted  . Left wrist pain 10/17/2016  . Neuralgia of left lower extremity 06/05/2016  . Arthralgia of left lower leg 06/05/2016  . Pain of joint of left ankle and foot 06/05/2016  . Gastroesophageal reflux disease with esophagitis 01/29/2016  . Osteoporosis 01/29/2016  . Vitamin D deficiency 01/29/2016  . Menopause ovarian failure 01/29/2016  . Upper airway cough  syndrome 12/12/2015  . Abnormal CT of the chest 11/22/2015  . COPD GOLD 0  11/21/2015  . Obese 07/11/2015    Past Surgical History:  Procedure Laterality Date  . c sections    . CHOLECYSTECTOMY    . DENTAL SURGERY    . FOOT SURGERY    . KNEE SURGERY  1978 / 1971  . SHOULDER SURGERY    . TOTAL KNEE ARTHROPLASTY Right 07/10/2015   Procedure: RIGHT TOTAL KNEE ARTHROPLASTY;  Surgeon: Paralee Cancel, MD;  Location: [facility];  Service: Orthopedics;  Laterality: Right;    OB History    No data available       Home Medications    Prior to Admission medications   Medication Sig Start Date End Date Taking? Authorizing Provider  albuterol (ACCUNEB) 1.25 MG/3ML nebulizer solution 1.25 mg every 6 (six) hours as needed for wheezing or shortness of breath.  10/26/14 06/05/16  Historical Provider, MD  aspirin 81 MG tablet Take 81 mg by mouth daily.    Historical Provider, MD  budesonide-formoterol (SYMBICORT) 80-4.5 MCG/ACT inhaler Take 2 puffs first thing in am and then another 2 puffs about 12 hours later. 12/11/15   Tanda Rockers, MD  diclofenac sodium (VOLTAREN) 1 % GEL Apply 2 g topically 4 (four) times daily. To affected  joint. 10/17/16   Gregor Hams, MD  docusate sodium (COLACE) 100 MG capsule Take 1 capsule (100 mg total) by mouth 2 (two) times daily. Patient taking differently: Take 100 mg by mouth daily as needed.  07/11/15   Danae Orleans, PA-C  HYDROcodone-acetaminophen (NORCO/VICODIN) 5-325 MG tablet Take 1 tablet by mouth every 6 (six) hours as needed for severe pain. 10/18/16   Kandra Nicolas, MD  ipratropium (ATROVENT) 0.02 % nebulizer solution Take 2.5 mLs (0.5 mg total) by nebulization every 6 (six) hours as needed for wheezing or shortness of breath. 11/11/15   Tereasa Coop, PA-C  potassium chloride (K-DUR) 10 MEQ tablet TAKE 1 TABLET(10 MEQ) BY MOUTH DAILY 03/28/16   Janith Lima, MD  sertraline (ZOLOFT) 100 MG tablet Take 1 tablet (100 mg total) by mouth daily. 02/18/16   Janith Lima, MD    Family History Family History  Problem Relation Age of Onset  . Hyperlipidemia Mother   . Hypertension Mother   . Heart attack Mother   . Cancer Father   . Asthma Sister   . Lung cancer Sister     smoked    Social History Social History  Substance Use Topics  . Smoking status: Former Smoker    Packs/day: 1.00    Years: 40.00    Types: Cigarettes    Quit date: 06/28/2013  . Smokeless tobacco: Never Used  . Alcohol use No     Allergies   Rosuvastatin calcium; Atorvastatin; Codeine; Crestor [rosuvastatin]; Prozac [fluoxetine hcl]; Latex; and Sulfa antibiotics   Review of Systems Review of Systems  Musculoskeletal: Positive for stiffness.  All other systems reviewed and are negative.    Physical Exam Triage Vital Signs ED Triage Vitals  Enc Vitals Group     BP 10/18/16 1622 170/92     Pulse Rate 10/18/16 1622 75     Resp 10/18/16 1622 18     Temp 10/18/16 1622 98.3 F (36.8 C)     Temp Source 10/18/16 1622 Oral     SpO2 10/18/16 1622 95 %     Weight --      Height --      Head Circumference --      Peak Flow --      Pain Score 10/18/16 1625 9     Pain Loc --      Pain Edu? --      Excl. in [place]? --    No data found.   Updated Vital Signs BP 170/92 (BP Location: Left Arm)   Pulse 75   Temp 98.3 F (36.8 C) (Oral)   Resp 18   SpO2 95%   Visual Acuity Right Eye Distance:   Left Eye Distance:   Bilateral Distance:    Right Eye Near:   Left Eye Near:    Bilateral Near:     Physical Exam  Constitutional: She appears well-developed and well-nourished. No distress.  HENT:  Head: Normocephalic.  Eyes: Conjunctivae are normal. Pupils are equal, round, and reactive to light.  Neck: Normal range of motion.  Cardiovascular: Normal rate.   Pulmonary/Chest: Effort normal.  Musculoskeletal:       Right elbow: She exhibits decreased range of motion and swelling. She exhibits no deformity and no laceration. Tenderness found. Medial  epicondyle, lateral epicondyle and olecranon process tenderness noted.       Arms: Patient has difficulty actively and passively fully extending and flexing her right elbow.  There is mild swelling  and diffuse tenderness.  No tenderness over radial head.  Distal neurovascular function is intact.   Neurological: She is alert.  Skin: Skin is warm and dry.  Nursing note and vitals reviewed.    UC Treatments / Results  Labs (all labs ordered are listed, but only abnormal results are displayed) Labs Reviewed - No data to display  EKG  EKG Interpretation None       Radiology Dg Elbow Complete Right  Result Date: 10/18/2016 CLINICAL DATA:  Injured elbow yesterday. Posterior elbow pain and swelling. EXAM: RIGHT ELBOW - COMPLETE 3+ VIEW COMPARISON:  None. FINDINGS: The joint spaces are maintained. No acute bony findings. Tiny spur noted at the olecranon process. No definite joint effusion. IMPRESSION: No acute bony findings, osteochondral lesion or joint effusion. Electronically Signed   By: Marijo Sanes M.D.   On: 10/18/2016 17:17   Dg Wrist Complete Left  Result Date: 10/17/2016 CLINICAL DATA:  Left wrist pain after injury 2 weeks ago. EXAM: LEFT WRIST - COMPLETE 3+ VIEW COMPARISON:  None. FINDINGS: There is no evidence of fracture or dislocation. Narrowing and osteophyte formation of first carpometacarpal joint is noted. Soft tissues are unremarkable. IMPRESSION: Osteoarthritis of first carpometacarpal joint. No acute abnormality seen in the left wrist. Electronically Signed   By: Marijo Conception, M.D.   On: 10/17/2016 10:48    Procedures Procedures (including critical care time)  Medications Ordered in UC Medications - No data to display   Initial Impression / Assessment and Plan / UC Course  I have reviewed the triage vital signs and the nursing notes.  Pertinent labs & imaging results that were available during my care of the patient were reviewed by me and considered in my  medical decision making (see chart for details).    Dispensed sling immobilizer. Rx for Lortab (#10, no refill) Wear sling.  Apply ice pack for 20 to 30 minutes, 3 to 4 times daily  Continue until pain and swelling decrease.  May continue Ibuprofen 200mg , 4 tabs every 8 hours with food.   For calcium supplement, may take Citracal (calcium citrate plus vitamin D3 and magnesium). Followup with Dr. Lynne Leader ([facility]) if not improving about ten days.     Final Clinical Impressions(s) / UC Diagnoses   Final diagnoses:  Sprain of right elbow, initial encounter    New Prescriptions Current Discharge Medication List       Kandra Nicolas, MD 10/21/16 1441

## 2016-10-21 DIAGNOSIS — M6281 Muscle weakness (generalized): Secondary | ICD-10-CM | POA: Diagnosis not present

## 2016-10-21 DIAGNOSIS — M659 Synovitis and tenosynovitis, unspecified: Secondary | ICD-10-CM | POA: Diagnosis not present

## 2016-10-21 DIAGNOSIS — M25532 Pain in left wrist: Secondary | ICD-10-CM | POA: Diagnosis not present

## 2016-10-21 DIAGNOSIS — M25632 Stiffness of left wrist, not elsewhere classified: Secondary | ICD-10-CM | POA: Diagnosis not present

## 2016-10-23 DIAGNOSIS — M25532 Pain in left wrist: Secondary | ICD-10-CM | POA: Diagnosis not present

## 2016-10-23 DIAGNOSIS — M6281 Muscle weakness (generalized): Secondary | ICD-10-CM | POA: Diagnosis not present

## 2016-10-23 DIAGNOSIS — M25632 Stiffness of left wrist, not elsewhere classified: Secondary | ICD-10-CM | POA: Diagnosis not present

## 2016-10-23 DIAGNOSIS — M659 Synovitis and tenosynovitis, unspecified: Secondary | ICD-10-CM | POA: Diagnosis not present

## 2016-11-04 DIAGNOSIS — M6281 Muscle weakness (generalized): Secondary | ICD-10-CM | POA: Diagnosis not present

## 2016-11-04 DIAGNOSIS — M25532 Pain in left wrist: Secondary | ICD-10-CM | POA: Diagnosis not present

## 2016-11-04 DIAGNOSIS — M25632 Stiffness of left wrist, not elsewhere classified: Secondary | ICD-10-CM | POA: Diagnosis not present

## 2016-11-04 DIAGNOSIS — M659 Synovitis and tenosynovitis, unspecified: Secondary | ICD-10-CM | POA: Diagnosis not present

## 2016-11-06 ENCOUNTER — Ambulatory Visit (INDEPENDENT_AMBULATORY_CARE_PROVIDER_SITE_OTHER): Payer: Medicare Other | Admitting: Family Medicine

## 2016-11-06 VITALS — BP 138/55 | HR 64 | Wt 193.0 lb

## 2016-11-06 DIAGNOSIS — M25532 Pain in left wrist: Secondary | ICD-10-CM | POA: Diagnosis not present

## 2016-11-06 NOTE — Patient Instructions (Signed)
Thank you for coming in today. Continue home exercises.  Return as needed.

## 2016-11-06 NOTE — Progress Notes (Signed)
Shelley Perez is a 66 y.o. female who presents to [facility] today for follow-up left wrist pain. Patient returns to clinic to follow-up her left ulnar wrist pain. She has been attending physical therapy and notes that she is much better. She is approximately 90% better would like to discontinue physical therapy. She wants to continue home exercises. She thinks she can resume her normal activities.   Past Medical History:  Diagnosis Date  . Anemia   . Anxiety   . Arthritis   . Asthma   . Complication of anesthesia    "they gave me too much and I had to stay 3 days " 2005 after choley   . COPD (chronic obstructive pulmonary disease) ([place])   . Depression   . Fibromyalgia   . History of kidney stones   . Hx of pancreatitis    with gallstones  . Hyperlipidemia   . Insomnia   . Osteoporosis    Past Surgical History:  Procedure Laterality Date  . c sections    . CHOLECYSTECTOMY    . DENTAL SURGERY    . FOOT SURGERY    . KNEE SURGERY  1978 / 1971  . SHOULDER SURGERY    . TOTAL KNEE ARTHROPLASTY Right 07/10/2015   Procedure: RIGHT TOTAL KNEE ARTHROPLASTY;  Surgeon: Paralee Cancel, MD;  Location: [facility];  Service: Orthopedics;  Laterality: Right;   Social History  Substance Use Topics  . Smoking status: Former Smoker    Packs/day: 1.00    Years: 40.00    Types: Cigarettes    Quit date: 06/28/2013  . Smokeless tobacco: Never Used  . Alcohol use No     ROS:  As above   Medications: Current Outpatient Prescriptions  Medication Sig Dispense Refill  . albuterol (ACCUNEB) 1.25 MG/3ML nebulizer solution 1.25 mg every 6 (six) hours as needed for wheezing or shortness of breath.     Marland Kitchen aspirin 81 MG tablet Take 81 mg by mouth daily.    . budesonide-formoterol (SYMBICORT) 80-4.5 MCG/ACT inhaler Take 2 puffs first thing in am and then another 2 puffs about 12 hours later.    . diclofenac sodium (VOLTAREN) 1 % GEL Apply 2 g topically 4  (four) times daily. To affected joint. 100 g 11  . docusate sodium (COLACE) 100 MG capsule Take 1 capsule (100 mg total) by mouth 2 (two) times daily. (Patient taking differently: Take 100 mg by mouth daily as needed. ) 10 capsule 0  . HYDROcodone-acetaminophen (NORCO/VICODIN) 5-325 MG tablet Take 1 tablet by mouth every 6 (six) hours as needed for severe pain. 10 tablet 0  . ipratropium (ATROVENT) 0.02 % nebulizer solution Take 2.5 mLs (0.5 mg total) by nebulization every 6 (six) hours as needed for wheezing or shortness of breath. 75 mL 12  . potassium chloride (K-DUR) 10 MEQ tablet TAKE 1 TABLET(10 MEQ) BY MOUTH DAILY 30 tablet 5  . sertraline (ZOLOFT) 100 MG tablet Take 1 tablet (100 mg total) by mouth daily. 90 tablet 3   No current facility-administered medications for this visit.    Allergies  Allergen Reactions  . Rosuvastatin Calcium     Other reaction(s): Other Muscle and joint pain  . Atorvastatin     Muscle and joint pain  . Codeine Nausea Only and Other (See Comments)    Stomach pains  . Crestor [Rosuvastatin]     Muscle and joint pain  . Prozac [Fluoxetine Hcl] Other (See Comments)  Changes her personality  . Latex Rash  . Sulfa Antibiotics Rash     Exam:  BP (!) 138/55   Pulse 64   Wt 193 lb (87.5 kg)   BMI 36.47 kg/m  General: Well Developed, well nourished, and in no acute distress.  Neuro/Psych: Alert and oriented x3, extra-ocular muscles intact, able to move all 4 extremities, sensation grossly intact. Skin: Warm and dry, no rashes noted.  Respiratory: Not using accessory muscles, speaking in full sentences, trachea midline.  Cardiovascular: Pulses palpable, no extremity edema. Abdomen: Does not appear distended. MSK: Left wrist: Slightly swollen ulnar wrist nontender normal motion.    No results found for this or any previous visit (from the past 48 hour(s)). No results found.    Assessment and Plan: 66 y.o. female with resolving wrist pain.  Likely exacerbation of underlying DJD. Needed. Continue home exercises.    No orders of the defined types were placed in this encounter.   Discussed warning signs or symptoms. Please see discharge instructions. Patient expresses understanding.  CC: Scarlette Calico, MD

## 2016-11-13 ENCOUNTER — Ambulatory Visit: Payer: Medicare Other | Admitting: Family Medicine

## 2016-11-24 ENCOUNTER — Other Ambulatory Visit: Payer: Self-pay | Admitting: *Deleted

## 2016-11-24 DIAGNOSIS — J40 Bronchitis, not specified as acute or chronic: Secondary | ICD-10-CM | POA: Diagnosis not present

## 2016-11-24 MED ORDER — FUROSEMIDE 20 MG PO TABS: 20.0000 mg | ORAL_TABLET | Freq: Two times a day (BID) | ORAL | 2 refills | Status: DC

## 2016-12-08 ENCOUNTER — Telehealth: Payer: Self-pay | Admitting: Internal Medicine

## 2016-12-08 NOTE — Telephone Encounter (Signed)
Noted     Closing encounter

## 2016-12-08 NOTE — Telephone Encounter (Addendum)
Pt has an appt 5/4 for Medicare Wellness, she will be bringing in a [place] note to be excused from [employer], it is in [facility] on June 4th, she states all of her doctors have signed her out before, she has severe depression, and does not like crowds. She will be bringing in those forms to her appt on 5/4

## 2016-12-18 NOTE — Progress Notes (Signed)
Subjective:   Shelley Perez is a 66 y.o. female who presents for an Initial Medicare Annual Wellness Visit.  Review of Systems    No ROS.  Medicare Wellness Visit. Cardiac Risk Factors include: advanced age (>26men, >70 women);dyslipidemia;obesity (BMI >30kg/m2);sedentary lifestyle Sleep patterns: feels rested on waking, gets up 1-2 times nightly to void and sleeps 7-8 hours nightly.   Home Safety/Smoke Alarms: Feels safe in home. Smoke alarms in place.   Living environment; residence and Firearm Safety: 1-story house/ trailer, no firearms. Lives with sister, no DME needed at this time Seat Belt Safety/Bike Helmet: Wears seat belt.   Counseling:   Eye Exam- appointment yearly Dental- 2-3 years, resources provided  Female:   Pap- N/A      Mammo-  Last 12/27/01, BI-RAD category 1: negative, per patient mammo done last 2016 in [place],  and states she will call to make an appointment    Dexa scan-  Last 02/27/16, osteopenia       CCS- Last cologuard, negative     Objective:    Today's Vitals   12/19/16 1327  BP: 115/80  Pulse: 60  Resp: 20  SpO2: 99%  Weight: 197 lb (89.4 kg)  Height: 5\' 1"  (1.549 m)   Body mass index is 37.22 kg/m.   Current Medications (verified) Outpatient Encounter Prescriptions as of 12/19/2016  Medication Sig  . aspirin 81 MG tablet Take 81 mg by mouth daily.  . budesonide-formoterol (SYMBICORT) 80-4.5 MCG/ACT inhaler Take 2 puffs first thing in am and then another 2 puffs about 12 hours later.  . cholecalciferol (VITAMIN D) 400 units TABS tablet Take 400 Units by mouth.  . diclofenac sodium (VOLTAREN) 1 % GEL Apply 2 g topically 4 (four) times daily. To affected joint.  Marland Kitchen docusate sodium (COLACE) 100 MG capsule Take 1 capsule (100 mg total) by mouth 2 (two) times daily. (Patient taking differently: Take 100 mg by mouth daily as needed. )  . furosemide (LASIX) 20 MG tablet Take 1 tablet (20 mg total) by mouth 2 (two) times daily.  Marland Kitchen omega-3  acid ethyl esters (LOVAZA) 1 g capsule Take by mouth 2 (two) times daily.  . potassium chloride (K-DUR) 10 MEQ tablet TAKE 1 TABLET(10 MEQ) BY MOUTH DAILY  . sertraline (ZOLOFT) 100 MG tablet Take 1 tablet (100 mg total) by mouth daily.  . [DISCONTINUED] albuterol (ACCUNEB) 1.25 MG/3ML nebulizer solution 1.25 mg every 6 (six) hours as needed for wheezing or shortness of breath.   . [DISCONTINUED] HYDROcodone-acetaminophen (NORCO/VICODIN) 5-325 MG tablet Take 1 tablet by mouth every 6 (six) hours as needed for severe pain. (Patient not taking: Reported on 12/19/2016)  . [DISCONTINUED] ipratropium (ATROVENT) 0.02 % nebulizer solution Take 2.5 mLs (0.5 mg total) by nebulization every 6 (six) hours as needed for wheezing or shortness of breath. (Patient not taking: Reported on 12/19/2016)   No facility-administered encounter medications on file as of 12/19/2016.     Allergies (verified) Rosuvastatin calcium; Atorvastatin; Codeine; Crestor [rosuvastatin]; Prozac [fluoxetine hcl]; Latex; and Sulfa antibiotics   History: Past Medical History:  Diagnosis Date  . Anemia   . Anxiety   . Arthritis   . Asthma   . Complication of anesthesia    "they gave me too much and I had to stay 3 days " 2005 after choley   . COPD (chronic obstructive pulmonary disease) ([place])   . Depression   . Fibromyalgia   . History of kidney stones   . Hx of pancreatitis  with gallstones  . Hyperlipidemia   . Insomnia   . Osteoporosis    Past Surgical History:  Procedure Laterality Date  . c sections    . CHOLECYSTECTOMY    . DENTAL SURGERY    . FOOT SURGERY    . KNEE SURGERY  1978 / 1971  . SHOULDER SURGERY    . TOTAL KNEE ARTHROPLASTY Right 07/10/2015   Procedure: RIGHT TOTAL KNEE ARTHROPLASTY;  Surgeon: Paralee Cancel, MD;  Location: [facility];  Service: Orthopedics;  Laterality: Right;   Family History  Problem Relation Age of Onset  . Hyperlipidemia Mother   . Hypertension Mother   . Heart attack Mother   .  Cancer Father   . Asthma Sister   . Lung cancer Sister     smoked   Social History   Occupational History  . Not on file.   Social History Main Topics  . Smoking status: Former Smoker    Packs/day: 1.00    Years: 40.00    Types: Cigarettes    Quit date: 06/28/2013  . Smokeless tobacco: Never Used  . Alcohol use No  . Drug use: No  . Sexual activity: No    Tobacco Counseling Counseling given: Not Answered   Activities of Daily Living In your present state of health, do you have any difficulty performing the following activities: 12/19/2016  Hearing? N  Vision? N  Difficulty concentrating or making decisions? N  Walking or climbing stairs? N  Dressing or bathing? N  Doing errands, shopping? N  Preparing Food and eating ? N  Using the Toilet? N  In the past six months, have you accidently leaked urine? N  Do you have problems with loss of bowel control? N  Managing your Medications? N  Managing your Finances? N  Housekeeping or managing your Housekeeping? N  Some recent data might be hidden    Immunizations and Health Maintenance Immunization History  Administered Date(s) Administered  . Influenza, High Dose Seasonal PF 06/05/2016  . Pneumococcal Conjugate-13 12/19/2016  . Pneumococcal Polysaccharide-23 05/14/2015  . Tdap 06/05/2016   Health Maintenance Due  Topic Date Due  . Hepatitis C Screening  10/14/1950  . PNA vac Low Risk Adult (1 of 2 - PCV13) 05/13/2016    Patient Care Team: Janith Lima, MD as PCP - General (Internal Medicine)  Indicate any recent Medical Services you may have received from other than [facility] providers in the past year (date may be approximate).     Assessment:   This is a routine wellness examination for [place]. Physical assessment deferred to PCP.   Hearing/Vision screen Hearing Screening Comments: Reports HOH, declines audiology referral Vision Screening Comments: Wears glasses  Dietary issues and exercise activities  discussed: Current Exercise Habits: The patient does not participate in regular exercise at present, Exercise limited by: orthopedic condition(s) Diet (meal preparation, eat out, water intake, caffeinated beverages, dairy products, fruits and vegetables): in general, a "healthy" diet  , well balanced, low salt   Discussed low fat/cholesterol, low salt diet, increasing water intake, Diet education was attached to patient's AVS.    Goals    . Increase physical activity          Stay as active as possible, perhaps exercise at home.       Depression Screen PHQ 2/9 Scores 12/19/2016 12/19/2016 11/11/2015 11/10/2015  PHQ - 2 Score 2 1 0 0  PHQ- 9 Score 3 - - -    Fall Risk Fall Risk  12/19/2016  Falls in the past year? No    Cognitive Function:       Ad8 score reviewed for issues:  Issues making decisions: no  Less interest in hobbies / activities: no  Repeats questions, stories (family complaining): no  Trouble using ordinary gadgets (microwave, computer, phone): no  Forgets the month or year: no  Mismanaging finances: no  Remembering appts: no  Daily problems with thinking and/or memory: no Ad8 score is= 0     Screening Tests Health Maintenance  Topic Date Due  . Hepatitis C Screening  Mar 22, 1951  . PNA vac Low Risk Adult (1 of 2 - PCV13) 05/13/2016  . INFLUENZA VACCINE  03/18/2017  . MAMMOGRAM  05/03/2017  . Fecal DNA (Cologuard)  03/21/2019  . TETANUS/TDAP  06/05/2026  . DEXA SCAN  Completed      Plan:    Start to eat heart healthy diet (full of fruits, vegetables, whole grains, lean protein, water--limit salt, fat, and sugar intake) and increase physical activity as tolerated.  Continue doing brain stimulating activities (puzzles, reading, adult coloring books, staying active) to keep memory sharp.   I have personally reviewed and noted the following in the patient's chart:   . Medical and social history . Use of alcohol, tobacco or illicit drugs   . Current medications and supplements . Functional ability and status . Nutritional status . Physical activity . Advanced directives . List of other physicians . Hospitalizations, surgeries, and ER visits in previous 12 months . Vitals . Screenings to include cognitive, depression, and falls . Referrals and appointments  In addition, I have reviewed and discussed with patient certain preventive protocols, quality metrics, and best practice recommendations. A written personalized care plan for preventive services as well as general preventive health recommendations were provided to patient.     Michiel Cowboy, RN   12/19/2016

## 2016-12-18 NOTE — Progress Notes (Signed)
Pre visit review using our clinic review tool, if applicable. No additional management support is needed unless otherwise documented below in the visit note. 

## 2016-12-19 ENCOUNTER — Ambulatory Visit (INDEPENDENT_AMBULATORY_CARE_PROVIDER_SITE_OTHER): Payer: Medicare Other | Admitting: *Deleted

## 2016-12-19 VITALS — BP 115/80 | HR 60 | Resp 20 | Ht 61.0 in | Wt 197.0 lb

## 2016-12-19 DIAGNOSIS — Z0000 Encounter for general adult medical examination without abnormal findings: Secondary | ICD-10-CM | POA: Diagnosis not present

## 2016-12-19 DIAGNOSIS — Z23 Encounter for immunization: Secondary | ICD-10-CM

## 2016-12-19 NOTE — Patient Instructions (Addendum)
Start to eat heart healthy diet (full of fruits, vegetables, whole grains, lean protein, water--limit salt, fat, and sugar intake) and increase physical activity as tolerated.  Continue doing brain stimulating activities (puzzles, reading, adult coloring books, staying active) to keep memory sharp.    Shelley Perez , Thank you for taking time to come for your Medicare Wellness Visit. I appreciate your ongoing commitment to your health goals. Please review the following plan we discussed and let me know if I can assist you in the future.   These are the goals we discussed: Goals    . Increase physical activity          Stay as active as possible, perhaps exercise at home.        This is a list of the screening recommended for you and due dates:  Health Maintenance  Topic Date Due  .  Hepatitis C: One time screening is recommended by Center for Disease Control  (CDC) for  adults born from [birth year] through 1965.   October 06, 1950  . Pneumonia vaccines (1 of 2 - PCV13) 05/13/2016  . Flu Shot  03/18/2017  . Mammogram  05/03/2017  . Cologuard (Stool DNA test)  03/21/2019  . Tetanus Vaccine  06/05/2026  . DEXA scan (bone density measurement)  Completed        Fat and Cholesterol Restricted Diet Getting too much fat and cholesterol in your diet may cause health problems. Following this diet helps keep your fat and cholesterol at normal levels. This can keep you from getting sick. What types of fat should I choose?  Choose monosaturated and polyunsaturated fats. These are found in foods such as olive oil, canola oil, flaxseeds, walnuts, almonds, and seeds.  Eat more omega-3 fats. Good choices include salmon, mackerel, sardines, tuna, flaxseed oil, and ground flaxseeds.  Limit saturated fats. These are in animal products such as meats, butter, and cream. They can also be in plant products such as palm oil, palm kernel oil, and coconut oil.  Avoid foods with partially hydrogenated oils in them.  These contain trans fats. Examples of foods that have trans fats are stick margarine, some tub margarines, cookies, crackers, and other baked goods. What general guidelines do I need to follow?  Check food labels. Look for the words "trans fat" and "saturated fat."  When preparing a meal:  Fill half of your plate with vegetables and green salads.  Fill one fourth of your plate with whole grains. Look for the word "whole" as the first word in the ingredient list.  Fill one fourth of your plate with lean protein foods.  Eat more foods that have fiber, like apples, carrots, beans, peas, and barley.  Eat more home-cooked foods. Eat less at restaurants and buffets.  Limit or avoid alcohol.  Limit foods high in starch and sugar.  Limit fried foods.  Cook foods without frying them. Baking, boiling, grilling, and broiling are all great options.  Lose weight if you are overweight. Losing even a small amount of weight can help your overall health. It can also help prevent diseases such as diabetes and heart disease. What foods can I eat? Grains  Whole grains, such as whole wheat or whole grain breads, crackers, cereals, and pasta. Unsweetened oatmeal, bulgur, barley, quinoa, or brown rice. Corn or whole wheat flour tortillas. Vegetables  Fresh or frozen vegetables (raw, steamed, roasted, or grilled). Green salads. Fruits  All fresh, canned (in natural juice), or frozen fruits. Meat and Other Protein Products  Ground beef (85% or leaner), grass-fed beef, or beef trimmed of fat. Skinless chicken or [place]. Ground chicken or [place]. Pork trimmed of fat. All fish and seafood. Eggs. Dried beans, peas, or lentils. Unsalted nuts or seeds. Unsalted canned or dry beans. Dairy  Low-fat dairy products, such as skim or 1% milk, 2% or reduced-fat cheeses, low-fat ricotta or cottage cheese, or plain low-fat yogurt. Fats and Oils  Tub margarines without trans fats. Light or reduced-fat mayonnaise and  salad dressings. Avocado. Olive, canola, sesame, or safflower oils. Natural peanut or almond butter (choose ones without added sugar and oil). The items listed above may not be a complete list of recommended foods or beverages. Contact your dietitian for more options.  What foods are not recommended? Grains  White bread. White pasta. White rice. Cornbread. Bagels, pastries, and croissants. Crackers that contain trans fat. Vegetables  White potatoes. Corn. Creamed or fried vegetables. Vegetables in a cheese sauce. Fruits  Dried fruits. Canned fruit in light or heavy syrup. Fruit juice. Meat and Other Protein Products  Fatty cuts of meat. Ribs, chicken wings, bacon, sausage, bologna, salami, chitterlings, fatback, hot dogs, bratwurst, and packaged luncheon meats. Liver and organ meats. Dairy  Whole or 2% milk, cream, half-and-half, and cream cheese. Whole milk cheeses. Whole-fat or sweetened yogurt. Full-fat cheeses. Nondairy creamers and whipped toppings. Processed cheese, cheese spreads, or cheese curds. Sweets and Desserts  Corn syrup, sugars, honey, and molasses. Candy. Jam and jelly. Syrup. Sweetened cereals. Cookies, pies, cakes, donuts, muffins, and ice cream. Fats and Oils  Butter, stick margarine, lard, shortening, ghee, or bacon fat. Coconut, palm kernel, or palm oils. Beverages  Alcohol. Sweetened drinks (such as sodas, lemonade, and fruit drinks or punches). The items listed above may not be a complete list of foods and beverages to avoid. Contact your dietitian for more information.  This information is not intended to replace advice given to you by your health care provider. Make sure you discuss any questions you have with your health care provider. Document Released: 02/03/2012 Document Revised: 04/10/2016 Document Reviewed: 11/03/2013 Elsevier Interactive Patient Education  2017 [place] DASH stands for "Dietary Approaches to Stop Hypertension." The  DASH eating plan is a healthy eating plan that has been shown to reduce high blood pressure (hypertension). It may also reduce your risk for type 2 diabetes, heart disease, and stroke. The DASH eating plan may also help with weight loss. What are tips for following this plan? General guidelines   Avoid eating more than 2,300 mg (milligrams) of salt (sodium) a day. If you have hypertension, you may need to reduce your sodium intake to 1,500 mg a day.  Limit alcohol intake to no more than 1 drink a day for nonpregnant women and 2 drinks a day for men. One drink equals 12 oz of beer, 5 oz of Shelley Perez, or 1 oz of hard liquor.  Work with your health care provider to maintain a healthy body weight or to lose weight. Ask what an ideal weight is for you.  Get at least 30 minutes of exercise that causes your heart to beat faster (aerobic exercise) most days of the week. Activities may include walking, swimming, or biking.  Work with your health care provider or diet and nutrition specialist (dietitian) to adjust your eating plan to your individual calorie needs. Reading food labels   Check food labels for the amount of sodium per serving. Choose foods with less than 5 percent of the  Daily Value of sodium. Generally, foods with less than 300 mg of sodium per serving fit into this eating plan.  To find whole grains, look for the word "whole" as the first word in the ingredient list. Shopping   Buy products labeled as "low-sodium" or "no salt added."  Buy fresh foods. Avoid canned foods and premade or frozen meals. Cooking   Avoid adding salt when cooking. Use salt-free seasonings or herbs instead of table salt or sea salt. Check with your health care provider or pharmacist before using salt substitutes.  Do not fry foods. Cook foods using healthy methods such as baking, boiling, grilling, and broiling instead.  Cook with heart-healthy oils, such as olive, canola, soybean, or sunflower oil. Meal  planning    Eat a balanced diet that includes:  5 or more servings of fruits and vegetables each day. At each meal, try to fill half of your plate with fruits and vegetables.  Up to 6-8 servings of whole grains each day.  Less than 6 oz of lean meat, poultry, or fish each day. A 3-oz serving of meat is about the same size as a deck of cards. One egg equals 1 oz.  2 servings of low-fat dairy each day.  A serving of nuts, seeds, or beans 5 times each week.  Heart-healthy fats. Healthy fats called Omega-3 fatty acids are found in foods such as flaxseeds and coldwater fish, like sardines, salmon, and mackerel.  Limit how much you eat of the following:  Canned or prepackaged foods.  Food that is high in trans fat, such as fried foods.  Food that is high in saturated fat, such as fatty meat.  Sweets, desserts, sugary drinks, and other foods with added sugar.  Full-fat dairy products.  Do not salt foods before eating.  Try to eat at least 2 vegetarian meals each week.  Eat more home-cooked food and less restaurant, buffet, and fast food.  When eating at a restaurant, ask that your food be prepared with less salt or no salt, if possible. What foods are recommended? The items listed may not be a complete list. Talk with your dietitian about what dietary choices are best for you. Grains  Whole-grain or whole-wheat bread. Whole-grain or whole-wheat pasta. Brown rice. Shelley Perez. Bulgur. Whole-grain and low-sodium cereals. Pita bread. Low-fat, low-sodium crackers. Whole-wheat flour tortillas. Vegetables  Fresh or frozen vegetables (raw, steamed, roasted, or grilled). Low-sodium or reduced-sodium tomato and vegetable juice. Low-sodium or reduced-sodium tomato sauce and tomato paste. Low-sodium or reduced-sodium canned vegetables. Fruits  All fresh, dried, or frozen fruit. Canned fruit in natural juice (without added sugar). Meat and other protein foods  Skinless chicken or  [place]. Ground chicken or [place]. Pork with fat trimmed off. Fish and seafood. Egg whites. Dried beans, peas, or lentils. Unsalted nuts, nut butters, and seeds. Unsalted canned beans. Lean cuts of beef with fat trimmed off. Low-sodium, lean deli meat. Dairy  Low-fat (1%) or fat-free (skim) milk. Fat-free, low-fat, or reduced-fat cheeses. Nonfat, low-sodium ricotta or cottage cheese. Low-fat or nonfat yogurt. Low-fat, low-sodium cheese. Fats and oils  Soft margarine without trans fats. Vegetable oil. Low-fat, reduced-fat, or light mayonnaise and salad dressings (reduced-sodium). Canola, safflower, olive, soybean, and sunflower oils. Avocado. Seasoning and other foods  Herbs. Spices. Seasoning mixes without salt. Unsalted popcorn and pretzels. Fat-free sweets. What foods are not recommended? The items listed may not be a complete list. Talk with your dietitian about what dietary choices are best for you. Grains  Baked goods made with fat, such as croissants, muffins, or some breads. Dry pasta or rice meal packs. Vegetables  Creamed or fried vegetables. Vegetables in a cheese sauce. Regular canned vegetables (not low-sodium or reduced-sodium). Regular canned tomato sauce and paste (not low-sodium or reduced-sodium). Regular tomato and vegetable juice (not low-sodium or reduced-sodium). Shelley Perez. Olives. Fruits  Canned fruit in a light or heavy syrup. Fried fruit. Fruit in cream or butter sauce. Meat and other protein foods  Fatty cuts of meat. Ribs. Fried meat. Shelley Perez. Sausage. Bologna and other processed lunch meats. Salami. Fatback. Hotdogs. Bratwurst. Salted nuts and seeds. Canned beans with added salt. Canned or smoked fish. Whole eggs or egg yolks. Chicken or [place] with skin. Dairy  Whole or 2% milk, cream, and half-and-half. Whole or full-fat cream cheese. Whole-fat or sweetened yogurt. Full-fat cheese. Nondairy creamers. Whipped toppings. Processed cheese and cheese spreads. Fats and oils   Butter. Stick margarine. Lard. Shortening. Ghee. Bacon fat. Tropical oils, such as coconut, palm kernel, or palm oil. Seasoning and other foods  Salted popcorn and pretzels. Onion salt, garlic salt, seasoned salt, table salt, and sea salt. Worcestershire sauce. Tartar sauce. Barbecue sauce. Teriyaki sauce. Soy sauce, including reduced-sodium. Steak sauce. Canned and packaged gravies. Fish sauce. Oyster sauce. Cocktail sauce. Horseradish that you find on the shelf. Ketchup. Mustard. Meat flavorings and tenderizers. Bouillon cubes. Hot sauce and Tabasco sauce. Premade or packaged marinades. Premade or packaged taco seasonings. Relishes. Regular salad dressings. Where to find more information:  National Heart, Lung, and [place]: https://wilson-eaton.com/  American Heart Association: www.heart.org Summary  The DASH eating plan is a healthy eating plan that has been shown to reduce high blood pressure (hypertension). It may also reduce your risk for type 2 diabetes, heart disease, and stroke.  With the DASH eating plan, you should limit salt (sodium) intake to 2,300 mg a day. If you have hypertension, you may need to reduce your sodium intake to 1,500 mg a day.  When on the DASH eating plan, aim to eat more fresh fruits and vegetables, whole grains, lean proteins, low-fat dairy, and heart-healthy fats.  Work with your health care provider or diet and nutrition specialist (dietitian) to adjust your eating plan to your individual calorie needs. This information is not intended to replace advice given to you by your health care provider. Make sure you discuss any questions you have with your health care provider. Document Released: 07/24/2011 Document Revised: 07/28/2016 Document Reviewed: 07/28/2016 Elsevier Interactive Patient Education  2017 [employer].

## 2016-12-20 ENCOUNTER — Telehealth: Payer: Self-pay | Admitting: *Deleted

## 2016-12-20 NOTE — Telephone Encounter (Signed)
During AWV patient brought in her [place] and asked if PCP would write a doctor's note excusing her of duty due to mental disability of PTSD. Adding that her counselor did this in the past but she no longer goes to counseling.

## 2016-12-21 NOTE — Progress Notes (Signed)
Medical screening examination/treatment/procedure(s) were performed by non-physician practitioner and as supervising physician I was immediately available for consultation/collaboration. I agree with above. Soha Thorup A Faithanne Verret, MD 

## 2016-12-23 NOTE — Telephone Encounter (Signed)
Patient has called back in regard.  States that she gave summons to [employer].  Would like to have a status update.  Can reach patient at [phone].

## 2016-12-23 NOTE — Telephone Encounter (Signed)
Called patient back as requested. Patient shared that she heard from [facility] earlier in regards to jury summons excuse letter. Patient stated that Wyvonne Lenz will contact her when the excuse is prepared.

## 2016-12-24 NOTE — Telephone Encounter (Signed)
Reviewed chart and pt does not been seen for PTSD and I do not have any records of mental illness to write letter.

## 2016-12-24 NOTE — Telephone Encounter (Signed)
PCP gave verbal okay to write letter.

## 2016-12-29 NOTE — Telephone Encounter (Signed)
Pt called back and said "Well i take Zoloft so how is it not in my records"  Please call back

## 2016-12-30 NOTE — Telephone Encounter (Signed)
Spoke with patient and states [facility] records should have came from Dr. Melford Aase.  Dr. Fayrene Fearing office only sent over immunizations.  Did contact Dr. Vanetta Mulders office.  They did not have anything on file for PTSD or any notes from [facility].  They are going to send the rest of the patients chart over.  Spoke to patient about contacting [facility] to get records sent over.  Notified patient that Stef was still going to complete letter that is needed before 5/21.  Once complete Stef will contact her to notify patient of to pickup copy.

## 2017-01-01 ENCOUNTER — Other Ambulatory Visit: Payer: Self-pay | Admitting: *Deleted

## 2017-01-01 DIAGNOSIS — F410 Panic disorder [episodic paroxysmal anxiety] without agoraphobia: Secondary | ICD-10-CM | POA: Insufficient documentation

## 2017-01-01 MED ORDER — POTASSIUM CHLORIDE ER 10 MEQ PO TBCR: EXTENDED_RELEASE_TABLET | ORAL | 5 refills | Status: DC

## 2017-01-01 NOTE — Telephone Encounter (Signed)
Letter written and given to tammy.

## 2017-01-05 ENCOUNTER — Ambulatory Visit: Payer: Medicare Other | Admitting: Internal Medicine

## 2017-01-07 ENCOUNTER — Telehealth: Payer: Self-pay | Admitting: Internal Medicine

## 2017-01-07 NOTE — Telephone Encounter (Signed)
Call her 

## 2017-01-07 NOTE — Telephone Encounter (Signed)
Patient no showed on 5/21 for follow up.  Please advise.

## 2017-01-09 NOTE — Telephone Encounter (Signed)
Left patient vm to call back to reschedule

## 2017-01-21 ENCOUNTER — Ambulatory Visit (INDEPENDENT_AMBULATORY_CARE_PROVIDER_SITE_OTHER): Payer: Medicare Other | Admitting: Internal Medicine

## 2017-01-21 ENCOUNTER — Other Ambulatory Visit (INDEPENDENT_AMBULATORY_CARE_PROVIDER_SITE_OTHER): Payer: Medicare Other

## 2017-01-21 ENCOUNTER — Encounter: Payer: Self-pay | Admitting: Internal Medicine

## 2017-01-21 VITALS — BP 130/78 | HR 73 | Temp 97.8°F | Resp 16 | Ht 61.0 in | Wt 193.2 lb

## 2017-01-21 DIAGNOSIS — K5900 Constipation, unspecified: Secondary | ICD-10-CM | POA: Diagnosis not present

## 2017-01-21 DIAGNOSIS — Z6836 Body mass index (BMI) 36.0-36.9, adult: Secondary | ICD-10-CM

## 2017-01-21 DIAGNOSIS — D473 Essential (hemorrhagic) thrombocythemia: Secondary | ICD-10-CM | POA: Diagnosis not present

## 2017-01-21 DIAGNOSIS — D75839 Thrombocytosis, unspecified: Secondary | ICD-10-CM

## 2017-01-21 DIAGNOSIS — E6609 Other obesity due to excess calories: Secondary | ICD-10-CM

## 2017-01-21 DIAGNOSIS — E785 Hyperlipidemia, unspecified: Secondary | ICD-10-CM

## 2017-01-21 DIAGNOSIS — Z1159 Encounter for screening for other viral diseases: Secondary | ICD-10-CM | POA: Diagnosis not present

## 2017-01-21 DIAGNOSIS — K5904 Chronic idiopathic constipation: Secondary | ICD-10-CM | POA: Insufficient documentation

## 2017-01-21 LAB — CBC WITH DIFFERENTIAL/PLATELET
BASOS ABS: 0.1 10*3/uL (ref 0.0–0.1)
Basophils Relative: 0.7 % (ref 0.0–3.0)
EOS PCT: 3.9 % (ref 0.0–5.0)
Eosinophils Absolute: 0.4 10*3/uL (ref 0.0–0.7)
HEMATOCRIT: 40.1 % (ref 36.0–46.0)
Hemoglobin: 13.4 g/dL (ref 12.0–15.0)
Lymphocytes Relative: 32.4 % (ref 12.0–46.0)
Lymphs Abs: 2.9 10*3/uL (ref 0.7–4.0)
MCHC: 33.5 g/dL (ref 30.0–36.0)
MCV: 88.2 fl (ref 78.0–100.0)
MONOS PCT: 8.6 % (ref 3.0–12.0)
Monocytes Absolute: 0.8 10*3/uL (ref 0.1–1.0)
NEUTROS ABS: 4.9 10*3/uL (ref 1.4–7.7)
Neutrophils Relative %: 54.4 % (ref 43.0–77.0)
PLATELETS: 497 10*3/uL — AB (ref 150.0–400.0)
RBC: 4.55 Mil/uL (ref 3.87–5.11)
RDW: 13.3 % (ref 11.5–15.5)
WBC: 9.1 10*3/uL (ref 4.0–10.5)

## 2017-01-21 LAB — URINALYSIS, ROUTINE W REFLEX MICROSCOPIC
Bilirubin Urine: NEGATIVE
Ketones, ur: NEGATIVE
Leukocytes, UA: NEGATIVE
NITRITE: NEGATIVE
PH: 6 (ref 5.0–8.0)
Specific Gravity, Urine: <=1.005 — AB (ref 1.000–1.030)
TOTAL PROTEIN, URINE-UPE24: NEGATIVE
URINE GLUCOSE: NEGATIVE
Urobilinogen, UA: 0.2 (ref 0.0–1.0)
WBC, UA: NONE SEEN (ref 0–?)

## 2017-01-21 LAB — VITAMIN B12: Vitamin B-12: 963 pg/mL — ABNORMAL HIGH (ref 211–911)

## 2017-01-21 LAB — COMPREHENSIVE METABOLIC PANEL
ALT: 17 U/L (ref 0–35)
AST: 20 U/L (ref 0–37)
Albumin: 4.6 g/dL (ref 3.5–5.2)
Alkaline Phosphatase: 88 U/L (ref 39–117)
BILIRUBIN TOTAL: 0.4 mg/dL (ref 0.2–1.2)
BUN: 17 mg/dL (ref 6–23)
CALCIUM: 9.9 mg/dL (ref 8.4–10.5)
CO2: 27 meq/L (ref 19–32)
Chloride: 103 mEq/L (ref 96–112)
Creatinine, Ser: 0.96 mg/dL (ref 0.40–1.20)
GFR: 61.75 mL/min (ref >60.00)
Glucose, Bld: 97 mg/dL (ref 70–99)
POTASSIUM: 3.9 meq/L (ref 3.5–5.1)
Sodium: 139 mEq/L (ref 135–145)
Total Protein: 7.9 g/dL (ref 6.0–8.3)

## 2017-01-21 LAB — LIPID PANEL
CHOL/HDL RATIO: 3
Cholesterol: 215 mg/dL — ABNORMAL HIGH (ref 0–200)
HDL: 62 mg/dL (ref >39.00)
LDL CALC: 122 mg/dL — AB (ref 0–99)
NONHDL: 152.68
TRIGLYCERIDES: 155 mg/dL — AB (ref 0.0–149.0)
VLDL: 31 mg/dL (ref 0.0–40.0)

## 2017-01-21 LAB — HEMOGLOBIN A1C: HEMOGLOBIN A1C: 5.6 % (ref 4.6–6.5)

## 2017-01-21 LAB — FOLATE: Folate: 23.3 ng/mL (ref >5.9)

## 2017-01-21 MED ORDER — LINACLOTIDE 145 MCG PO CAPS
145.0000 ug | ORAL_CAPSULE | Freq: Every day | ORAL | 3 refills | Status: DC
Start: 2017-01-21 — End: 2018-01-27

## 2017-01-21 NOTE — Progress Notes (Signed)
Subjective:  Patient ID: Shelley Perez, female    DOB: [birth year]  Age: 66 y.o. MRN: [id number]  CC: Constipation   HPI Loriann Bosserman presents for f/up - she complains of chronic constipation that does not respond to Dulcolax. 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 She offers no other complaints.  Outpatient Medications Prior to Visit  Medication Sig Dispense Refill  . aspirin 81 MG tablet Take 81 mg by mouth daily.    . budesonide-formoterol (SYMBICORT) 80-4.5 MCG/ACT inhaler Take 2 puffs first thing in am and then another 2 puffs about 12 hours later.    . cholecalciferol (VITAMIN D) 400 units TABS tablet Take 400 Units by mouth.    . diclofenac sodium (VOLTAREN) 1 % GEL Apply 2 g topically 4 (four) times daily. To affected joint. 100 g 11  . furosemide (LASIX) 20 MG tablet Take 1 tablet (20 mg total) by mouth 2 (two) times daily. 60 tablet 2  . omega-3 acid ethyl esters (LOVAZA) 1 g capsule Take by mouth 2 (two) times daily.    . potassium chloride (K-DUR) 10 MEQ tablet TAKE 1 TABLET(10 MEQ) BY MOUTH DAILY 30 tablet 5  . sertraline (ZOLOFT) 100 MG tablet Take 1 tablet (100 mg total) by mouth daily. 90 tablet 3  . docusate sodium (COLACE) 100 MG capsule Take 1 capsule (100 mg total) by mouth 2 (two) times daily. (Patient taking differently: Take 100 mg by mouth daily as needed. ) 10 capsule 0   No facility-administered medications prior to visit.     ROS Review of Systems  Constitutional: Negative.  Negative for appetite change, diaphoresis, fatigue and unexpected weight change.  HENT: Negative.   Eyes: Negative for visual disturbance.  Respiratory: Negative.  Negative for cough, chest tightness, shortness of breath and wheezing.   Cardiovascular: Negative.  Negative for chest pain, palpitations and leg swelling.  Gastrointestinal: Positive for constipation. Negative for abdominal pain, diarrhea, nausea and  vomiting.  Endocrine: Negative.   Genitourinary: Negative.  Negative for decreased urine volume, difficulty urinating, dysuria, hematuria and urgency.  Musculoskeletal: Negative.  Negative for back pain and myalgias.  Skin: Negative.   Allergic/Immunologic: Negative.   Neurological: Negative.  Negative for dizziness, weakness and numbness.  Hematological: Negative for adenopathy. Does not bruise/bleed easily.  Psychiatric/Behavioral: Negative.     Objective:  BP 130/78 (BP Location: Left Arm, Patient Position: Sitting, Cuff Size: Large)   Pulse 73   Temp 97.8 F (36.6 C) (Oral)   Resp 16   Ht 5\' 1"  (1.549 m)   Wt 193 lb 4 oz (87.7 kg)   SpO2 96%   BMI 36.51 kg/m   BP Readings from Last 3 Encounters:  01/21/17 130/78  12/19/16 115/80  11/06/16 (!) 138/55    Wt Readings from Last 3 Encounters:  01/21/17 193 lb 4 oz (87.7 kg)  12/19/16 197 lb (89.4 kg)  11/06/16 193 lb (87.5 kg)    Physical Exam  Constitutional: She is oriented to person, place, and time. No distress.  HENT:  Mouth/Throat: No oropharyngeal exudate.  Eyes: Conjunctivae are normal. Right eye exhibits no discharge. Left eye exhibits no discharge. No scleral icterus.  Neck: Normal range of motion. Neck supple. No JVD present. No thyromegaly present.  Cardiovascular: Normal rate, regular rhythm and normal heart sounds.  Exam reveals no gallop.   No murmur heard. Pulmonary/Chest: Effort normal and breath sounds normal. No respiratory distress.  She has no wheezes. She has no rales. She exhibits no tenderness.  Abdominal: Soft. Bowel sounds are normal. She exhibits no distension and no mass. There is no tenderness. There is no rebound.  Musculoskeletal: Normal range of motion. She exhibits no edema or tenderness.  Neurological: She is alert and oriented to person, place, and time. She has normal reflexes.  Skin: Skin is warm and dry. No rash noted. She is not diaphoretic. No erythema. No pallor.  Vitals  reviewed.   Lab Results  Component Value Date   WBC 9.1 01/21/2017   HGB 13.4 01/21/2017   HCT 40.1 01/21/2017   PLT 497.0 (H) 01/21/2017   GLUCOSE 97 01/21/2017   CHOL 215 (H) 01/21/2017   TRIG 155.0 (H) 01/21/2017   HDL 62.00 01/21/2017   LDLCALC 122 (H) 01/21/2017   ALT 17 01/21/2017   AST 20 01/21/2017   NA 139 01/21/2017   K 3.9 01/21/2017   CL 103 01/21/2017   CREATININE 0.96 01/21/2017   BUN 17 01/21/2017   CO2 27 01/21/2017   TSH 2.92 01/21/2017   INR 1.06 06/29/2015   HGBA1C 5.6 01/21/2017    Dg Elbow Complete Right  Result Date: 10/18/2016 CLINICAL DATA:  Injured elbow yesterday. Posterior elbow pain and swelling. EXAM: RIGHT ELBOW - COMPLETE 3+ VIEW COMPARISON:  None. FINDINGS: The joint spaces are maintained. No acute bony findings. Tiny spur noted at the olecranon process. No definite joint effusion. IMPRESSION: No acute bony findings, osteochondral lesion or joint effusion. Electronically Signed   By: Marijo Sanes M.D.   On: 10/18/2016 17:17    Assessment & Plan:   Christalyn was seen today for constipation.  Diagnoses and all orders for this visit:  Need for hepatitis C screening test -     Hepatitis C antibody; Future  Constipation, unspecified constipation type- her labs are negative for any secondary or organic causes, she takes no medications that are associated with constipation, will treat with lens asked. -     Comprehensive metabolic panel; Future -     linaclotide (LINZESS) 145 MCG CAPS capsule; Take 1 capsule (145 mcg total) by mouth daily before breakfast. -     Urinalysis, Routine w reflex microscopic; Future  Hyperlipidemia LDL goal <130- her Framingham risk score is low at 4% so I do not recommend that she start taking a statin for CV risk reduction. -     Lipid panel; Future -     Comprehensive metabolic panel; Future -     Thyroid Panel With TSH; Future  Thrombocytosis ([place])- her B12 level is normal now and her platelet count is  improving, the rest of her CBC is normal with no evidence of anemia or other blood cell dyscrasias. This appears to be a benign finding and I will monitor in the future. -     CBC with Differential/Platelet; Future -     Folate; Future -     Vitamin B12; Future  Class 2 obesity due to excess calories with body mass index (BMI) of 36.0 to 36.9 in adult, unspecified whether serious comorbidity present -     Hemoglobin A1c; Future   I have discontinued Ms. Segar's docusate sodium. I am also having her start on linaclotide. Additionally, I am having her maintain her aspirin, budesonide-formoterol, sertraline, diclofenac sodium, furosemide, cholecalciferol, omega-3 acid ethyl esters, potassium chloride, and Omega-3 Fatty Acids (OMEGA 3 500 PO).  Meds ordered this encounter  Medications  . Omega-3 Fatty Acids (OMEGA 3 500  PO)    Sig: Take 1,000 mg by mouth.  . linaclotide (LINZESS) 145 MCG CAPS capsule    Sig: Take 1 capsule (145 mcg total) by mouth daily before breakfast.    Dispense:  90 capsule    Refill:  3     Follow-up: Return in about 4 months (around 05/23/2017).  Scarlette Calico, MD

## 2017-01-21 NOTE — Patient Instructions (Signed)

## 2017-01-22 LAB — THYROID PANEL WITH TSH
Free Thyroxine Index: 2.5 (ref 1.4–3.8)
T3 Uptake: 26 % (ref 22–35)
T4, Total: 9.5 ug/dL (ref 4.5–12.0)
TSH: 2.92 mIU/L

## 2017-01-22 LAB — HEPATITIS C ANTIBODY: HCV AB: NEGATIVE

## 2017-01-28 ENCOUNTER — Telehealth: Payer: Self-pay | Admitting: Internal Medicine

## 2017-01-28 NOTE — Telephone Encounter (Signed)
Pt called in and would like someone to call with her lab results

## 2017-01-29 NOTE — Telephone Encounter (Signed)
Her platelet count was slightly elevated Her B12 level was a little high There was still some blood in her urine, this is been seen for a year so it's probably benign  The other labs are all okay

## 2017-02-02 NOTE — Telephone Encounter (Signed)
LVM for pt to call back as soon as possible.   

## 2017-02-03 NOTE — Telephone Encounter (Signed)
Pt called back in, she said she will be home til 5 today

## 2017-02-05 NOTE — Telephone Encounter (Signed)
Spoke to pt and gave results. Pt stated understanding.

## 2017-04-07 ENCOUNTER — Ambulatory Visit (INDEPENDENT_AMBULATORY_CARE_PROVIDER_SITE_OTHER): Payer: Medicare Other | Admitting: Internal Medicine

## 2017-04-07 ENCOUNTER — Encounter: Payer: Self-pay | Admitting: Internal Medicine

## 2017-04-07 ENCOUNTER — Telehealth: Payer: Self-pay | Admitting: Internal Medicine

## 2017-04-07 ENCOUNTER — Ambulatory Visit: Payer: Medicare Other | Admitting: Internal Medicine

## 2017-04-07 VITALS — BP 132/74 | HR 68 | Temp 97.5°F | Resp 16 | Ht 61.0 in | Wt 192.0 lb

## 2017-04-07 DIAGNOSIS — B029 Zoster without complications: Secondary | ICD-10-CM | POA: Diagnosis not present

## 2017-04-07 MED ORDER — PREDNISONE 10 MG PO TABS: 30.0000 mg | ORAL_TABLET | Freq: Two times a day (BID) | ORAL | 0 refills | Status: DC

## 2017-04-07 MED ORDER — VALACYCLOVIR HCL 1 G PO TABS: 1000.0000 mg | ORAL_TABLET | Freq: Two times a day (BID) | ORAL | 0 refills | Status: AC

## 2017-04-07 MED ORDER — OXYCODONE-ACETAMINOPHEN 7.5-325 MG PO TABS: 1.0000 | ORAL_TABLET | ORAL | 0 refills | Status: DC | PRN

## 2017-04-07 MED ORDER — PREDNISONE 5 MG PO TABS: 15.0000 mg | ORAL_TABLET | Freq: Two times a day (BID) | ORAL | 0 refills | Status: AC

## 2017-04-07 MED ORDER — PREDNISONE 2.5 MG PO TABS: 7.5000 mg | ORAL_TABLET | Freq: Two times a day (BID) | ORAL | 0 refills | Status: AC

## 2017-04-07 NOTE — Patient Instructions (Signed)
Shingles Shingles, which is also known as herpes zoster, is an infection that causes a painful skin rash and fluid-filled blisters. Shingles is not related to genital herpes, which is a sexually transmitted infection. Shingles only develops in people who:  Have had chickenpox.  Have received the chickenpox vaccine. (This is rare.)  What are the causes? Shingles is caused by varicella-zoster virus (VZV). This is the same virus that causes chickenpox. After exposure to VZV, the virus stays in the body in an inactive (dormant) state. Shingles develops if the virus reactivates. This can happen many years after the initial exposure to VZV. It is not known what causes this virus to reactivate. What increases the risk? People who have had chickenpox or received the chickenpox vaccine are at risk for shingles. Infection is more common in people who:  Are older than age 50.  Have a weakened defense (immune) system, such as those with HIV, AIDS, or cancer.  Are taking medicines that weaken the immune system, such as transplant medicines.  Are under great stress.  What are the signs or symptoms? Early symptoms of this condition include itching, tingling, and pain in an area on your skin. Pain may be described as burning, stabbing, or throbbing. A few days or weeks after symptoms start, a painful red rash appears, usually on one side of the body in a bandlike or beltlike pattern. The rash eventually turns into fluid-filled blisters that break open, scab over, and dry up in about 2-3 weeks. At any time during the infection, you may also develop:  A fever.  Chills.  A headache.  An upset stomach.  How is this diagnosed? This condition is diagnosed with a skin exam. Sometimes, skin or fluid samples are taken from the blisters before a diagnosis is made. These samples are examined under a microscope or sent to a lab for testing. How is this treated? There is no specific cure for this condition.  Your health care provider will probably prescribe medicines to help you manage pain, recover more quickly, and avoid long-term problems. Medicines may include:  Antiviral drugs.  Anti-inflammatory drugs.  Pain medicines.  If the area involved is on your face, you may be referred to a specialist, such as an eye doctor (ophthalmologist) or an ear, nose, and throat (ENT) doctor to help you avoid eye problems, chronic pain, or disability. Follow these instructions at home: Medicines  Take medicines only as directed by your health care provider.  Apply an anti-itch or numbing cream to the affected area as directed by your health care provider. Blister and Rash Care  Take a cool bath or apply cool compresses to the area of the rash or blisters as directed by your health care provider. This may help with pain and itching.  Keep your rash covered with a loose bandage (dressing). Wear loose-fitting clothing to help ease the pain of material rubbing against the rash.  Keep your rash and blisters clean with mild soap and cool water or as directed by your health care provider.  Check your rash every day for signs of infection. These include redness, swelling, and pain that lasts or increases.  Do not pick your blisters.  Do not scratch your rash. General instructions  Rest as directed by your health care provider.  Keep all follow-up visits as directed by your health care provider. This is important.  Until your blisters scab over, your infection can cause chickenpox in people who have never had it or been vaccinated   against it. To prevent this from happening, avoid contact with other people, especially: ? Babies. ? Pregnant women. ? Children who have eczema. ? Elderly people who have transplants. ? People who have chronic illnesses, such as leukemia or AIDS. Contact a health care provider if:  Your pain is not relieved with prescribed medicines.  Your pain does not get better after  the rash heals.  Your rash looks infected. Signs of infection include redness, swelling, and pain that lasts or increases. Get help right away if:  The rash is on your face or nose.  You have facial pain, pain around your eye area, or loss of feeling on one side of your face.  You have ear pain or you have ringing in your ear.  You have loss of taste.  Your condition gets worse. This information is not intended to replace advice given to you by your health care provider. Make sure you discuss any questions you have with your health care provider. Document Released: 08/04/2005 Document Revised: 03/30/2016 Document Reviewed: 06/15/2014 Elsevier Interactive Patient Education  2017 [employer]  

## 2017-04-07 NOTE — Telephone Encounter (Signed)
Spoke to pt and she wanted to know if it was okay to take the prednisone having arthritis. Informed patient that is was okay to take even with arthritis.

## 2017-04-07 NOTE — Progress Notes (Signed)
Subjective:  Patient ID: Shelley Perez, female    DOB: [birth year]  Age: 66 y.o. MRN: [id number]  CC: Rash   HPI Shelley Perez presents for a 1 week hx of painful rash in her RUE. She describes the pain as burning. The rash has not responded to [facility] 2.5% cream.  Outpatient Medications Prior to Visit  Medication Sig Dispense Refill  . aspirin 81 MG tablet Take 81 mg by mouth daily.    . budesonide-formoterol (SYMBICORT) 80-4.5 MCG/ACT inhaler Take 2 puffs first thing in am and then another 2 puffs about 12 hours later.    . cholecalciferol (VITAMIN D) 400 units TABS tablet Take 400 Units by mouth.    . diclofenac sodium (VOLTAREN) 1 % GEL Apply 2 g topically 4 (four) times daily. To affected joint. 100 g 11  . furosemide (LASIX) 20 MG tablet Take 1 tablet (20 mg total) by mouth 2 (two) times daily. 60 tablet 2  . linaclotide (LINZESS) 145 MCG CAPS capsule Take 1 capsule (145 mcg total) by mouth daily before breakfast. 90 capsule 3  . omega-3 acid ethyl esters (LOVAZA) 1 g capsule Take by mouth 2 (two) times daily.    . Omega-3 Fatty Acids (OMEGA 3 500 PO) Take 1,000 mg by mouth.    . potassium chloride (K-DUR) 10 MEQ tablet TAKE 1 TABLET(10 MEQ) BY MOUTH DAILY 30 tablet 5  . sertraline (ZOLOFT) 100 MG tablet Take 1 tablet (100 mg total) by mouth daily. 90 tablet 3   No facility-administered medications prior to visit.     ROS Review of Systems  Constitutional: Negative for chills, diaphoresis, fatigue and fever.  HENT: Negative.  Negative for trouble swallowing.   Eyes: Negative for visual disturbance.  Respiratory: Negative for cough, chest tightness and shortness of breath.   Cardiovascular: Negative for chest pain, palpitations and leg swelling.  Gastrointestinal: Negative for abdominal pain, diarrhea, nausea and vomiting.  Endocrine: Negative.   Genitourinary: Negative.   Musculoskeletal: Negative for back pain and myalgias.  Skin: Positive for rash. Negative for color  change.  Allergic/Immunologic: Negative.   Neurological: Negative.  Negative for dizziness.  Hematological: Negative for adenopathy. Does not bruise/bleed easily.  Psychiatric/Behavioral: Negative.     Objective:  BP 132/74 (BP Location: Left Arm, Patient Position: Sitting, Cuff Size: Large)   Pulse 68   Temp (!) 97.5 F (36.4 C) (Oral)   Resp 16   Ht 5\' 1"  (1.549 m)   Wt 192 lb (87.1 kg)   SpO2 98%   BMI 36.28 kg/m   BP Readings from Last 3 Encounters:  04/07/17 132/74  01/21/17 130/78  12/19/16 115/80    Wt Readings from Last 3 Encounters:  04/07/17 192 lb (87.1 kg)  01/21/17 193 lb 4 oz (87.7 kg)  12/19/16 197 lb (89.4 kg)    Physical Exam  Constitutional: She is oriented to person, place, and time. No distress.  HENT:  Mouth/Throat: Oropharynx is clear and moist. No oropharyngeal exudate.  Eyes: Conjunctivae are normal. Right eye exhibits no discharge. Left eye exhibits no discharge. No scleral icterus.  Neck: Normal range of motion. Neck supple. No thyromegaly present.  Cardiovascular: Normal rate, regular rhythm and intact distal pulses.  Exam reveals no gallop.   No murmur heard. Pulmonary/Chest: Effort normal and breath sounds normal. No respiratory distress. She has no wheezes. She has no rales. She exhibits no tenderness.  Abdominal: Soft. Bowel sounds are normal. She exhibits no distension and no mass. There is no  tenderness. There is no rebound and no guarding.  Musculoskeletal: Normal range of motion. She exhibits no edema or tenderness.       Arms: Lymphadenopathy:    She has no cervical adenopathy.  Neurological: She is alert and oriented to person, place, and time.  Skin: Skin is warm and dry. Rash noted. Rash is vesicular. Rash is not macular, not papular, not maculopapular and not pustular. She is not diaphoretic. There is erythema. No pallor.  Vitals reviewed.   Lab Results  Component Value Date   WBC 9.1 01/21/2017   HGB 13.4 01/21/2017   HCT  40.1 01/21/2017   PLT 497.0 (H) 01/21/2017   GLUCOSE 97 01/21/2017   CHOL 215 (H) 01/21/2017   TRIG 155.0 (H) 01/21/2017   HDL 62.00 01/21/2017   LDLCALC 122 (H) 01/21/2017   ALT 17 01/21/2017   AST 20 01/21/2017   NA 139 01/21/2017   K 3.9 01/21/2017   CL 103 01/21/2017   CREATININE 0.96 01/21/2017   BUN 17 01/21/2017   CO2 27 01/21/2017   TSH 2.92 01/21/2017   INR 1.06 06/29/2015   HGBA1C 5.6 01/21/2017    Dg Elbow Complete Right  Result Date: 10/18/2016 CLINICAL DATA:  Injured elbow yesterday. Posterior elbow pain and swelling. EXAM: RIGHT ELBOW - COMPLETE 3+ VIEW COMPARISON:  None. FINDINGS: The joint spaces are maintained. No acute bony findings. Tiny spur noted at the olecranon process. No definite joint effusion. IMPRESSION: No acute bony findings, osteochondral lesion or joint effusion. Electronically Signed   By: Marijo Sanes M.D.   On: 10/18/2016 17:17    Assessment & Plan:   Shelley Perez was seen today for rash.  Diagnoses and all orders for this visit:  Herpes zoster without complication- Will treat the infection with Valtrex. Will reduce acute pain with prednisone, will also control the pain with percocet. -     predniSONE (DELTASONE) 10 MG tablet; Take 3 tablets (30 mg total) by mouth 2 (two) times daily with a meal. -     predniSONE (DELTASONE) 2.5 MG tablet; Take 3 tablets (7.5 mg total) by mouth 2 (two) times daily with a meal. -     predniSONE (DELTASONE) 5 MG tablet; Take 3 tablets (15 mg total) by mouth 2 (two) times daily with a meal. -     oxyCODONE-acetaminophen (PERCOCET) 7.5-325 MG tablet; Take 1 tablet by mouth every 4 (four) hours as needed. -     valACYclovir (VALTREX) 1000 MG tablet; Take 1 tablet (1,000 mg total) by mouth 2 (two) times daily.   I am having Shelley Perez start on predniSONE, predniSONE, predniSONE, oxyCODONE-acetaminophen, and valACYclovir. I am also having her maintain her aspirin, budesonide-formoterol, sertraline, diclofenac sodium,  furosemide, cholecalciferol, omega-3 acid ethyl esters, potassium chloride, Omega-3 Fatty Acids (OMEGA 3 500 PO), and linaclotide.  Meds ordered this encounter  Medications  . predniSONE (DELTASONE) 10 MG tablet    Sig: Take 3 tablets (30 mg total) by mouth 2 (two) times daily with a meal.    Dispense:  42 tablet    Refill:  0  . predniSONE (DELTASONE) 2.5 MG tablet    Sig: Take 3 tablets (7.5 mg total) by mouth 2 (two) times daily with a meal.    Dispense:  42 tablet    Refill:  0  . predniSONE (DELTASONE) 5 MG tablet    Sig: Take 3 tablets (15 mg total) by mouth 2 (two) times daily with a meal.    Dispense:  42 tablet  Refill:  0  . oxyCODONE-acetaminophen (PERCOCET) 7.5-325 MG tablet    Sig: Take 1 tablet by mouth every 4 (four) hours as needed.    Dispense:  35 tablet    Refill:  0  . valACYclovir (VALTREX) 1000 MG tablet    Sig: Take 1 tablet (1,000 mg total) by mouth 2 (two) times daily.    Dispense:  14 tablet    Refill:  0     Follow-up: Return in about 3 weeks (around 04/28/2017).  Scarlette Calico, MD

## 2017-04-07 NOTE — Telephone Encounter (Signed)
Pt called in little concerned about all the prednisone she is taking.  She said it just seemed like a lot .  She would like a nurse to call her

## 2017-04-14 ENCOUNTER — Other Ambulatory Visit (INDEPENDENT_AMBULATORY_CARE_PROVIDER_SITE_OTHER): Payer: Medicare Other

## 2017-04-14 ENCOUNTER — Encounter: Payer: Self-pay | Admitting: Family Medicine

## 2017-04-14 ENCOUNTER — Ambulatory Visit (INDEPENDENT_AMBULATORY_CARE_PROVIDER_SITE_OTHER): Payer: Medicare Other | Admitting: Family Medicine

## 2017-04-14 VITALS — BP 150/80 | HR 54 | Temp 97.7°F | Ht 61.0 in | Wt 192.0 lb

## 2017-04-14 DIAGNOSIS — B029 Zoster without complications: Secondary | ICD-10-CM | POA: Diagnosis not present

## 2017-04-14 DIAGNOSIS — Q649 Congenital malformation of urinary system, unspecified: Secondary | ICD-10-CM

## 2017-04-14 LAB — URINALYSIS
Bilirubin Urine: NEGATIVE
Leukocytes, UA: NEGATIVE
NITRITE: NEGATIVE
PH: 6 (ref 5.0–8.0)
Specific Gravity, Urine: 1.025 (ref 1.000–1.030)
TOTAL PROTEIN, URINE-UPE24: NEGATIVE
URINE GLUCOSE: NEGATIVE
Urobilinogen, UA: 0.2 (ref 0.0–1.0)

## 2017-04-14 LAB — BASIC METABOLIC PANEL
BUN: 23 mg/dL (ref 6–23)
CHLORIDE: 102 meq/L (ref 96–112)
CO2: 32 meq/L (ref 19–32)
CREATININE: 0.98 mg/dL (ref 0.40–1.20)
Calcium: 9.4 mg/dL (ref 8.4–10.5)
GFR: 60.26 mL/min (ref >60.00)
GLUCOSE: 109 mg/dL — AB (ref 70–99)
Potassium: 4 mEq/L (ref 3.5–5.1)
Sodium: 139 mEq/L (ref 135–145)

## 2017-04-14 NOTE — Patient Instructions (Addendum)
Thank you for coming in,   We will call you with the results from today. Please stop the prednisone.    Please feel free to call with any questions or concerns at any time, at [phone]. --Dr. Raeford Razor

## 2017-04-14 NOTE — Progress Notes (Signed)
Shelley Perez - 66 y.o. female MRN [id number]  Date of birth: 07/21/1951  SUBJECTIVE:  Including CC & ROS.  Chief Complaint  Patient presents with  . Urinary Retention    patient states that she is on prednisone for the shingles and it states that if she was having urinary retension to see doctor. patient states she has not been able to urinate all day yesterday and very little today.    Shelley Perez is a 66 rolled female that is presenting with complaints of urinary retention. She reports that the symptoms have been occurring for about a day now. Reports she was taking prednisone and acyclovir for a shingles outbreak and on the bottle description reports to contact her medical provider if she has urinary problems. She denies any fever or chills. She feels like she has some contractions of her urethra. She is having some pain while urinating but not significant. She does have a history of a kidney stone. She has not taken any medications for this. These symptoms are only associated with urinating. Has no increased urgency or frequency.   Review of her lab work on 6/6 shows normal kidney function.  Review of Systems  Gastrointestinal: Negative for abdominal pain.  Genitourinary: Positive for dysuria. Negative for frequency, hematuria and urgency.    HISTORY: Past Medical, Surgical, Social, and Family History Reviewed & Updated per EMR.   Pertinent Historical Findings include:  Past Medical History:  Diagnosis Date  . Anemia   . Anxiety   . Arthritis   . Asthma   . Complication of anesthesia    "they gave me too much and I had to stay 3 days " 2005 after choley   . COPD (chronic obstructive pulmonary disease) ([place])   . Depression   . Fibromyalgia   . History of kidney stones   . Hx of pancreatitis    with gallstones  . Hyperlipidemia   . Insomnia   . Osteoporosis     Past Surgical History:  Procedure Laterality Date  . c sections    . CHOLECYSTECTOMY    . DENTAL SURGERY      . FOOT SURGERY    . KNEE SURGERY  1978 / 1971  . SHOULDER SURGERY    . 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 Rosuvastatin Calcium     Other reaction(s): Other Muscle and joint pain  . Atorvastatin     Muscle and joint pain  . Codeine Nausea Only and Other (See Comments)    Stomach pains  . Crestor [Rosuvastatin]     Muscle and joint pain  . Prozac [Fluoxetine Hcl] Other (See Comments)    Changes her personality  . Latex Rash  . Sulfa Antibiotics Rash    Family History  Problem Relation Age of Onset  . Hyperlipidemia Mother   . Hypertension Mother   . Heart attack Mother   . Cancer Father   . Asthma Sister   . Lung cancer Sister        smoked     Social History   Social History  . Marital status: Single    Spouse name: N/A  . Number of children: N/A  . Years of education: N/A   Occupational History  . Not on file.   Social History Main Topics  . Smoking status: Former  Smoker    Packs/day: 1.00    Years: 40.00    Types: Cigarettes    Quit date: 06/28/2013  . Smokeless tobacco: Never Used  . Alcohol use No  . Drug use: No  . Sexual activity: No   Other Topics Concern  . Not on file   Social History Narrative  . No narrative on file     PHYSICAL EXAM:  VS: BP (!) 150/80 (BP Location: Left Arm, Patient Position: Sitting, Cuff Size: Normal)   Pulse (!) 54   Temp 97.7 F (36.5 C) (Oral)   Ht 5\' 1"  (1.549 m)   Wt 192 lb (87.1 kg)   SpO2 100%   BMI 36.28 kg/m  Physical Exam Gen: NAD, alert, cooperative with exam,  ENT: normal lips, normal nasal mucosa,  Eye: normal EOM, normal conjunctiva and lids CV:  no edema, +2 pedal pulses   Resp: no accessory muscle use, non-labored,  GI: no masses or tenderness, no hernia, some mild suprapubic tenderness  Skin: no rashes, no areas  of induration  Neuro: normal tone, normal sensation to touch Psych:  normal insight, alert and oriented MSK: Normal gait, normal strength      ASSESSMENT & PLAN:   Urinary anomaly Unclear if her symptoms are associated with the prednisone or her anatomy. Chest history of 3 pregnancies but were C-section deliveries. Has not had these complaints previously. Has not seen a urologist before. - Stopped the prednisone today. Has completed acyclovir. - BMP and urinalysis - If worsening then advised to be seen at urgent care for an in and out catheter  Herpes zoster without complication Her outbreak seems to be resolving. - Stopped her prednisone. She has been on this for 7 days. - Has completed acyclovir.

## 2017-04-14 NOTE — Assessment & Plan Note (Signed)
Her outbreak seems to be resolving. - Stopped her prednisone. She has been on this for 7 days. - Has completed acyclovir.

## 2017-04-14 NOTE — Assessment & Plan Note (Addendum)
Unclear if her symptoms are associated with the prednisone or her anatomy. Chest history of 3 pregnancies but were C-section deliveries. Has not had these complaints previously. Has not seen a urologist before. - Stopped the prednisone today. Has completed acyclovir. - BMP and urinalysis - If worsening then advised to be seen at urgent care for an in and out catheter

## 2017-04-15 ENCOUNTER — Telehealth: Payer: Self-pay | Admitting: Internal Medicine

## 2017-04-15 NOTE — Telephone Encounter (Signed)
She should stop taking the prednisone

## 2017-04-15 NOTE — Telephone Encounter (Signed)
Please advise thanks.

## 2017-04-15 NOTE — Telephone Encounter (Signed)
Pt saw Shelley Perez yesterday. Pt called stating today she has developed blisters in her mouth. She would like a call back as to what she can do.

## 2017-04-15 NOTE — Telephone Encounter (Signed)
stef can you look at this .  Pt would would like call back today   Best number [phone]

## 2017-04-15 NOTE — Telephone Encounter (Signed)
Spoke to pt and she feels like she may have developed thrush from the prednisone. Pt would also like to know if she needs to restart the prednisone as she has urinated several times since her appt yesterday when the prednisone was discontinued due to urinary retention.   Please advise

## 2017-04-16 NOTE — Telephone Encounter (Signed)
Spoke with patient about her questions. She will stop the prednisone and her blisters are getting better. She will follow up if her symptoms don't improve.   Rosemarie Ax, MD [facility] 04/16/2017, 9:06 AM

## 2017-04-28 ENCOUNTER — Encounter: Payer: Self-pay | Admitting: Internal Medicine

## 2017-04-28 ENCOUNTER — Ambulatory Visit (INDEPENDENT_AMBULATORY_CARE_PROVIDER_SITE_OTHER): Payer: Medicare Other | Admitting: Internal Medicine

## 2017-04-28 VITALS — BP 116/60 | HR 75 | Temp 98.3°F | Resp 16 | Ht 61.0 in | Wt 185.2 lb

## 2017-04-28 DIAGNOSIS — J988 Other specified respiratory disorders: Secondary | ICD-10-CM | POA: Diagnosis not present

## 2017-04-28 DIAGNOSIS — R05 Cough: Secondary | ICD-10-CM | POA: Diagnosis not present

## 2017-04-28 DIAGNOSIS — J449 Chronic obstructive pulmonary disease, unspecified: Secondary | ICD-10-CM

## 2017-04-28 DIAGNOSIS — J441 Chronic obstructive pulmonary disease with (acute) exacerbation: Secondary | ICD-10-CM

## 2017-04-28 DIAGNOSIS — R059 Cough, unspecified: Secondary | ICD-10-CM

## 2017-04-28 LAB — POCT EXHALED NITRIC OXIDE: FeNO level (ppb): 19

## 2017-04-28 MED ORDER — METHYLPREDNISOLONE 4 MG PO TBPK: ORAL_TABLET | ORAL | 0 refills | Status: DC

## 2017-04-28 MED ORDER — PROMETHAZINE-DM 6.25-15 MG/5ML PO SYRP: 5.0000 mL | ORAL_SOLUTION | Freq: Four times a day (QID) | ORAL | 0 refills | Status: DC | PRN

## 2017-04-28 MED ORDER — CEFDINIR 300 MG PO CAPS: 300.0000 mg | ORAL_CAPSULE | Freq: Two times a day (BID) | ORAL | 0 refills | Status: DC

## 2017-04-28 NOTE — Progress Notes (Signed)
Subjective:  Patient ID: Shelley Perez, female    DOB: Feb 13, 1951  Age: 66 y.o. MRN: [id number]  CC: Cough   HPI Shirel Mallis presents for f/up - The painful shingles rash on her right upper extremity has resolved but today she complains of a 3 day history of cough that's productive of thick yellow phlegm with chills and wheezing.  Outpatient Medications Prior to Visit  Medication Sig Dispense Refill  . aspirin 81 MG tablet Take 81 mg by mouth daily.    . budesonide-formoterol (SYMBICORT) 80-4.5 MCG/ACT inhaler Take 2 puffs first thing in am and then another 2 puffs about 12 hours later.    . cholecalciferol (VITAMIN D) 400 units TABS tablet Take 1,000 Units by mouth.     . diclofenac sodium (VOLTAREN) 1 % GEL Apply 2 g topically 4 (four) times daily. To affected joint. 100 g 11  . furosemide (LASIX) 20 MG tablet Take 1 tablet (20 mg total) by mouth 2 (two) times daily. 60 tablet 2  . linaclotide (LINZESS) 145 MCG CAPS capsule Take 1 capsule (145 mcg total) by mouth daily before breakfast. 90 capsule 3  . Omega-3 Fatty Acids (OMEGA 3 500 PO) Take 1,000 mg by mouth.    . potassium chloride (K-DUR) 10 MEQ tablet TAKE 1 TABLET(10 MEQ) BY MOUTH DAILY 30 tablet 5  . sertraline (ZOLOFT) 100 MG tablet Take 1 tablet (100 mg total) by mouth daily. 90 tablet 3  . omega-3 acid ethyl esters (LOVAZA) 1 g capsule Take by mouth 2 (two) times daily.    Marland Kitchen oxyCODONE-acetaminophen (PERCOCET) 7.5-325 MG tablet Take 1 tablet by mouth every 4 (four) hours as needed. 35 tablet 0   No facility-administered medications prior to visit.     ROS Review of Systems  Constitutional: Positive for chills. Negative for appetite change, fatigue and fever.  HENT: Positive for sore throat. Negative for facial swelling, sinus pressure and trouble swallowing.   Eyes: Negative.   Respiratory: Positive for cough and wheezing. Negative for chest tightness, shortness of breath and stridor.   Cardiovascular: Negative.   Negative for chest pain, palpitations and leg swelling.  Gastrointestinal: Negative for abdominal pain, constipation, diarrhea, nausea and vomiting.  Endocrine: Negative.   Genitourinary: Negative.  Negative for difficulty urinating and dysuria.  Musculoskeletal: Negative.  Negative for back pain and myalgias.  Skin: Negative.  Negative for color change and rash.  Allergic/Immunologic: Negative.   Neurological: Negative.  Negative for dizziness, weakness and light-headedness.  Hematological: Negative.  Negative for adenopathy. Does not bruise/bleed easily.  Psychiatric/Behavioral: Negative.     Objective:  BP 116/60 (BP Location: Left Arm, Patient Position: Sitting, Cuff Size: Large)   Pulse 75   Temp 98.3 F (36.8 C) (Oral)   Resp 16   Ht 5\' 1"  (1.549 m)   Wt 185 lb 4 oz (84 kg)   SpO2 99%   BMI 35.00 kg/m   BP Readings from Last 3 Encounters:  04/28/17 116/60  04/14/17 (!) 150/80  04/07/17 132/74    Wt Readings from Last 3 Encounters:  04/28/17 185 lb 4 oz (84 kg)  04/14/17 192 lb (87.1 kg)  04/07/17 192 lb (87.1 kg)    Physical Exam  Constitutional: She is oriented to person, place, and time. No distress.  HENT:  Mouth/Throat: Oropharynx is clear and moist. No oropharyngeal exudate.  Eyes: Conjunctivae are normal. Right eye exhibits no discharge. Left eye exhibits no discharge. No scleral icterus.  Neck: Normal range of motion. Neck  supple. No JVD present. No thyromegaly present.  Cardiovascular: Normal rate, regular rhythm and intact distal pulses.  Exam reveals no gallop and no friction rub.   No murmur heard. Pulmonary/Chest: Effort normal. No accessory muscle usage. No tachypnea. No respiratory distress. She has no decreased breath sounds. She has no wheezes. She has rhonchi in the right middle field and the left middle field. She has no rales. She exhibits no tenderness.  Abdominal: Soft. Bowel sounds are normal. She exhibits no distension and no mass. There is no  tenderness. There is no rebound and no guarding.  Musculoskeletal: Normal range of motion. She exhibits no edema, tenderness or deformity.  Lymphadenopathy:    She has no cervical adenopathy.  Neurological: She is alert and oriented to person, place, and time.  Skin: Skin is warm and dry. No rash noted. She is not diaphoretic. No erythema. No pallor.  Vitals reviewed.   Lab Results  Component Value Date   WBC 9.1 01/21/2017   HGB 13.4 01/21/2017   HCT 40.1 01/21/2017   PLT 497.0 (H) 01/21/2017   GLUCOSE 109 (H) 04/14/2017   CHOL 215 (H) 01/21/2017   TRIG 155.0 (H) 01/21/2017   HDL 62.00 01/21/2017   LDLCALC 122 (H) 01/21/2017   ALT 17 01/21/2017   AST 20 01/21/2017   NA 139 04/14/2017   K 4.0 04/14/2017   CL 102 04/14/2017   CREATININE 0.98 04/14/2017   BUN 23 04/14/2017   CO2 32 04/14/2017   TSH 2.92 01/21/2017   INR 1.06 06/29/2015   HGBA1C 5.6 01/21/2017    Dg Elbow Complete Right  Result Date: 10/18/2016 CLINICAL DATA:  Injured elbow yesterday. Posterior elbow pain and swelling. EXAM: RIGHT ELBOW - COMPLETE 3+ VIEW COMPARISON:  None. FINDINGS: The joint spaces are maintained. No acute bony findings. Tiny spur noted at the olecranon process. No definite joint effusion. IMPRESSION: No acute bony findings, osteochondral lesion or joint effusion. Electronically Signed   By: Marijo Sanes M.D.   On: 10/18/2016 17:17    Assessment & Plan:   Surina was seen today for cough.  Diagnoses and all orders for this visit:  COPD GOLD 0 - she is having a mild flare, will cont symbicort and a course of systemic steroids  RTI (respiratory tract infection)- will treat the infection with cefdinir and will control the cough with phenergan-dm -     cefdinir (OMNICEF) 300 MG capsule; Take 1 capsule (300 mg total) by mouth 2 (two) times daily. -     promethazine-dextromethorphan (PROMETHAZINE-DM) 6.25-15 MG/5ML syrup; Take 5 mLs by mouth 4 (four) times daily as needed for cough.  COPD  with acute exacerbation ([place])- her FeNo score is mildly elevated so I have asked her to take a course of systemic steroids -     methylPREDNISolone (MEDROL DOSEPAK) 4 MG TBPK tablet; TAKE AS DIRECTED  Cough -     POCT EXHALED NITRIC OXIDE   I have discontinued Ms. Armato's omega-3 acid ethyl esters and oxyCODONE-acetaminophen. I am also having her start on cefdinir, promethazine-dextromethorphan, and methylPREDNISolone. Additionally, I am having her maintain her aspirin, budesonide-formoterol, sertraline, diclofenac sodium, furosemide, cholecalciferol, potassium chloride, Omega-3 Fatty Acids (OMEGA 3 500 PO), and linaclotide.  Meds ordered this encounter  Medications  . cefdinir (OMNICEF) 300 MG capsule    Sig: Take 1 capsule (300 mg total) by mouth 2 (two) times daily.    Dispense:  20 capsule    Refill:  0  . promethazine-dextromethorphan (PROMETHAZINE-DM) 6.25-15 MG/5ML  syrup    Sig: Take 5 mLs by mouth 4 (four) times daily as needed for cough.    Dispense:  118 mL    Refill:  0  . methylPREDNISolone (MEDROL DOSEPAK) 4 MG TBPK tablet    Sig: TAKE AS DIRECTED    Dispense:  21 tablet    Refill:  0     Follow-up: Return if symptoms worsen or fail to improve.  Scarlette Calico, MD

## 2017-04-28 NOTE — Patient Instructions (Signed)
Cough, Adult Coughing is a reflex that clears your throat and your airways. Coughing helps to heal and protect your lungs. It is normal to cough occasionally, but a cough that happens with other symptoms or lasts a long time may be a sign of a condition that needs treatment. A cough may last only 2-3 weeks (acute), or it may last longer than 8 weeks (chronic). What are the causes? Coughing is commonly caused by:  Breathing in substances that irritate your lungs.  A viral or bacterial respiratory infection.  Allergies.  Asthma.  Postnasal drip.  Smoking.  Acid backing up from the stomach into the esophagus (gastroesophageal reflux).  Certain medicines.  Chronic lung problems, including COPD (or rarely, lung cancer).  Other medical conditions such as heart failure.  Follow these instructions at home: Pay attention to any changes in your symptoms. Take these actions to help with your discomfort:  Take medicines only as told by your health care provider. ? If you were prescribed an antibiotic medicine, take it as told by your health care provider. Do not stop taking the antibiotic even if you start to feel better. ? Talk with your health care provider before you take a cough suppressant medicine.  Drink enough fluid to keep your urine clear or pale yellow.  If the air is dry, use a cold steam vaporizer or humidifier in your bedroom or your home to help loosen secretions.  Avoid anything that causes you to cough at work or at home.  If your cough is worse at night, try sleeping in a semi-upright position.  Avoid cigarette smoke. If you smoke, quit smoking. If you need help quitting, ask your health care provider.  Avoid caffeine.  Avoid alcohol.  Rest as needed.  Contact a health care provider if:  You have new symptoms.  You cough up pus.  Your cough does not get better after 2-3 weeks, or your cough gets worse.  You cannot control your cough with suppressant  medicines and you are losing sleep.  You develop pain that is getting worse or pain that is not controlled with pain medicines.  You have a fever.  You have unexplained weight loss.  You have night sweats. Get help right away if:  You cough up blood.  You have difficulty breathing.  Your heartbeat is very fast. This information is not intended to replace advice given to you by your health care provider. Make sure you discuss any questions you have with your health care provider. Document Released: 01/31/2011 Document Revised: 01/10/2016 Document Reviewed: 10/11/2014 Elsevier Interactive Patient Education  2017 [employer]  

## 2017-04-30 ENCOUNTER — Ambulatory Visit: Payer: Medicare Other | Admitting: Internal Medicine

## 2017-05-06 ENCOUNTER — Telehealth: Payer: Self-pay | Admitting: Internal Medicine

## 2017-05-06 NOTE — Telephone Encounter (Signed)
Pt called stating that she has completed the prednisone that was prescribed and has one day left on the antibiotic but is not doing any better than she was when she was seen on 04/28/2017. What should she do? Please advise.

## 2017-05-06 NOTE — Telephone Encounter (Signed)
She said that nothing has changed, yellow mucus, coughing, she is horse.  She said that she has taken all over meds and it is not any better.

## 2017-05-06 NOTE — Telephone Encounter (Signed)
What are her  symptoms?  

## 2017-05-08 ENCOUNTER — Other Ambulatory Visit: Payer: Self-pay | Admitting: Internal Medicine

## 2017-05-08 DIAGNOSIS — J988 Other specified respiratory disorders: Secondary | ICD-10-CM

## 2017-05-08 MED ORDER — AMOXICILLIN-POT CLAVULANATE 875-125 MG PO TABS: 1.0000 | ORAL_TABLET | Freq: Two times a day (BID) | ORAL | 0 refills | Status: AC

## 2017-05-08 NOTE — Telephone Encounter (Signed)
Stop the current antibiotic Change to new antibiotic - RX sent

## 2017-05-08 NOTE — Telephone Encounter (Signed)
Pt informed rx was sent.  

## 2017-05-11 ENCOUNTER — Telehealth: Payer: Self-pay | Admitting: Internal Medicine

## 2017-05-11 NOTE — Telephone Encounter (Signed)
Patient also stated she is coughing. Her primary doctor prescribed her an antibiotic.

## 2017-05-11 NOTE — Telephone Encounter (Signed)
Spoke with pt.  Pt is requesting refill for albuterol neb.  I advised pt that we do not have Albuterol on her med list.  She is not sure who prescribed this.  She is currently under the care of Dr Ronnald Ramp for a respiratory infection.  I advised pt that Dr Melvyn Novas has not seen her since 12/2015 and we would have to see her in the office in order to prescribe anything for her.  Pt states she does not feel well enough to come in and states that she will just finish the abx that Dr Ronnald Ramp has her on and then follow up with him.  Nothing further needed.

## 2017-05-12 ENCOUNTER — Encounter: Payer: Self-pay | Admitting: Family Medicine

## 2017-05-12 ENCOUNTER — Ambulatory Visit (INDEPENDENT_AMBULATORY_CARE_PROVIDER_SITE_OTHER): Payer: Medicare Other | Admitting: Family Medicine

## 2017-05-12 VITALS — BP 149/79 | HR 73 | Temp 97.5°F | Resp 20 | Wt 183.8 lb

## 2017-05-12 DIAGNOSIS — J441 Chronic obstructive pulmonary disease with (acute) exacerbation: Secondary | ICD-10-CM | POA: Diagnosis not present

## 2017-05-12 MED ORDER — ALBUTEROL SULFATE HFA 108 (90 BASE) MCG/ACT IN AERS: 2.0000 | INHALATION_SPRAY | RESPIRATORY_TRACT | 0 refills | Status: DC | PRN

## 2017-05-12 MED ORDER — IPRATROPIUM-ALBUTEROL 0.5-2.5 (3) MG/3ML IN SOLN
3.0000 mL | Freq: Once | RESPIRATORY_TRACT | Status: AC
  Administered 2017-05-12: 3 mL via RESPIRATORY_TRACT

## 2017-05-12 MED ORDER — FLUCONAZOLE 150 MG PO TABS: 150.0000 mg | ORAL_TABLET | Freq: Once | ORAL | 0 refills | Status: AC

## 2017-05-12 MED ORDER — AZITHROMYCIN 250 MG PO TABS: ORAL_TABLET | ORAL | 0 refills | Status: DC

## 2017-05-12 NOTE — Progress Notes (Signed)
Shelley Perez , [birth year], 66 y.o., female MRN: [id number] Patient Care Team    Relationship Specialty Notifications Start End  Janith Lima, MD PCP - General Internal Medicine  01/29/16     Chief Complaint  Patient presents with  . Wheezing    shortness of breath     Subjective: Pt presents for an OV with complaints of Wheezing and shortness of breath. Patient reports she has been seen by her primary care physician a few weeks ago and provided with a Omnicef and oral steroid prescription for COPD exacerbation, with upper respiratory illness. Omnicef was recently switched to Augmentin for additional coverage. She reports compliance with her Symbicort inhaler, but she does feel the medicine worked better for her at the higher dose. She reports she had been on albuterol nebulizers in the past, however she thinks this was provided to her through her prior PCP or hospitalization. She also has a pulmonologist. Patient states she feels a little better as far as the upper respiratory infection, she denies fever, chills, nausea, vomiting or diarrhea. She does however endorse increased cough and yellow sputum. She feels like she is "drowning in sputum." She reports she was giving a cough syrup, but wants to try to cough up the secretions.   Depression screen [facility] 2/9 12/19/2016 12/19/2016 11/11/2015 11/10/2015  Decreased Interest 1 0 0 0  Down, Depressed, Hopeless 1 1 0 0  PHQ - 2 Score 2 1 0 0  Altered sleeping 1 - - -  Tired, decreased energy 0 - - -  Change in appetite 0 - - -  Feeling bad or failure about yourself  0 - - -  Trouble concentrating 0 - - -  Moving slowly or fidgety/restless 0 - - -  Suicidal thoughts 0 - - -  PHQ-9 Score 3 - - -    Allergies  Allergen Reactions  . Rosuvastatin Calcium     Other reaction(s): Other Muscle and joint pain  . Atorvastatin     Muscle and joint pain  . Codeine Nausea Only and Other (See Comments)    Stomach pains  . Crestor [Rosuvastatin]      Muscle and joint pain  . Prozac [Fluoxetine Hcl] Other (See Comments)    Changes her personality  . Latex Rash  . Sulfa Antibiotics Rash   Social History  Substance Use Topics  . Smoking status: Former Smoker    Packs/day: 1.00    Years: 40.00    Types: Cigarettes    Quit date: 06/28/2013  . Smokeless tobacco: Never Used  . Alcohol use No   Past Medical History:  Diagnosis Date  . Anemia   . Anxiety   . Arthritis   . Asthma   . Complication of anesthesia    "they gave me too much and I had to stay 3 days " 2005 after choley   . COPD (chronic obstructive pulmonary disease) ([place])   . Depression   . Fibromyalgia   . History of kidney stones   . Hx of pancreatitis    with gallstones  . Hyperlipidemia   . Insomnia   . Osteoporosis    Past Surgical History:  Procedure Laterality Date  . c sections    . CHOLECYSTECTOMY    . DENTAL SURGERY    . FOOT SURGERY    . KNEE SURGERY  1978 / 1971  . SHOULDER SURGERY    . 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 Hyperlipidemia Mother   . Hypertension Mother   . Heart attack Mother   . Cancer Father   . Asthma Sister   . Lung cancer Sister        smoked   Allergies as of 05/12/2017      Reactions   Rosuvastatin Calcium    Other reaction(s): Other Muscle and joint pain   Atorvastatin    Muscle and joint pain   Codeine Nausea Only, Other (See Comments)   Stomach pains   Crestor [rosuvastatin]    Muscle and joint pain   Prozac [fluoxetine Hcl] Other (See Comments)   Changes her personality   Latex Rash   Sulfa Antibiotics Rash      Medication List       Accurate as of 05/12/17  4:20 PM. Always use your most recent med list.          albuterol 108 (90 Base) MCG/ACT inhaler Commonly known as:  PROVENTIL HFA;VENTOLIN  HFA Inhale 2 puffs into the lungs every 4 (four) hours as needed for wheezing or shortness of breath.   amoxicillin-clavulanate 875-125 MG tablet Commonly known as:  AUGMENTIN Take 1 tablet by mouth 2 (two) times daily.   aspirin 81 MG tablet Take 81 mg by mouth daily.   azithromycin 250 MG tablet Commonly known as:  ZITHROMAX 500 mg day 1, then 250 mg QD   budesonide-formoterol 80-4.5 MCG/ACT inhaler Commonly known as:  SYMBICORT Take 2 puffs first thing in am and then another 2 puffs about 12 hours later.   cholecalciferol 400 units Tabs tablet Commonly known as:  VITAMIN D Take 1,000 Units by mouth.   diclofenac sodium 1 % Gel Commonly known as:  VOLTAREN Apply 2 g topically 4 (four) times daily. To affected joint.   furosemide 20 MG tablet Commonly known as:  LASIX Take 1 tablet (20 mg total) by mouth 2 (two) times daily.   linaclotide 145 MCG Caps capsule Commonly known as:  LINZESS Take 1 capsule (145 mcg total) by mouth daily before breakfast.   OMEGA 3 500 PO Take 1,000 mg by mouth.   potassium chloride 10 MEQ tablet Commonly known as:  K-DUR TAKE 1 TABLET(10 MEQ) BY MOUTH DAILY   promethazine-dextromethorphan 6.25-15 MG/5ML syrup Commonly known as:  PROMETHAZINE-DM Take 5 mLs by mouth 4 (four) times daily as needed for cough.   sertraline 100 MG tablet Commonly known as:  ZOLOFT Take 1 tablet (100 mg total) by mouth daily.            Discharge Care Instructions        Start     Ordered   05/12/17 1615  IPRATROPIUM-ALBUTEROL 0.5-2.5 (3) MG/3ML IN SOLN   Once     05/12/17 1609   05/12/17 1545  IPRATROPIUM-ALBUTEROL 0.5-2.5 (3) MG/3ML IN SOLN   Once     05/12/17 1544   05/12/17 0000  azithromycin (ZITHROMAX) 250 MG tablet     05/12/17 1606   05/12/17 0000  albuterol (PROVENTIL HFA;VENTOLIN HFA) 108 (90 Base) MCG/ACT inhaler  Every 4 hours PRN     05/12/17 1606      All past medical history, surgical history, allergies, family history,  immunizations andmedications were updated in the EMR today and reviewed under the history and medication portions of their EMR.     ROS:  Negative, with the exception of above mentioned in HPI   Objective:  BP (!) 149/79 (BP Location: Right Arm, Patient Position: Sitting, Cuff Size: Normal)   Pulse 73   Temp (!) 97.5 F (36.4 C)   Resp 20   Wt 183 lb 12 oz (83.3 kg)   SpO2 97%   BMI 34.72 kg/m  Body mass index is 34.72 kg/m. Gen: Afebrile. No acute distress. Nontoxic in appearance, well developed, well nourished. Pulse ox 94% when initially roomed. HENT: AT. [place]. Bilateral TM visualized Within normal limits. MMM, no oral lesions. Bilateral nares without erythema or drainage. Throat without erythema or exudates. Cough present. Eyes:Pupils Equal Round Reactive to light, Extraocular movements intact,  Conjunctiva without redness, discharge or icterus. Neck/lymp/endocrine: Supple, no lymphadenopathy CV: RRR, no edema Chest: Diffuse rhonchi and wheezing present. Cough with deep inspiration present. Mildly increased work of breath present.  Neuro:  Normal gait. PERLA. EOMi. Alert. Oriented x3  No exam data present No results found. No results found for this or any previous visit (from the past 24 hour(s)).  Assessment/Plan: Shelley Perez is a 66 y.o. female present for OV for  COPD with acute exacerbation ([place]) - Patient with diffuse wheezing and rhonchi present, with pulse ox 94% on presentation. 2 rounds of DuoNeb treatment provided today in office with great improvement in air movement, pulse oximetry increased to 97%, patient felt she could "finally breathe". - Add azithromycin to treatment plan for COPD exacerbation and anti-inflammatory effects. - Albuterol 1-2 puffs every 4 hours for the next 24 hours, then as needed only every 4-6 hours when necessary. - Rest, hydrate, add plain Mucinex - ipratropium-albuterol (DUONEB) 0.5-2.5 (3) MG/3ML nebulizer solution 3 mL; Take 3 mLs by  nebulization once. - ipratropium-albuterol (DUONEB) 0.5-2.5 (3) MG/3ML nebulizer solution 3 mL; Take 3 mLs by nebulization once. - Follow-up appointment to 2 weeks, if no improvement sooner if worsening with PCP or pulmonologist.   Reviewed expectations re: course of current medical issues.  Discussed self-management of symptoms.  Outlined signs and symptoms indicating need for more acute intervention.  Patient verbalized understanding and all questions were answered.  Patient received an After-Visit Summary.    No orders of the defined types were placed in this encounter.    Note is dictated utilizing voice recognition software. Although note has been proof read prior to signing, occasional typographical errors still can be missed. If any questions arise, please do not hesitate to call for verification.   electronically signed by:  Howard Pouch, DO  [place]

## 2017-05-12 NOTE — Patient Instructions (Addendum)
Albuterol inhaler prescribed for you today. You can use every 4-6 hours if needed only during illness. You should not need this medication everyday. If you use this more then 2x a week when not ill, you should talk to your pulmonologist as you may need medication changes.   I have also added z-pack for extra coverage for your COPD exacerbation.  Use mucinex plain to help decrease secretions.     Chronic Obstructive Pulmonary Disease Exacerbation Chronic obstructive pulmonary disease (COPD) is a common lung problem. In COPD, the flow of air from the lungs is limited. COPD exacerbations are times that breathing gets worse and you need extra treatment. Without treatment they can be life threatening. If they happen often, your lungs can become more damaged. If your COPD gets worse, your doctor may treat you with:  Medicines.  Oxygen.  Different ways to clear your airway, such as using a mask.  Follow these instructions at home:  Do not smoke.  Avoid tobacco smoke and other things that bother your lungs.  If given, take your antibiotic medicine as told. Finish the medicine even if you start to feel better.  Only take medicines as told by your doctor.  Drink enough fluids to keep your pee (urine) clear or pale yellow (unless your doctor has told you not to).  Use a cool mist machine (vaporizer).  If you use oxygen or a machine that turns liquid medicine into a mist (nebulizer), continue to use them as told.  Keep up with shots (vaccinations) as told by your doctor.  Exercise regularly.  Eat healthy foods.  Keep all doctor visits as told. Get help right away if:  You are very short of breath and it gets worse.  You have trouble talking.  You have bad chest pain.  You have blood in your spit (sputum).  You have a fever.  You keep throwing up (vomiting).  You feel weak, or you pass out (faint).  You feel confused.  You keep getting worse. This information is not  intended to replace advice given to you by your health care provider. Make sure you discuss any questions you have with your health care provider. Document Released: 07/24/2011 Document Revised: 01/10/2016 Document Reviewed: 04/08/2013 Elsevier Interactive Patient Education  2017 [employer].

## 2017-05-13 DIAGNOSIS — R000 Tachycardia, unspecified: Secondary | ICD-10-CM | POA: Diagnosis not present

## 2017-05-13 DIAGNOSIS — Z87891 Personal history of nicotine dependence: Secondary | ICD-10-CM | POA: Diagnosis not present

## 2017-05-13 DIAGNOSIS — M797 Fibromyalgia: Secondary | ICD-10-CM | POA: Diagnosis not present

## 2017-05-13 DIAGNOSIS — J9602 Acute respiratory failure with hypercapnia: Secondary | ICD-10-CM | POA: Diagnosis not present

## 2017-05-13 DIAGNOSIS — D473 Essential (hemorrhagic) thrombocythemia: Secondary | ICD-10-CM | POA: Diagnosis not present

## 2017-05-13 DIAGNOSIS — Z7982 Long term (current) use of aspirin: Secondary | ICD-10-CM | POA: Diagnosis not present

## 2017-05-13 DIAGNOSIS — R0602 Shortness of breath: Secondary | ICD-10-CM | POA: Diagnosis not present

## 2017-05-13 DIAGNOSIS — Z79899 Other long term (current) drug therapy: Secondary | ICD-10-CM | POA: Diagnosis not present

## 2017-05-13 DIAGNOSIS — J441 Chronic obstructive pulmonary disease with (acute) exacerbation: Secondary | ICD-10-CM | POA: Diagnosis not present

## 2017-05-13 DIAGNOSIS — Z9049 Acquired absence of other specified parts of digestive tract: Secondary | ICD-10-CM | POA: Diagnosis not present

## 2017-05-13 DIAGNOSIS — D72829 Elevated white blood cell count, unspecified: Secondary | ICD-10-CM | POA: Diagnosis not present

## 2017-05-21 ENCOUNTER — Ambulatory Visit (INDEPENDENT_AMBULATORY_CARE_PROVIDER_SITE_OTHER): Payer: Medicare Other | Admitting: Internal Medicine

## 2017-05-21 ENCOUNTER — Encounter: Payer: Self-pay | Admitting: Internal Medicine

## 2017-05-21 VITALS — BP 130/76 | HR 76 | Ht 61.0 in | Wt 185.2 lb

## 2017-05-21 DIAGNOSIS — J441 Chronic obstructive pulmonary disease with (acute) exacerbation: Secondary | ICD-10-CM

## 2017-05-21 DIAGNOSIS — J449 Chronic obstructive pulmonary disease, unspecified: Secondary | ICD-10-CM

## 2017-05-21 DIAGNOSIS — R05 Cough: Secondary | ICD-10-CM | POA: Diagnosis not present

## 2017-05-21 DIAGNOSIS — R058 Other specified cough: Secondary | ICD-10-CM

## 2017-05-21 MED ORDER — PREDNISONE 10 MG PO TABS: ORAL_TABLET | ORAL | 0 refills | Status: DC

## 2017-05-21 MED ORDER — BUDESONIDE-FORMOTEROL FUMARATE 80-4.5 MCG/ACT IN AERO: 2.0000 | INHALATION_SPRAY | Freq: Two times a day (BID) | RESPIRATORY_TRACT | 0 refills | Status: DC

## 2017-05-21 MED ORDER — IPRATROPIUM-ALBUTEROL 0.5-2.5 (3) MG/3ML IN SOLN: RESPIRATORY_TRACT | Status: DC

## 2017-05-21 NOTE — Assessment & Plan Note (Signed)
S/p ET placement for knee surgery 06/2015  - ENT Eval 03/19/16 shoemaker: GERD only  - Flare since 03/2017 partially responsive to steroids/ duoneb - added flutter valve 05/21/2017 and resumed bid h2   DDX of  difficult airways management almost all start with A and  include Adherence, Ace Inhibitors, Acid Reflux, Active Sinus Disease, Alpha 1 Antitripsin deficiency, Anxiety masquerading as Airways dz,  ABPA,  Allergy(esp in young), Aspiration (esp in elderly), Adverse effects of meds,  Active smokers, A bunch of PE's (a small clot burden can't cause this syndrome unless there is already severe underlying pulm or vascular dz with poor reserve) plus two Bs  = Bronchiectasis and Beta blocker use..and one C= CHF  Adherence is always the initial "prime suspect" and is a multilayered concern that requires a "trust but verify" approach in every patient - starting with knowing how to use medications, especially inhalers, correctly, keeping up with refills and understanding the fundamental difference between maintenance and prns vs those medications only taken for a very short course and then stopped and not refilled.  - see hfa teaching   ? Acid (or non-acid) GERD > always difficult to exclude as up to 75% of pts in some series report no assoc GI/ Heartburn symptoms> rec max (24h)  acid suppression on h2 bid as can't tol ppi and diet restrictions/ reviewed and instructions given in writing.   ? Allergy / asthma > Prednisone 10 mg take  4 each am x 2 days,   2 each am x 2 days,  1 each am x 2 days and stop   ? Anxiety > usually at the bottom of this list of usual suspects but should be much higher on this pt's based on H and P and note already on psychotropics and probably interfering in her ability to follow instructions  ? Active sinus dz > consider sinus ct if persists   I had an extended discussion with the patient reviewing all relevant studies completed to date and  lasting 25 minutes of a 40  minute  post hops f/u office visit transition of care to re-establish with me     re  severe non-specific but potentially very serious refractory respiratory symptoms of uncertain and potentially multiple  etiologies.   Each maintenance medication was reviewed in detail including most importantly the difference between maintenance and prns and under what circumstances the prns are to be triggered using an action plan format that is not reflected in the computer generated alphabetically organized AVS.    Please see AVS for specific instructions unique to this office visit that I personally wrote and verbalized to the the pt in detail and then reviewed with pt  by my nurse highlighting any changes in therapy/plan of care  recommended at today's visit.

## 2017-05-21 NOTE — Assessment & Plan Note (Signed)
11/22/2015   try symbicort 160 2bid  - 12/11/2015  After extensive coaching HFA effectiveness =  75%> try reducing to symb 80 2bid  - PFT's  12/28/2015  FEV1 1.95 (91 % ) ratio 85  p 6 % improvement from saba p no rx prior to study with DLCO  85 % corrects to 106 % for alv volume     05/21/2017  After extensive coaching HFA effectiveness =    75% from a baseline of 50%   She is technically a COPD 0 with pfts that normalize and just needs to use the symb 80 consistently and effectively   Needs to return with all meds in hand using a trust but verify approach to confirm accurate Medication  Reconciliation The principal here is that until we are certain that the  patients are doing what we've asked, it makes no sense to ask them to do more.

## 2017-05-21 NOTE — Progress Notes (Signed)
Subjective:    Patient ID: Shelley Perez, female    DOB: Feb 25, 1951,   MRN: [id number]    Brief patient profile:  4 yowf quit smoking 2015 with some sensation of midline chest discomfort brought on by smoking that resolved @ 172lb p quit smoking with no breathing problems though tendency for colds going to chest and persistent sore throat since ET 11/22/116 for R knee then  worst episode middle of Feb 2017 >  sev ov's rx as flu with tamiflu / pred/ abx/ only some better so referred to pulmonary clinic 11/22/2015 by Dr  Philis Fendt at [facility]   History of Present Illness  11/22/2015 [facility] office visit/ Shelley Perez   Chief Complaint  Patient presents with  . Pulmonary Consult    Referred by Dr. Philis Fendt. Pt c/o cough and SOB since she was dxed with Influenza in Feb 2017. Cough is prod with yellow sputum.    rx with doxy and prednisone x 4 days and qvar not better. Cough is worse at hs and in am and doe x more than slow walk on flat level = MMRC2 Having to use neb saba qid whereas prior to feb 2017 was not using it at all and prev on no maint rx  rec Plan A = Automatic =  symbicort 160 Take 2 puffs first thing in am and then another 2 puffs about 12 hours later.  Plan B = Backup  ok to use the nebulizer up to every 4 hours but if start needing it regularly call for immediate appointment GERD diet    12/11/2015  f/u ov/Shelley Perez re: ? Copd/ab vs uacs  maint on symb160 2bid  Chief Complaint  Patient presents with  . Follow-up    Pt states her breathing has improved and she is coughing less. She still has some chest congestion- worse on rainy days. Her cough is prod with minimal yellow sputum.  She has not needed albuterol.   having some noct coughing and congestion and some better with symbicort  Turns out had ET in Novemeber 2016 and wasn't told or doesn't remember that her spinal was apparently not adequate at time of knee surgery and she was converted to GA - since then  constant  urge to clear throat but no excess/ purulent sputum or mucus plugs and no noct events rec Plan A = Automatic =  symbicort 80 take 2 puffs first thing in am and then another 2 puffs about 12 hours later.  Plan B = Backup  ok to use the nebulizer up to every 4 hours  Pantoprazole (protonix) 40 mg   Take  30-60 min before first meal of the day and Pepcid (famotidine)  20 mg one @  bedtime until return to office     12/28/2015  f/u ov/Shelley Perez re: GOLD 0 copd/ mild AB/uacs Chief Complaint  Patient presents with  . Follow-up    Breathing has improved. She has not had to use albuterol inhaler or neb.   can't take ppi regularly due to joints aching  Not taking pepcid at all  rec You do have significant copd and unlikely you ever will  Continue symbicort 80 Take 2 puffs first thing in am and then another 2 puffs about 12 hours later if you think you need it.  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 Hospitalization Follow-up    Pt in hospital 05/13/17-05/15/17 due to COPD. States that she is still coughing with yellow to white mucus and c/o SOB all the time that is worse early in the morning and at night. Pt states that she is sore in the chest due to being hospitalized.  after last visit did fine on symbicort 80 2pffs daily but stopped it after several months but still had trouble had trouble with mowing grass due to cough/ sob and did not call as better when avoided exposure  [place] since late summer  2018 with recurrent cough/sob  eval by Ronnald Ramp 04/28/17 and no better p omnicef and prednisone  admtted 9/26-28/ 2018  [place] with dx "aecopd" rx with steroids and duoneb and better but not back to baseline ex tol and "never want to get that bad again. / not on any gerd rx   Still with coughing fits/ quite harsh with obvious pseudowheeze  And can't sleep flat due to  cough and chest gen sore ant with cough   No obvious patterns now to  day to day or daytime variability or truly  excess/ purulent sputum or mucus plugs or hemoptysis or  chest tightness, subjective wheeze or overt sinus or hb symptoms. No unusual exp hx or h/o childhood pna/ asthma or knowledge of premature birth.  Sleeping ok flat without nocturnal  or early am exacerbation  of respiratory  c/o's or need for noct saba. Also denies any obvious fluctuation of symptoms with weather or environmental changes or other aggravating or alleviating factors except as outlined above   Current Allergies, Complete Past Medical History, Past Surgical History, Family History, and Social History were reviewed in [employer] record.  ROS  The following are not active complaints unless bolded Hoarseness, sore throat, dysphagia, dental problems, itching, sneezing,  nasal congestion or discharge of excess mucus or purulent secretions, ear ache,   fever, chills, sweats, unintended wt loss or wt gain, classically pleuritic or exertional cp,  orthopnea pnd or leg swelling, presyncope, palpitations, abdominal pain, anorexia, nausea, vomiting, diarrhea  or change in bowel habits or change in bladder habits, change in stools or change in urine, dysuria, hematuria,  rash, arthralgias, visual complaints, headache, numbness, weakness or ataxia or problems with walking or coordination,  change in mood/affect or memory.        Current Meds  Medication Sig  . albuterol (PROVENTIL HFA;VENTOLIN HFA) 108 (90 Base) MCG/ACT inhaler Inhale 2 puffs into the lungs every 4 (four) hours as needed for wheezing or shortness of breath.  Marland Kitchen aspirin 81 MG tablet Take 81 mg by mouth daily.  . budesonide-formoterol (SYMBICORT) 80-4.5 MCG/ACT inhaler Take 2 puffs first thing in am and then another 2 puffs about 12 hours later.  . furosemide (LASIX) 20 MG tablet Take 1 tablet (20 mg total) by mouth 2 (two) times daily.  Marland Kitchen  ipratropium-albuterol (DUONEB) 0.5-2.5 (3) MG/3ML SOLN Up to one every 4 hours as needed  . potassium chloride (K-DUR) 10 MEQ tablet TAKE 1 TABLET(10 MEQ) BY MOUTH DAILY  . sertraline (ZOLOFT) 100 MG tablet Take 1 tablet (100 mg total) by mouth daily.  . [DISCONTINUED] diclofenac sodium (VOLTAREN) 1 % GEL Apply 2 g topically 4 (four) times daily. To affected joint.  . [DISCONTINUED] guaifenesin (HUMIBID E) 400 MG TABS tablet Take by mouth.  . [DISCONTINUED] ipratropium-albuterol (DUONEB) 0.5-2.5 (3) MG/3ML SOLN Take 3 mLs by nebulization 4 (four) times  daily.                Objective:   Physical Exam  Elderly wf  nad  Harsh barking coughing fits -  Patient failed to answer a single question asked in a straightforward manner, tending to go off on tangents or answer questions with ambiguous medical terms or diagnoses and seemed aggravated  when asked the same question more than once for clarification.    eg "when did your breathing start getting worse"   A  When I took the antibiotics for the shingles        ? When was that ?   A  "Dr Ronnald Ramp treated me for bronchitis"    05/21/2017       185  12/28/2015       187  12/11/2015        187   11/22/15 191 lb 12.8 oz (87 kg)  11/11/15 184 lb 12.8 oz (83.825 kg)  11/10/15 187 lb (84.823 kg)    Vital signs reviewed - Note on arrival 02 sats  93% on RA     HEENT: nl   turbinates, and oropharynx. Nl external ear canals without cough reflex- upper dentures    NECK :  without JVD/Nodes/TM/ nl carotid upstrokes bilaterally   LUNGS: no acc muscle use,  Nl contour chest with true late   exp rhonchi bilaterally     CV:  RRR  no s3 or murmur or increase in P2, no edema   ABD:  soft and nontender with nl inspiratory excursion in the supine position. No bruits or organomegaly, bowel sounds nl  MS:  Nl gait/ ext warm without deformities, calf tenderness, cyanosis or clubbing No obvious joint restrictions   SKIN: warm and dry without lesions      NEURO:  alert, approp, nl sensorium with  no motor deficits     I personally reviewed images and agree with radiology impression as follows:  CT Chest   11/16/15 1. Small area tree-in-bud opacity in the right middle lobe. Imaging features suggest atypical infection.        Assessment & Plan:

## 2017-05-21 NOTE — Patient Instructions (Addendum)
For cough > mucinex dm up to 1200 mg every 12 hours with flutter valve as much as possible  And if still coughing then use your cough syrup as needed    Plan A = Automatic = symbicort  80 Take 2 puffs first thing in am and then another 2 puffs about 12 hours later.  Pecpid 20 mg after bfast and supper   Work on inhaler technique:  relax and gently blow all the way out then take a nice smooth deep breath back in, triggering the inhaler at same time you start breathing in.  Hold for up to 5 seconds if you can. Blow out thru nose. Rinse and gargle with water when done  GERD (REFLUX)  is an extremely common cause of respiratory symptoms just like yours , many times with no obvious heartburn at all.    It can be treated with medication, but also with lifestyle changes including elevation of the head of your bed (ideally with 6 inch  bed blocks),  Smoking cessation, avoidance of late meals, excessive alcohol, and avoid fatty foods, chocolate, peppermint, colas, red wine, and acidic juices such as orange juice.  NO MINT OR MENTHOL PRODUCTS SO NO COUGH DROPS  USE SUGARLESS CANDY INSTEAD (Jolley ranchers or Stover's or Life Savers) or even ice chips will also do - the key is to swallow to prevent all throat clearing. NO OIL BASED VITAMINS - use powdered substitutes.     Plan B = Backup Only use your nebulizer as a rescue medication to be used if you can't catch your breath by resting or doing a relaxed purse lip breathing pattern.  - The less you use it, the better it will work when you need it. - Ok to use up to once  every 4 hours if you must but call for appointment if use goes up over your usual need     Prednisone 10 mg take  4 each am x 2 days,   2 each am x 2 days,  1 each am x 2 days and stop    See Tammy NP w/in 2 weeks with all your medications flutter/ nebulizer solution, even over the counter meds, separated in two separate bags, the ones you take no matter what vs the ones you stop once  you feel better and take only as needed when you feel you need them.   Tammy  will generate for you a new user friendly medication calendar that will put [place] all on the same page re: your medication use.     Without this process, it simply isn't possible to assure that we are providing  your outpatient care  with  the attention to detail we feel you deserve.   If we cannot assure that you're getting that kind of care,  then we cannot manage your problem effectively from this clinic.  Once you have seen Tammy and we are sure that we're all on the same page with your medication use she will arrange follow up with me.

## 2017-05-26 ENCOUNTER — Ambulatory Visit: Payer: Medicare Other | Admitting: Internal Medicine

## 2017-06-09 ENCOUNTER — Ambulatory Visit (INDEPENDENT_AMBULATORY_CARE_PROVIDER_SITE_OTHER): Payer: Medicare Other | Admitting: Adult Health

## 2017-06-09 ENCOUNTER — Encounter: Payer: Self-pay | Admitting: Adult Health

## 2017-06-09 DIAGNOSIS — Z23 Encounter for immunization: Secondary | ICD-10-CM

## 2017-06-09 DIAGNOSIS — R058 Other specified cough: Secondary | ICD-10-CM

## 2017-06-09 DIAGNOSIS — J441 Chronic obstructive pulmonary disease with (acute) exacerbation: Secondary | ICD-10-CM

## 2017-06-09 DIAGNOSIS — R05 Cough: Secondary | ICD-10-CM | POA: Diagnosis not present

## 2017-06-09 MED ORDER — BUDESONIDE-FORMOTEROL FUMARATE 80-4.5 MCG/ACT IN AERO: INHALATION_SPRAY | RESPIRATORY_TRACT | 3 refills | Status: DC

## 2017-06-09 MED ORDER — BUDESONIDE-FORMOTEROL FUMARATE 80-4.5 MCG/ACT IN AERO: 2.0000 | INHALATION_SPRAY | Freq: Two times a day (BID) | RESPIRATORY_TRACT | 0 refills | Status: DC

## 2017-06-09 NOTE — Patient Instructions (Addendum)
Follow med calendar closely and bring to each visit.  Flu shot today  Continue on Symbicort 2 puffs Twice daily  , rinse after use.  Follow up with Dr. Melvyn Novas  In 3 months and As needed

## 2017-06-09 NOTE — Assessment & Plan Note (Signed)
Recent flare , now improving  Cont on cough control regimen and GERD prevention  Cont w/ flutter valve.

## 2017-06-09 NOTE — Progress Notes (Signed)
@Patient  ID: Shelley Perez, female    DOB: 04/30/1951, 66 y.o.   MRN: [id number]  Chief Complaint  Patient presents with  . Follow-up    Cough     Referring provider: Janith Lima, MD  HPI: 66 yo female former smoker followed for chronic cough , GOLD 0 COPD   TEST  ENT Eval 03/19/16 shoemaker: GERD only  PFT's  12/28/2015  FEV1 1.95 (91 % ) ratio 85  p 6 % improvement from saba p no rx prior to study with DLCO  85 % corrects to 106 % for alv volume   CT chest 10/2015 >Small area tree-in-bud opacity in the right middle lobe. Imaging features suggest atypical infection    06/09/2017 Follow up ; COPD GOLD 0/UACS  Patient returns for a follow-up visit. Last visit she was having a COPD flare. With a prednisone taper. Patient says she is feeling better. Cough has decreased. She remains on Symbicort twice daily. He denies any fever, chest pain, orthopnea, PND, or increased leg swelling.  We reviewed all her medications organize them into a medication calendar with patient education. Appears to be  taking her medications correctly   Allergies  Allergen Reactions  . Rosuvastatin Calcium     Other reaction(s): Other Muscle and joint pain  . Atorvastatin     Muscle and joint pain  . Codeine Nausea Only and Other (See Comments)    Stomach pains  . Crestor [Rosuvastatin]     Muscle and joint pain  . Prozac [Fluoxetine Hcl] Other (See Comments)    Changes her personality  . Latex Rash  . Sulfa Antibiotics Rash    Immunization History  Administered Date(s) Administered  . Influenza, High Dose Seasonal PF 06/05/2016, 06/09/2017  . Pneumococcal Conjugate-13 12/19/2016  . Pneumococcal Polysaccharide-23 05/14/2015  . Tdap 06/05/2016    Past Medical History:  Diagnosis Date  . Anemia   . Anxiety   . Arthritis   . Asthma   . Complication of anesthesia    "they gave me too much and I had to stay 3 days " 2005 after choley   . COPD (chronic obstructive pulmonary disease)  ([place])   . Depression   . Fibromyalgia   . History of kidney stones   . Hx of pancreatitis    with gallstones  . Hyperlipidemia   . Insomnia   . Osteoporosis     Tobacco History: History  Smoking Status  . Former Smoker  . Packs/day: 1.00  . Years: 40.00  . Types: Cigarettes  . Quit date: 06/28/2013  Smokeless Tobacco  . Never Used   Counseling given: Not Answered   Outpatient Encounter Prescriptions as of 06/09/2017  Medication Sig  . albuterol (PROVENTIL HFA;VENTOLIN HFA) 108 (90 Base) MCG/ACT inhaler Inhale 2 puffs into the lungs every 4 (four) hours as needed for wheezing or shortness of breath.  Marland Kitchen aspirin 81 MG tablet Take 81 mg by mouth daily.  . budesonide-formoterol (SYMBICORT) 80-4.5 MCG/ACT inhaler Take 2 puffs first thing in am and then another 2 puffs about 12 hours later.  . furosemide (LASIX) 20 MG tablet Take 1 tablet (20 mg total) by mouth 2 (two) times daily.  Marland Kitchen ipratropium-albuterol (DUONEB) 0.5-2.5 (3) MG/3ML SOLN Up to one every 4 hours as needed  . linaclotide (LINZESS) 145 MCG CAPS capsule Take 1 capsule (145 mcg total) by mouth daily before breakfast.  . potassium chloride (K-DUR) 10 MEQ tablet TAKE 1 TABLET(10 MEQ) BY MOUTH DAILY  .  sertraline (ZOLOFT) 100 MG tablet Take 1 tablet (100 mg total) by mouth daily.  . [DISCONTINUED] predniSONE (DELTASONE) 10 MG tablet Take  4 each am x 2 days,   2 each am x 2 days,  1 each am x 2 days and stop (Patient not taking: Reported on 06/09/2017)   No facility-administered encounter medications on file as of 06/09/2017.      Review of Systems  Constitutional:   No  weight loss, night sweats,  Fevers, chills, fatigue, or  lassitude.  HEENT:   No headaches,  Difficulty swallowing,  Tooth/dental problems, or  Sore throat,                No sneezing, itching, ear ache, nasal congestion, post nasal drip,   CV:  No chest pain,  Orthopnea, PND, swelling in lower extremities, anasarca, dizziness, palpitations,  syncope.   GI  No heartburn, indigestion, abdominal pain, nausea, vomiting, diarrhea, change in bowel habits, loss of appetite, bloody stools.   Resp:   No chest wall deformity  Skin: no rash or lesions.  GU: no dysuria, change in color of urine, no urgency or frequency.  No flank pain, no hematuria   MS:  No joint pain or swelling.  No decreased range of motion.  No back pain.    Physical Exam  BP 112/64 (BP Location: Left Arm, Cuff Size: Normal)   Pulse (!) 56   SpO2 99%   GEN: A/Ox3; pleasant , NAD, elderly   HEENT:  [place]/AT,  EACs-clear, TMs-wnl, NOSE-clear, THROAT-clear, no lesions, no postnasal drip or exudate noted.   NECK:  Supple w/ fair ROM; no JVD; normal carotid impulses w/o bruits; no thyromegaly or nodules palpated; no lymphadenopathy.    RESP  Clear  P & A; w/o, wheezes/ rales/ or rhonchi. no accessory muscle use, no dullness to percussion  CARD:  RRR, no m/r/g, trace peripheral edema, pulses intact, no cyanosis or clubbing.  GI:   Soft & nt; nml bowel sounds; no organomegaly or masses detected.   Musco: Warm bil, no deformities or joint swelling noted.   Neuro: alert, no focal deficits noted.    Skin: Warm, no lesions or rashes    Lab Results:   BMET   BNP No results found for: BNP  ProBNP No results found for: PROBNP  Imaging: No results found.   Assessment & Plan:   COPD with acute exacerbation ([place]) Recent exacerbation, now improving Patient's medications were reviewed today and patient education was given. Computerized medication calendar was adjusted/completed   Plan  Patient Instructions  Follow med calendar closely and bring to each visit.  Flu shot today  Continue on Symbicort 2 puffs Twice daily  , rinse after use.  Follow up with Dr. Melvyn Novas  In 3 months and As needed       Upper airway cough syndrome Recent flare , now improving  Cont on cough control regimen and GERD prevention  Cont w/ flutter valve.      Rexene Edison, NP 06/09/2017

## 2017-06-09 NOTE — Addendum Note (Signed)
Addended by: Parke Poisson E on: 06/09/2017 02:16 PM   Modules accepted: Orders

## 2017-06-09 NOTE — Assessment & Plan Note (Signed)
Recent exacerbation, now improving Patient's medications were reviewed today and patient education was given. Computerized medication calendar was adjusted/completed   Plan  Patient Instructions  Follow med calendar closely and bring to each visit.  Flu shot today  Continue on Symbicort 2 puffs Twice daily  , rinse after use.  Follow up with Dr. Melvyn Novas  In 3 months and As needed

## 2017-06-10 NOTE — Progress Notes (Signed)
Chart and office note reviewed in detail  > agree with a/p as outlined    

## 2017-06-16 NOTE — Addendum Note (Signed)
Addended by: Desmond Dike C on: 06/16/2017 03:16 PM   Modules accepted: Orders

## 2017-07-25 ENCOUNTER — Other Ambulatory Visit: Payer: Self-pay | Admitting: Internal Medicine

## 2017-08-26 ENCOUNTER — Ambulatory Visit: Payer: Medicare Other | Admitting: Family Medicine

## 2017-08-26 ENCOUNTER — Ambulatory Visit (INDEPENDENT_AMBULATORY_CARE_PROVIDER_SITE_OTHER): Payer: Medicare Other

## 2017-08-26 ENCOUNTER — Encounter: Payer: Self-pay | Admitting: Family Medicine

## 2017-08-26 VITALS — BP 135/72 | HR 71 | Wt 192.0 lb

## 2017-08-26 DIAGNOSIS — M79641 Pain in right hand: Secondary | ICD-10-CM

## 2017-08-26 DIAGNOSIS — M19041 Primary osteoarthritis, right hand: Secondary | ICD-10-CM | POA: Diagnosis not present

## 2017-08-26 MED ORDER — DICLOFENAC SODIUM 1 % TD GEL: 4.0000 g | Freq: Four times a day (QID) | TRANSDERMAL | 11 refills | Status: DC

## 2017-08-26 NOTE — Patient Instructions (Signed)
Thank you for coming in today. Get xray and labs today.  Use the diclofenac gel up to 4x daily.  If not better we will consider injection.

## 2017-08-26 NOTE — Progress Notes (Signed)
Shelley Perez is a 67 y.o. female who presents to [place] today for hand pain.  Shelley Perez has pains in the bilateral hands right worse than left.  She isolates the right 2,3, 4 PIP and Left 2,3 PIP as the worst.  She notes this is associated with swelling and stiffness.  She denies any recent injury.  The symptoms have been ongoing for about a week.  She is tried some Tylenol and heat which did not help much.  She is not sure if she ever has been diagnosed with rheumatoid arthritis.  No fevers or chills.   Past Medical History:  Diagnosis Date  . Anemia   . Anxiety   . Arthritis   . Asthma   . Complication of anesthesia    "they gave me too much and I had to stay 3 days " 2005 after choley   . COPD (chronic obstructive pulmonary disease) ([place])   . Depression   . Fibromyalgia   . History of kidney stones   . Hx of pancreatitis    with gallstones  . Hyperlipidemia   . Insomnia   . Osteoporosis    Past Surgical History:  Procedure Laterality Date  . c sections    . CHOLECYSTECTOMY    . DENTAL SURGERY    . FOOT SURGERY    . KNEE SURGERY  1978 / 1971  . SHOULDER SURGERY    . 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 Smoking status: Former Smoker    Packs/day: 1.00    Years: 40.00    Pack years: 40.00    Types: Cigarettes    Last attempt to quit: 06/28/2013    Years since quitting: 4.1  . Smokeless tobacco: Never Used  Substance Use Topics  . Alcohol use: No    Alcohol/week: 0.0 oz     ROS:  As above   Medications: Current Outpatient Medications  Medication Sig Dispense Refill  . aspirin 81 MG tablet Take 81 mg by mouth daily.    . budesonide-formoterol (SYMBICORT) 80-4.5 MCG/ACT inhaler Take 2 puffs first thing in am and then another 2 puffs about  12 hours later. 1 Inhaler 3  . dextromethorphan-guaiFENesin (MUCINEX DM) 30-600 MG 12hr tablet Take 1 tablet by mouth 2 (two) times daily as needed for cough.    . famotidine (PEPCID) 20 MG tablet Take 20 mg by mouth 2 (two) times daily.    . furosemide (LASIX) 20 MG tablet TAKE 1 TABLET(20 MG) BY MOUTH TWICE DAILY 60 tablet 0  . ipratropium-albuterol (DUONEB) 0.5-2.5 (3) MG/3ML SOLN Up to one every 4 hours as needed    . linaclotide (LINZESS) 145 MCG CAPS capsule Take 1 capsule (145 mcg total) by mouth daily before breakfast. 90 capsule 3  . Misc. Devices (ACAPELLA) MISC Use as needed.    . potassium chloride (K-DUR) 10 MEQ tablet TAKE 1 TABLET(10 MEQ) BY MOUTH DAILY 30 tablet 5  . sertraline (ZOLOFT) 100 MG tablet Take 1 tablet (100 mg total) by mouth daily. 90 tablet 3  . diclofenac sodium (VOLTAREN) 1 % GEL Apply 4 g topically 4 (four) times daily. To affected joint. 100 g 11   No current facility-administered medications for this visit.    Allergies  Allergen Reactions  . Rosuvastatin Calcium  Other reaction(s): Other Muscle and joint pain  . Atorvastatin     Muscle and joint pain  . Codeine Nausea Only and Other (See Comments)    Stomach pains  . Crestor [Rosuvastatin]     Muscle and joint pain  . Prozac [Fluoxetine Hcl] Other (See Comments)    Changes her personality  . Latex Rash  . Sulfa Antibiotics Rash     Exam:  BP 135/72   Pulse 71   Wt 192 lb (87.1 kg)   BMI 36.28 kg/m  General: Well Developed, well nourished, and in no acute distress.  Neuro/Psych: Alert and oriented x3, extra-ocular muscles intact, able to move all 4 extremities, sensation grossly intact. Skin: Warm and dry, no rashes noted.  Respiratory: Not using accessory muscles, speaking in full sentences, trachea midline.  Cardiovascular: Pulses palpable, no extremity edema. Abdomen: Does not appear distended. MSK:  Bilateral hand slightly swollen with some synovitis at the MCPs and more  predominantly at the [facility] throughout.  Decreased flexion range of motion. No severe ulnar deviation.  Left hand    No results found for this or any previous visit (from the past 48 hour(s)). Dg Hand Complete Right  Result Date: 08/26/2017 CLINICAL DATA:  Right hand pain EXAM: RIGHT HAND - COMPLETE 3+ VIEW COMPARISON:  None. FINDINGS: Negative for fracture. Osteoarthritis in the PIP and DIP joints of the second through fourth fingers. No erosion. MCP joints intact. Mild degenerative change in the wrist joint at the navicular triquetrum. Small bony fragment adjacent to the hamate may be due to old injury. 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 No acute abnormality. No erosions. Electronically Signed   By: Franchot Gallo M.D.   On: 08/26/2017 11:01      Assessment and Plan: 67 y.o. female with hand pain very likely osteoarthritis.  Rheumatoid arthritis is possible.  We discussed options.  Plan for a rheumatologic workup listed below and trial of conservative management at home as well as diclofenac gel.  If not better will proceed with an injection.    Orders Placed This Encounter  Procedures  . DG Hand Complete Right    Standing Status:   Future    Number of Occurrences:   1    Standing Expiration Date:   10/25/2018    Order Specific Question:   Reason for Exam (SYMPTOM  OR DIAGNOSIS REQUIRED)    Answer:   eval PIP and eval > RA?    Order Specific Question:   Preferred imaging location?    Answer:   [facility]    Order Specific Question:   Radiology Contrast Protocol - do NOT remove file path    Answer:   [email or web address]  . Cyclic citrul peptide antibody, IgG  . CBC with Differential/Platelet  . Comprehensive metabolic panel  . Sedimentation rate  . Rheumatoid factor  . CK  . ANA   Meds ordered this encounter  Medications  . diclofenac sodium (VOLTAREN) 1 % GEL    Sig: Apply 4 g topically 4 (four)  times daily. To affected joint.    Dispense:  100 g    Refill:  11    Discussed warning signs or symptoms. Please see discharge instructions. Patient expresses understanding.

## 2017-08-27 LAB — CBC WITH DIFFERENTIAL/PLATELET
BASOS ABS: 96 {cells}/uL (ref 0–200)
BASOS PCT: 1.3 %
Eosinophils Absolute: 340 cells/uL (ref 15–500)
Eosinophils Relative: 4.6 %
HEMATOCRIT: 36.7 % (ref 35.0–45.0)
HEMOGLOBIN: 12.8 g/dL (ref 11.7–15.5)
LYMPHS ABS: 2420 {cells}/uL (ref 850–3900)
MCH: 30 pg (ref 27.0–33.0)
MCHC: 34.9 g/dL (ref 32.0–36.0)
MCV: 86.2 fL (ref 80.0–100.0)
MONOS PCT: 8.9 %
MPV: 9.8 fL (ref 7.5–12.5)
NEUTROS ABS: 3885 {cells}/uL (ref 1500–7800)
Neutrophils Relative %: 52.5 %
Platelets: 501 10*3/uL — ABNORMAL HIGH (ref 140–400)
RBC: 4.26 10*6/uL (ref 3.80–5.10)
RDW: 11.7 % (ref 11.0–15.0)
Total Lymphocyte: 32.7 %
WBC: 7.4 10*3/uL (ref 3.8–10.8)
WBCMIX: 659 {cells}/uL (ref 200–950)

## 2017-08-27 LAB — COMPREHENSIVE METABOLIC PANEL
AG RATIO: 1.5 (calc) (ref 1.0–2.5)
ALT: 12 U/L (ref 6–29)
AST: 13 U/L (ref 10–35)
Albumin: 4.1 g/dL (ref 3.6–5.1)
Alkaline phosphatase (APISO): 91 U/L (ref 33–130)
BILIRUBIN TOTAL: 0.4 mg/dL (ref 0.2–1.2)
BUN / CREAT RATIO: 21 (calc) (ref 6–22)
BUN: 21 mg/dL (ref 7–25)
CALCIUM: 9.5 mg/dL (ref 8.6–10.4)
CHLORIDE: 103 mmol/L (ref 98–110)
CO2: 29 mmol/L (ref 20–32)
Creat: 1 mg/dL — ABNORMAL HIGH (ref 0.50–0.99)
GLOBULIN: 2.7 g/dL (ref 1.9–3.7)
GLUCOSE: 90 mg/dL (ref 65–139)
Potassium: 4.2 mmol/L (ref 3.5–5.3)
SODIUM: 139 mmol/L (ref 135–146)
Total Protein: 6.8 g/dL (ref 6.1–8.1)

## 2017-08-27 LAB — ANA: Anti Nuclear Antibody(ANA): POSITIVE — AB

## 2017-08-27 LAB — CYCLIC CITRUL PEPTIDE ANTIBODY, IGG: Cyclic Citrullin Peptide Ab: <16 UNITS

## 2017-08-27 LAB — RHEUMATOID FACTOR: Rhuematoid fact SerPl-aCnc: <14 IU/mL (ref <14)

## 2017-08-27 LAB — SEDIMENTATION RATE: SED RATE: 28 mm/h (ref 0–30)

## 2017-08-27 LAB — ANTI-NUCLEAR AB-TITER (ANA TITER)

## 2017-08-27 LAB — CK: Total CK: 48 U/L (ref 29–143)

## 2017-08-31 ENCOUNTER — Encounter: Payer: Self-pay | Admitting: Family Medicine

## 2017-08-31 ENCOUNTER — Ambulatory Visit (INDEPENDENT_AMBULATORY_CARE_PROVIDER_SITE_OTHER): Payer: Medicare Other | Admitting: Family Medicine

## 2017-08-31 VITALS — BP 130/81 | HR 69 | Ht 61.0 in | Wt 191.0 lb

## 2017-08-31 DIAGNOSIS — M7989 Other specified soft tissue disorders: Secondary | ICD-10-CM | POA: Diagnosis not present

## 2017-08-31 MED ORDER — CELECOXIB 100 MG PO CAPS: 100.0000 mg | ORAL_CAPSULE | Freq: Two times a day (BID) | ORAL | 1 refills | Status: DC

## 2017-08-31 NOTE — Progress Notes (Signed)
1 

## 2017-08-31 NOTE — Patient Instructions (Signed)
Thank you for coming in today. Add Celebrex twice daily.  Follow up with Rheumatology.  If not getting better with celebrex I can do a shot anytime.  Return sooner if needed.   Celecoxib capsules What is this medicine? CELECOXIB (sell a KOX ib) is a non-steroidal anti-inflammatory drug (NSAID). This medicine is used to treat arthritis and ankylosing spondylitis. It may be also used for pain or painful monthly periods. This medicine may be used for other purposes; ask your health care provider or pharmacist if you have questions. COMMON BRAND NAME(S): Celebrex What should I tell my health care provider before I take this medicine? They need to know if you have any of these conditions: -asthma -coronary artery bypass graft (CABG) surgery within the past 2 weeks -drink more than 3 alcohol-containing drinks a day -heart disease or circulation problems like heart failure or leg edema (fluid retention) -high blood pressure -kidney disease -liver disease -stomach bleeding or ulcers -an unusual or allergic reaction to celecoxib, sulfa drugs, aspirin, other NSAIDs, other medicines, foods, dyes, or preservatives -pregnant or trying to get pregnant -breast-feeding How should I use this medicine? Take this medicine by mouth with a full glass of water. Follow the directions on the prescription label. Take it with food if it upsets your stomach or if you take 400 mg at one time. Try to not lie down for at least 10 minutes after you take the medicine. Take the medicine at the same time each day. Do not take more medicine than you are told to take. Long-term, continuous use may increase the risk of heart attack or stroke. A special MedGuide will be given to you by the pharmacist with each prescription and refill. Be sure to read this information carefully each time. Talk to your pediatrician regarding the use of this medicine in children. Special care may be needed. Overdosage: If you think you have  taken too much of this medicine contact a poison control center or emergency room at once. NOTE: This medicine is only for you. Do not share this medicine with others. What if I miss a dose? If you miss a dose, take it as soon as you can. If it is almost time for your next dose, take only that dose. Do not take double or extra doses. What may interact with this medicine? Do not take this medicine with any of the following medications: -cidofovir -methotrexate -other NSAIDs, medicines for pain and inflammation, like ibuprofen or naproxen -pemetrexed This medicine may also interact with the following medications: -alcohol -aspirin and aspirin-like drugs -diuretics -fluconazole -lithium -medicines for high blood pressure -steroid medicines like prednisone or cortisone -warfarin This list may not describe all possible interactions. Give your health care provider a list of all the medicines, herbs, non-prescription drugs, or dietary supplements you use. Also tell them if you smoke, drink alcohol, or use illegal drugs. Some items may interact with your medicine. What should I watch for while using this medicine? Tell your doctor or health care professional if your pain does not get better. Talk to your doctor before taking another medicine for pain. Do not treat yourself. This medicine does not prevent heart attack or stroke. In fact, this medicine may increase the chance of a heart attack or stroke. The chance may increase with longer use of this medicine and in people who have heart disease. If you take aspirin to prevent heart attack or stroke, talk with your doctor or health care professional. Do not take medicines  such as ibuprofen and naproxen with this medicine. Side effects such as stomach upset, nausea, or ulcers may be more likely to occur. Many medicines available without a prescription should not be taken with this medicine. This medicine can cause ulcers and bleeding in the stomach and  intestines at any time during treatment. Ulcers and bleeding can happen without warning symptoms and can cause death. What side effects may I notice from receiving this medicine? Side effects that you should report to your doctor or health care professional as soon as possible: -allergic reactions like skin rash, itching or hives, swelling of the face, lips, or tongue -black or bloody stools, blood in the urine or vomit -blurred vision -breathing problems -chest pain -nausea, vomiting -problems with balance, talking, walking -redness, blistering, peeling or loosening of the skin, including inside the mouth -unexplained weight gain or swelling -unusually weak or tired -yellowing of eyes, skin Side effects that usually do not require medical attention (report to your doctor or health care professional if they continue or are bothersome): -constipation or diarrhea -dizziness -gas or heartburn -upset stomach This list may not describe all possible side effects. Call your doctor for medical advice about side effects. You may report side effects to FDA at 1-800-FDA-1088. Where should I keep my medicine? Keep out of the reach of children. Store at room temperature between 15 and 30 degrees C (59 and 86 degrees F). Keep container tightly closed. Throw away any unused medicine after the expiration date. NOTE: This sheet is a summary. It may not cover all possible information. If you have questions about this medicine, talk to your doctor, pharmacist, or health care provider.  2018 Elsevier/Gold Standard (2009-10-03 10:54:17)

## 2017-08-31 NOTE — Progress Notes (Signed)
Shelley Perez is a 67 y.o. female who presents to [place] [place] today for hand pain.  Ms. Zabawa was seen recently for hand pain and had a rheumatologic workup.  X-rays did not show erosions but did show some mild changes consistent with osteoarthritis.  She had an extensive rheumatologic workup which was only positive for an ANA which was positive with a titer of 1:80 with a nucleolar pattern.  Her other labs were unremarkable.  She had a trial of diclofenac gel which has helped some.  She notes in the past she did well with Celebrex and would like to try that.  She would like to avoid injections if possible.   Past Medical History:  Diagnosis Date  . Anemia   . Anxiety   . Arthritis   . Asthma   . Complication of anesthesia    "they gave me too much and I had to stay 3 days " 2005 after choley   . COPD (chronic obstructive pulmonary disease) ([place])   . Depression   . Fibromyalgia   . History of kidney stones   . Hx of pancreatitis    with gallstones  . Hyperlipidemia   . Insomnia   . Osteoporosis    Past Surgical History:  Procedure Laterality Date  . c sections    . CHOLECYSTECTOMY    . DENTAL SURGERY    . FOOT SURGERY    . KNEE SURGERY  1978 / 1971  . SHOULDER SURGERY    . 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 Smoking status: Former Smoker    Packs/day: 1.00    Years: 40.00    Pack years: 40.00    Types: Cigarettes    Last attempt to quit: 06/28/2013    Years since quitting: 4.1  . Smokeless tobacco: Never Used  Substance Use Topics  . Alcohol use: No    Alcohol/week: 0.0 oz   family history includes Asthma in her sister; Cancer in her father; Heart attack in her mother;  Hyperlipidemia in her mother; Hypertension in her mother; Lung cancer in her sister.  ROS as above:  Medications: Current Outpatient Medications  Medication Sig Dispense Refill  . aspirin 81 MG tablet Take 81 mg by mouth daily.    . budesonide-formoterol (SYMBICORT) 80-4.5 MCG/ACT inhaler Take 2 puffs first thing in am and then another 2 puffs about 12 hours later. 1 Inhaler 3  . dextromethorphan-guaiFENesin (MUCINEX DM) 30-600 MG 12hr tablet Take 1 tablet by mouth 2 (two) times daily as needed for cough.    . diclofenac sodium (VOLTAREN) 1 % GEL Apply 4 g topically 4 (four) times daily. To affected joint. 100 g 11  . famotidine (PEPCID) 20 MG tablet Take 20 mg by mouth 2 (two) times daily.    . furosemide (LASIX) 20 MG tablet TAKE 1 TABLET(20 MG) BY MOUTH TWICE DAILY 60 tablet 0  . ipratropium-albuterol (DUONEB) 0.5-2.5 (3) MG/3ML SOLN Up to one every 4 hours as needed    . linaclotide (LINZESS) 145 MCG CAPS capsule Take 1 capsule (145 mcg total) by mouth daily before breakfast. 90 capsule 3  . Misc. Devices (ACAPELLA) MISC Use as needed.    . potassium chloride (K-DUR) 10 MEQ tablet TAKE 1 TABLET(10 MEQ) BY MOUTH  DAILY 30 tablet 5  . sertraline (ZOLOFT) 100 MG tablet Take 1 tablet (100 mg total) by mouth daily. 90 tablet 3  . celecoxib (CELEBREX) 100 MG capsule Take 1 capsule (100 mg total) by mouth 2 (two) times daily. 60 capsule 1   No current facility-administered medications for this visit.    Allergies  Allergen Reactions  . Rosuvastatin Calcium     Other reaction(s): Other Muscle and joint pain  . Atorvastatin     Muscle and joint pain  . Codeine Nausea Only and Other (See Comments)    Stomach pains  . Crestor [Rosuvastatin]     Muscle and joint pain  . Prozac [Fluoxetine Hcl] Other (See Comments)    Changes her personality  . Latex Rash  . Sulfa Antibiotics Rash    Health Maintenance Health Maintenance  Topic Date Due  . MAMMOGRAM  05/03/2017  . Fecal DNA  (Cologuard)  03/21/2019  . PNA vac Low Risk Adult (2 of 2 - PPSV23) 05/13/2020  . TETANUS/TDAP  06/05/2026  . INFLUENZA VACCINE  Completed  . DEXA SCAN  Completed  . Hepatitis C Screening  Completed     Exam:  BP 130/81   Pulse 69   Ht 5\' 1"  (1.549 m)   Wt 191 lb (86.6 kg)   BMI 36.09 kg/m  Gen: Well NAD Hands bilaterally are still swollen at the PIPs especially at the PIP 2, 3, and 4 left worse than right.  Decreased flexion left worse than right present.  Lab Results  Component Value Date   ANA POSITIVE (A) 08/26/2017      No results found for this or any previous visit (from the past 72 hour(s)). No results found.    Assessment and Plan: 67 y.o. female with hand pain likely due to synovitis possibly due to osteoarthritis.  I am somewhat concerned for rheumatologic process based on her physical exam and laboratory findings.  After discussion I think it is reasonable to refer to rheumatology at this point.  Additionally will continue to treat with Celebrex.  She may return if not better for ultrasound-guided injections as needed.   Orders Placed This Encounter  Procedures  . Ambulatory referral to Rheumatology    Referral Priority:   Routine    Referral Type:   Consultation    Referral Reason:   Specialty Services Required    Requested Specialty:   Rheumatology    Number of Visits Requested:   1   Meds ordered this encounter  Medications  . celecoxib (CELEBREX) 100 MG capsule    Sig: Take 1 capsule (100 mg total) by mouth 2 (two) times daily.    Dispense:  60 capsule    Refill:  1     Discussed warning signs or symptoms. Please see discharge instructions. Patient expresses understanding.  I spent 25 minutes with this patient, greater than 50% was face-to-face time counseling regarding ddx and treatment plan.

## 2017-09-10 ENCOUNTER — Ambulatory Visit: Payer: Medicare Other | Admitting: Internal Medicine

## 2017-09-10 ENCOUNTER — Encounter: Payer: Self-pay | Admitting: Internal Medicine

## 2017-09-10 ENCOUNTER — Ambulatory Visit (INDEPENDENT_AMBULATORY_CARE_PROVIDER_SITE_OTHER): Payer: Medicare Other

## 2017-09-10 ENCOUNTER — Other Ambulatory Visit (INDEPENDENT_AMBULATORY_CARE_PROVIDER_SITE_OTHER): Payer: Medicare Other

## 2017-09-10 VITALS — BP 140/76 | HR 57 | Ht 61.0 in | Wt 190.8 lb

## 2017-09-10 DIAGNOSIS — J441 Chronic obstructive pulmonary disease with (acute) exacerbation: Secondary | ICD-10-CM | POA: Diagnosis not present

## 2017-09-10 DIAGNOSIS — R05 Cough: Secondary | ICD-10-CM

## 2017-09-10 DIAGNOSIS — R058 Other specified cough: Secondary | ICD-10-CM

## 2017-09-10 DIAGNOSIS — R059 Cough, unspecified: Secondary | ICD-10-CM

## 2017-09-10 LAB — CBC WITH DIFFERENTIAL/PLATELET
BASOS ABS: 0.1 10*3/uL (ref 0.0–0.1)
Basophils Relative: 1.1 % (ref 0.0–3.0)
EOS ABS: 0.3 10*3/uL (ref 0.0–0.7)
Eosinophils Relative: 3.8 % (ref 0.0–5.0)
HCT: 38.6 % (ref 36.0–46.0)
Hemoglobin: 12.9 g/dL (ref 12.0–15.0)
LYMPHS ABS: 2.6 10*3/uL (ref 0.7–4.0)
Lymphocytes Relative: 33.3 % (ref 12.0–46.0)
MCHC: 33.5 g/dL (ref 30.0–36.0)
MCV: 88 fl (ref 78.0–100.0)
MONO ABS: 0.7 10*3/uL (ref 0.1–1.0)
Monocytes Relative: 8.5 % (ref 3.0–12.0)
NEUTROS PCT: 53.3 % (ref 43.0–77.0)
Neutro Abs: 4.2 10*3/uL (ref 1.4–7.7)
Platelets: 474 10*3/uL — ABNORMAL HIGH (ref 150.0–400.0)
RBC: 4.38 Mil/uL (ref 3.87–5.11)
RDW: 12.8 % (ref 11.5–15.5)
WBC: 7.8 10*3/uL (ref 4.0–10.5)

## 2017-09-10 NOTE — Progress Notes (Signed)
Subjective:    Patient ID: Shelley Perez, female    DOB: 1951-04-21,   MRN: [id number]    Brief patient profile:  51 yowf quit smoking 2015 with some sensation of midline chest discomfort brought on by smoking that resolved @ 172lb p quit smoking with no breathing problems though tendency for colds going to chest and persistent sore throat since ET 11/22/116 for R knee then  worst episode middle of Feb 2017 >  sev ov's rx as flu with tamiflu / pred/ abx/ only some better so referred to pulmonary clinic 11/22/2015 by Dr  Philis [facility]endt [facility]   History of Present Illness  11/22/2015 1st [facility] office visit/ Zafar Debrosse   Chief Complaint  Patient presents with  . Pulmonary Consult    Referred by Dr. Philis Fendt. Pt c/o cough and SOB since she was dxed with Influenza in Feb 2017. Cough is prod with yellow sputum.    rx with doxy and prednisone x 4 days and qvar not better. Cough is worse at hs and in am and doe x more than slow walk on flat level = MMRC2 Having to use neb saba qid whereas prior to feb 2017 was not using it at all and prev on no maint rx  rec Plan A = Automatic =  symbicort 160 Take 2 puffs first thing in am and then another 2 puffs about 12 hours later.  Plan B = Backup  ok to use the nebulizer up to every 4 hours but if start needing it regularly call for immediate appointment GERD diet      12/28/2015  f/u ov/Anson Peddie re: GOLD 0 copd/ mild AB/uacs Chief Complaint  Patient presents with  . Follow-up    Breathing has improved. She has not had to use albuterol inhaler or neb.   can't take ppi regularly due to joints aching  Not taking pepcid at all  rec You do have significant copd and unlikely you ever will  Continue symbicort 80 Take 2 puffs first thing in am and then another 2 puffs about 12 hours later if you think you need it.  Try pepcid ac 20 mg after bfast and after supper  GERD diet     05/21/2017  f/u ov/Creed Kail re:  GOLD 0 copd/ AB with prob UACS  Chief  Complaint  Patient presents with  . Hospitalization Follow-up    Pt in hospital 05/13/17-05/15/17 due to COPD. States that she is still coughing with yellow to white mucus and c/o SOB all the time that is worse early in the morning and at night. Pt states that she is sore in the chest due to being hospitalized.  after last visit did fine on symbicort 80 2pffs daily but stopped it after several months but still had trouble had trouble with mowing grass due to cough/ sob and did not call as better when avoided exposure  [place] since late summer  2018 with recurrent cough/sob  eval by Ronnald Ramp 04/28/17 and no better p omnicef and prednisone  admtted 9/26-28/ 2018  [place] with dx "aecopd" rx with steroids and duoneb and better but not back to baseline ex tol and "never want to get that bad again. / not on any gerd rx  Still with coughing fits/ quite harsh with obvious pseudowheeze  And can't sleep flat due to cough and chest gen sore ant with cough rec For cough > mucinex dm up to 1200 mg every 12 hours with flutter valve as much  as possible  And if still coughing then use your cough syrup as needed   Plan A = Automatic = symbicort  80 Take 2 puffs first thing in am and then another 2 puffs about 12 hours later.  Pecpid 20 mg after bfast and supper  Work on inhaler technique:   GERD   Plan B = Backup Only use your nebulizer as a rescue medication  Prednisone 10 mg take  4 each am x 2 days,   2 each am x 2 days,  1 each am x 2 days and stop      09/10/2017  f/u ov/Jackline Castilla re:  Cough flare since summer of 2018  variant asthma vs uacs  Chief Complaint  Patient presents with  . Follow-up    Breathing is doing well and she has not needed her neb. No new co's.   main issue is nasal obst at hs, not using  Any not nasal sprays / not using bed blocks  Overt hb symptoms with mild orophanygeal sense of dysphagia  Not limited by breathing from desired activities    No obvious day to day or daytime  variability or assoc excess/ purulent sputum or mucus plugs or hemoptysis or cp or chest tightness, subjective wheeze . No unusual exposure hx or h/o childhood pna/ asthma or knowledge of premature birth.  Sleeping ok flat without nocturnal  or early am exacerbation  of respiratory  c/o's or need for noct saba. Also denies any obvious fluctuation of symptoms with weather or environmental changes or other aggravating or alleviating factors except as outlined above   Current Allergies, Complete Past Medical History, Past Surgical History, Family History, and Social History were reviewed in [employer] record.  ROS  The following are not active complaints unless bolded Hoarseness, sore throat, dysphagia, dental problems, itching, sneezing,  nasal congestion or discharge of excess mucus or purulent secretions, ear ache,   fever, chills, sweats, unintended wt loss or wt gain, classically pleuritic or exertional cp,  orthopnea pnd or leg swelling, presyncope, palpitations, abdominal pain, anorexia, nausea, vomiting, diarrhea  or change in bowel habits or change in bladder habits, change in stools or change in urine, dysuria, hematuria,  rash, arthralgias, visual complaints, headache, numbness, weakness or ataxia or problems with walking or coordination,  change in mood/affect or memory.        Current Meds  Medication Sig  . aspirin 81 MG tablet Take 81 mg by mouth daily.  . budesonide-formoterol (SYMBICORT) 80-4.5 MCG/ACT inhaler Take 2 puffs first thing in am and then another 2 puffs about 12 hours later.  . celecoxib (CELEBREX) 100 MG capsule Take 1 capsule (100 mg total) by mouth 2 (two) times daily.  . diclofenac sodium (VOLTAREN) 1 % GEL Apply 1 application topically 4 (four) times daily as needed.  . famotidine (PEPCID) 20 MG tablet Take 20 mg by mouth 2 (two) times daily.  . furosemide (LASIX) 20 MG tablet TAKE 1 TABLET(20 MG) BY MOUTH TWICE DAILY  . ipratropium-albuterol  (DUONEB) 0.5-2.5 (3) MG/3ML SOLN Up to one every 4 hours as needed  . linaclotide (LINZESS) 145 MCG CAPS capsule Take 1 capsule (145 mcg total) by mouth daily before breakfast. (Patient taking differently: Take 145 mcg by mouth daily as needed. )  . Misc. Devices (ACAPELLA) MISC Use as needed.  . Pseudoephedrine-Guaifenesin (MUCINEX D MAX STRENGTH) [phone] MG TB12 Take 1 tablet by mouth 2 (two) times daily as needed.  . sertraline (ZOLOFT) 100  MG tablet Take 1 tablet (100 mg total) by mouth daily.  . [DISCONTINUED] diclofenac sodium (VOLTAREN) 1 % GEL Apply 4 g topically 4 (four) times daily. To affected joint. (Patient taking differently: Apply 4 g topically 4 (four) times daily as needed. To affected joint.)  . [DISCONTINUED] potassium chloride (K-DUR) 10 MEQ tablet TAKE 1 TABLET(10 MEQ) BY MOUTH DAILY                     Objective:   Physical Exam   elderly wf  No longer with any  coughing fits     09/10/2017       191  05/21/2017       185  12/28/2015       187  12/11/2015        187   11/22/15 191 lb 12.8 oz (87 kg)  11/11/15 184 lb 12.8 oz (83.825 kg)  11/10/15 187 lb (84.823 kg)     Vital signs reviewed - Note on arrival 02 sats  96% on RA         HEENT: nl dentition,  bilaterally, and oropharynx. Nl external ear canals without cough reflex - moderate bilateral non-specific turbinate edema    NECK :  without JVD/Nodes/TM/ nl carotid upstrokes bilaterally   LUNGS: no acc muscle use,  Nl contour chest which is clear to A and P bilaterally without cough on insp or exp maneuvers   CV:  RRR  no s3 or murmur or increase in P2, and no edema   ABD:  soft and nontender with nl inspiratory excursion in the supine position. No bruits or organomegaly appreciated, bowel sounds nl  MS:  Nl gait/ ext warm without deformities, calf tenderness, cyanosis or clubbing No obvious joint restrictions   SKIN: warm and dry without lesions    NEURO:  alert, approp, nl sensorium with  no  motor or cerebellar deficits apparent.     Labs ordered 09/10/2017  Allergy profile    Assessment & Plan:

## 2017-09-10 NOTE — Patient Instructions (Addendum)
Continue Plan A = Automatic = symbicort  80 Take 2 puffs first thing in am and then another 2 puffs about 12 hours later.  Pecpid 20 mg after bfast and supper   Work on Technical brewer inhaler technique:  relax and gently blow all the way out then take a nice smooth deep breath back in, triggering the inhaler at same time you start breathing in.  Hold for up to 5 seconds if you can. Blow out thru nose. Rinse and gargle with water when done  GERD (REFLUX)  is an extremely common cause of respiratory symptoms just like yours , many times with no obvious heartburn at all.    It can be treated with medication, but also with lifestyle changes including elevation of the head of your bed (ideally with 6 inch  bed blocks),  Smoking cessation, avoidance of late meals, excessive alcohol, and avoid fatty foods, chocolate, peppermint, colas, red wine, and acidic juices such as orange juice.  NO MINT OR MENTHOL PRODUCTS SO NO COUGH DROPS   USE SUGARLESS CANDY INSTEAD (Jolley ranchers or Stover's or Life Savers) or even ice chips will also do - the key is to swallow to prevent all throat clearing. NO OIL BASED VITAMINS - use powdered substitutes.     Plan B = Backup Only use your nebulizer as a rescue medication to be used if you can't catch your breath by resting or doing a relaxed purse lip breathing pattern.  - The less you use it, the better it will work when you need it. - Ok to use up to once  every 4 hours if you must but call for appointment if use goes up over your usual need  For cough > mucinex DM  1200 mg every 12 hours and use the flutter valve as much as possible   For nasal congestion >  mucinex D as per bottle  Please see patient coordinator before you leave today  to schedule sinus CT   Please remember to go to the lab department downstairs in the basement  for your tests - we will call you with the results when they are available.      Please schedule a follow up visit in 3 months but  call sooner if needed  - add ent eval next step

## 2017-09-11 ENCOUNTER — Other Ambulatory Visit: Payer: Self-pay | Admitting: Internal Medicine

## 2017-09-11 LAB — RESPIRATORY ALLERGY PROFILE REGION II ~~LOC~~
Allergen, A. alternata, m6: <0.1 kU/L
Allergen, Comm Silver Birch, t9: <0.1 kU/L
Allergen, Cottonwood, t14: <0.1 kU/L
Allergen, P. notatum, m1: <0.1 kU/L
Bermuda Grass: <0.1 kU/L
Box Elder IgE: <0.1 kU/L
CLADOSPORIUM HERBARUM (M2) IGE: <0.1 kU/L
CLASS: 0
CLASS: 0
CLASS: 0
CLASS: 0
CLASS: 0
CLASS: 0
CLASS: 0
CLASS: 0
CLASS: 0
CLASS: 0
CLASS: 0
CLASS: 0
CLASS: 0
CLASS: 0
COMMON RAGWEED (SHORT) (W1) IGE: <0.1 kU/L
Class: 0
Class: 0
Class: 0
Class: 0
Class: 0
Class: 0
Class: 0
Class: 0
Class: 0
Class: 0
Cockroach: <0.1 kU/L
D. farinae: <0.1 kU/L
Dog Dander: <0.1 kU/L
Elm IgE: <0.1 kU/L
IgE (Immunoglobulin E), Serum: 19 kU/L (ref <=114)
Johnson Grass: <0.1 kU/L
Pecan/Hickory Tree IgE: <0.1 kU/L

## 2017-09-11 LAB — INTERPRETATION:

## 2017-09-11 NOTE — Progress Notes (Signed)
Spoke with pt and notified of results per Dr. Wert. Pt verbalized understanding and denied any questions. 

## 2017-09-13 ENCOUNTER — Encounter: Payer: Self-pay | Admitting: Internal Medicine

## 2017-09-13 NOTE — Assessment & Plan Note (Signed)
11/22/2015   try symbicort 160 2bid  - 12/11/2015   try reducing to symb 80 2bid  - PFT's  12/28/2015  FEV1 1.95 (91 % ) ratio 85  p 6 % improvement from saba p no rx prior to study with DLCO  85 % corrects to 106 % for alv volume    - 09/10/2017  After extensive coaching inhaler device  effectiveness =    75%

## 2017-09-13 NOTE — Assessment & Plan Note (Signed)
S/p ET placement for knee surgery 06/2015  - ENT Eval 03/19/16 shoemaker: GERD only  - Flare since 03/2017 partially responsive to steroids/ duoneb - added flutter valve 05/21/2017 and resumed bid h2   - Allergy profile 09/10/2017 >  Eos 0.3 /  IgE  19 RAST neg - Sinus CT 09/10/2017  : No sinusitis.   Clearly better than she was but has persistent sensation of pnds/ throat tickle c/w irritably larynx syndrome but most of her symptoms are nasal at this point and might benefit from ent eval before considering trial of gabapentin

## 2017-09-14 ENCOUNTER — Telehealth: Payer: Self-pay | Admitting: *Deleted

## 2017-09-14 NOTE — Telephone Encounter (Signed)
-----   Message from Tanda Rockers, MD sent at 09/13/2017  6:46 AM EST ----- Refer to [facility]emakers group for evaluation of nasal obst symptoms if not better by now as no obvious allergy or sinus ct findings

## 2017-09-28 NOTE — Telephone Encounter (Signed)
Spoke with the pt and notified of recs per MW  She states she is not having the nasal issues any longer  Nothing further needed

## 2017-10-09 ENCOUNTER — Other Ambulatory Visit: Payer: Self-pay | Admitting: Internal Medicine

## 2017-10-09 NOTE — Telephone Encounter (Signed)
Pt is due for 6 month follow up in February.  Can you contact pt to make an appt.

## 2017-10-13 ENCOUNTER — Other Ambulatory Visit: Payer: Self-pay | Admitting: Internal Medicine

## 2017-10-13 DIAGNOSIS — M2550 Pain in unspecified joint: Secondary | ICD-10-CM | POA: Diagnosis not present

## 2017-10-13 DIAGNOSIS — M7989 Other specified soft tissue disorders: Secondary | ICD-10-CM | POA: Diagnosis not present

## 2017-10-13 DIAGNOSIS — I10 Essential (primary) hypertension: Secondary | ICD-10-CM

## 2017-10-13 MED ORDER — POTASSIUM CHLORIDE ER 10 MEQ PO TBCR: EXTENDED_RELEASE_TABLET | ORAL | 2 refills | Status: DC

## 2017-10-13 MED ORDER — FUROSEMIDE 20 MG PO TABS: ORAL_TABLET | ORAL | 2 refills | Status: DC

## 2017-10-13 NOTE — Telephone Encounter (Signed)
Appt made

## 2017-10-15 ENCOUNTER — Ambulatory Visit (INDEPENDENT_AMBULATORY_CARE_PROVIDER_SITE_OTHER): Payer: Medicare Other | Admitting: Internal Medicine

## 2017-10-15 ENCOUNTER — Other Ambulatory Visit (INDEPENDENT_AMBULATORY_CARE_PROVIDER_SITE_OTHER): Payer: Medicare Other

## 2017-10-15 ENCOUNTER — Encounter: Payer: Self-pay | Admitting: Internal Medicine

## 2017-10-15 VITALS — BP 128/62 | HR 71 | Temp 98.5°F | Resp 16 | Ht 61.0 in | Wt 192.0 lb

## 2017-10-15 DIAGNOSIS — D75839 Thrombocytosis, unspecified: Secondary | ICD-10-CM

## 2017-10-15 DIAGNOSIS — E559 Vitamin D deficiency, unspecified: Secondary | ICD-10-CM | POA: Diagnosis not present

## 2017-10-15 DIAGNOSIS — Z1239 Encounter for other screening for malignant neoplasm of breast: Secondary | ICD-10-CM

## 2017-10-15 DIAGNOSIS — D473 Essential (hemorrhagic) thrombocythemia: Secondary | ICD-10-CM

## 2017-10-15 LAB — CBC WITH DIFFERENTIAL/PLATELET
BASOS PCT: 1 % (ref 0.0–3.0)
Basophils Absolute: 0.1 10*3/uL (ref 0.0–0.1)
EOS PCT: 5.3 % — AB (ref 0.0–5.0)
Eosinophils Absolute: 0.4 10*3/uL (ref 0.0–0.7)
HCT: 38.1 % (ref 36.0–46.0)
Hemoglobin: 12.6 g/dL (ref 12.0–15.0)
LYMPHS ABS: 2.4 10*3/uL (ref 0.7–4.0)
Lymphocytes Relative: 31.9 % (ref 12.0–46.0)
MCHC: 33.1 g/dL (ref 30.0–36.0)
MCV: 87.4 fl (ref 78.0–100.0)
MONO ABS: 0.6 10*3/uL (ref 0.1–1.0)
Monocytes Relative: 8.6 % (ref 3.0–12.0)
NEUTROS PCT: 53.2 % (ref 43.0–77.0)
Neutro Abs: 4 10*3/uL (ref 1.4–7.7)
Platelets: 470 10*3/uL — ABNORMAL HIGH (ref 150.0–400.0)
RBC: 4.36 Mil/uL (ref 3.87–5.11)
RDW: 13.5 % (ref 11.5–15.5)
WBC: 7.4 10*3/uL (ref 4.0–10.5)

## 2017-10-15 LAB — VITAMIN B12: VITAMIN B 12: 318 pg/mL (ref 211–911)

## 2017-10-15 LAB — VITAMIN D 25 HYDROXY (VIT D DEFICIENCY, FRACTURES): VITD: 24.74 ng/mL — AB (ref 30.00–100.00)

## 2017-10-15 LAB — FERRITIN: Ferritin: 47.5 ng/mL (ref 10.0–291.0)

## 2017-10-15 LAB — IBC PANEL
Iron: 83 ug/dL (ref 42–145)
SATURATION RATIOS: 24.4 % (ref 20.0–50.0)
Transferrin: 243 mg/dL (ref 212.0–360.0)

## 2017-10-15 MED ORDER — CHOLECALCIFEROL 50 MCG (2000 UT) PO TABS: 1.0000 | ORAL_TABLET | Freq: Every day | ORAL | 1 refills | Status: DC

## 2017-10-15 NOTE — Patient Instructions (Signed)
Complete Blood Count Why am I having this test? A complete blood count is a group of tests that measures characteristics of three types of blood cells: white blood cells, red blood cells, and platelets. The following blood tests are included in a complete blood count:  White blood cell count. This test measures the number of white blood cells you have.  White blood cell differential. This test identifies the types of white blood cells you have and the concentration of each. It also identifies immature white blood cells.  Red blood cell count. This test measures the number of red blood cells you have.  Hemoglobin. This test measures the amount of hemoglobin in your blood.  Hematocrit. This test measures the percentage of space that red blood cells take up in your blood.  Mean corpuscular volume. This test measures the average size of your red blood cells.  Mean corpuscular hemoglobin. This test measures of the average amount of hemoglobin inside each of your red blood cells.  Mean corpuscular hemoglobin concentration. This test calculates the average concentration of hemoglobin inside each of your red blood cells.  Red blood cell distribution width. This test measures the variation in the size of your red blood cells.  Platelet count. This test measures the concentration of platelets in your blood.  Mean platelet volume. This test measures the average size of the platelets in your blood.  What kind of sample is taken? A blood sample is required for this test. It is usually collected by inserting a needle into a vein. How do I prepare for this test? There is no preparation required for this test. If this test is being performed in addition to other tests, your health care provider may ask you to fast before testing. What are the reference ranges? Reference ranges are considered healthy ranges established after testing a large group of healthy people. Reference ranges may vary among  different people, labs, and hospitals. These ranges will be provided by the lab or department performing your tests. It is your responsibility to obtain your test results. Ask the lab or department performing the test when and how you will get your results. What do the results mean? If your results are outside of the reference ranges, you may have a condition or illness, such as anemia, an infection, a bleeding problem, or cancer. The following are some examples of abnormal results and their possible causes. Abnormal Results Related to [facility] Blood Cells  An abnormally low concentration of white blood cells can be caused by certain infections and by conditions that interfere with the production of white blood cells in your bone marrow.  An abnormally high concentration of white blood cells may be related to infections and conditions that cause inflammation or a blood-related cancer.  The presence of immature white blood cells may be related to an infection or a condition affecting your bone marrow. Abnormal Results Related to Red Blood Cells  An abnormally low concentration of red blood cells, hemoglobin, or hematocrit is called anemia.  An abnormally high concentration of red blood cells, hemoglobin, or hematocrit is called polycythemia. It may be related to mild thalassemia. Thalassemia is a type of anemia that is passed down through families (hereditary).  An abnormally low mean corpuscular volume means that your red blood cells are smaller than normal. It may be related to thalassemia or iron deficiency anemia. Iron deficiency anemia is a type of anemia that results from a lack of iron.  An abnormally high mean  corpuscular volume means that your red blood cells are larger than normal. This may be related to anemia caused by a lack of vitamin B12. It can also be caused by a lot of new red blood cells in your blood. This can happen after you suddenly lose a lot of blood.  An abnormally low mean  corpuscular hemoglobin concentration can mean that you have a condition in which your hemoglobin is abnormally diluted inside the red cells. Examples of these types of conditions are iron deficiency anemia and thalassemia.  An abnormally high mean corpuscular hemoglobin concentration can mean that you have certain hemolytic anemias. Hemolytic anemia is anemia that results from the abnormal breakdown of your red blood cells.  An abnormally increased red cell distribution width may be related to certain anemias, sudden blood loss, or severe thalassemia. Abnormal Results Related to Platelets  An abnormally low concentration of platelets can be a sign of a bleeding disorder.  An abnormally high concentration of platelets can occur with iron deficiency anemia, inflammatory disorders, and cancers. It can result from physical stresses, such as exercise or blood loss. It may also be a sign of a clotting disorder.  An abnormally high mean platelet volume can occur with certain bone marrow cancers and with a large increase in the number of new platelets being produced by your bone marrow. This can happen after the loss of a large amount of blood or the destruction of your platelets by antibodies. Talk with your health care provider to discuss your results, treatment options, and if necessary, the need for more tests. Talk with your health care provider if you have any questions about your results. Talk with your health care provider to discuss your results, treatment options, and if necessary, the need for more tests. Talk with your health care provider if you have any questions about your results. This information is not intended to replace advice given to you by your health care provider. Make sure you discuss any questions you have with your health care provider. Document Released: 09/06/2004 Document Revised: 04/08/2016 Document Reviewed: 12/29/2013 Elsevier Interactive Patient Education  [employer].

## 2017-10-15 NOTE — Progress Notes (Signed)
Subjective:  Patient ID: Shelley Perez, female    DOB: 1951-02-07  Age: 67 y.o. MRN: [id number]  CC: Osteoarthritis and Back Pain   HPI Shelley Perez presents for f/up - She recently saw a rheumatologist for joint and back pain and has been told that she has osteoarthritis and is taking Celebrex.  She has noticed some improvement with that.  She otherwise feels well and offers no complaints.  She needs a follow-up regarding her elevated platelet count.  She has had no recent episode of bleeding or bruising.  Outpatient Medications Prior to Visit  Medication Sig Dispense Refill  . aspirin 81 MG tablet Take 81 mg by mouth daily.    . budesonide-formoterol (SYMBICORT) 80-4.5 MCG/ACT inhaler Take 2 puffs first thing in am and then another 2 puffs about 12 hours later. 1 Inhaler 3  . celecoxib (CELEBREX) 100 MG capsule Take 1 capsule (100 mg total) by mouth 2 (two) times daily. 60 capsule 1  . diclofenac sodium (VOLTAREN) 1 % GEL Apply 1 application topically 4 (four) times daily as needed.    . famotidine (PEPCID) 20 MG tablet Take 20 mg by mouth 2 (two) times daily.    . furosemide (LASIX) 20 MG tablet TAKE 1 TABLET(20 MG) BY MOUTH TWICE DAILY 60 tablet 2  . ipratropium-albuterol (DUONEB) 0.5-2.5 (3) MG/3ML SOLN Up to one every 4 hours as needed    . linaclotide (LINZESS) 145 MCG CAPS capsule Take 1 capsule (145 mcg total) by mouth daily before breakfast. (Patient taking differently: Take 145 mcg by mouth daily as needed. ) 90 capsule 3  . Misc. Devices (ACAPELLA) MISC Use as needed.    . potassium chloride (K-DUR) 10 MEQ tablet TAKE 1 TABLET BY MOUTH DAILY 30 tablet 2  . sertraline (ZOLOFT) 100 MG tablet Take 1 tablet (100 mg total) by mouth daily. 90 tablet 3  . Pseudoephedrine-Guaifenesin (MUCINEX D MAX STRENGTH) [phone] MG TB12 Take 1 tablet by mouth 2 (two) times daily as needed.     No facility-administered medications prior to visit.     ROS Review of Systems  Constitutional:  Negative.  Negative for diaphoresis, fatigue and unexpected weight change.  HENT: Negative.  Negative for sore throat and trouble swallowing.   Eyes: Negative.   Respiratory: Negative.  Negative for cough, chest tightness, shortness of breath and wheezing.   Cardiovascular: Negative for chest pain, palpitations and leg swelling.  Gastrointestinal: Negative for abdominal pain, constipation, diarrhea, nausea and vomiting.  Endocrine: Negative.   Genitourinary: Negative.  Negative for difficulty urinating and hematuria.  Musculoskeletal: Positive for arthralgias and back pain. Negative for myalgias and neck pain.  Skin: Negative.  Negative for pallor and rash.  Allergic/Immunologic: Negative.   Neurological: Negative.  Negative for dizziness, weakness and numbness.  Hematological: Negative for adenopathy. Does not bruise/bleed easily.  Psychiatric/Behavioral: Negative.     Objective:  BP 128/62 (BP Location: Left Arm, Patient Position: Sitting, Cuff Size: Large)   Pulse 71   Temp 98.5 F (36.9 C) (Oral)   Resp 16   Ht 5\' 1"  (1.549 m)   Wt 192 lb (87.1 kg)   SpO2 95%   BMI 36.28 kg/m   BP Readings from Last 3 Encounters:  10/15/17 128/62  09/10/17 140/76  08/31/17 130/81    Wt Readings from Last 3 Encounters:  10/15/17 192 lb (87.1 kg)  09/10/17 190 lb 12.8 oz (86.5 kg)  08/31/17 191 lb (86.6 kg)    Physical Exam  Constitutional: She  is oriented to person, place, and time. No distress.  HENT:  Mouth/Throat: Oropharynx is clear and moist. No oropharyngeal exudate.  Eyes: Conjunctivae are normal. Left eye exhibits no discharge. No scleral icterus.  Neck: Normal range of motion. Neck supple. No JVD present. No thyromegaly present.  Cardiovascular: Normal rate, regular rhythm and normal heart sounds. Exam reveals no gallop.  No murmur heard. Pulmonary/Chest: Effort normal and breath sounds normal. No respiratory distress. She has no wheezes. She has no rales.  Abdominal:  Soft. Bowel sounds are normal. She exhibits no distension and no mass. There is no hepatosplenomegaly, splenomegaly or hepatomegaly. There is no tenderness. There is no guarding.  Musculoskeletal: Normal range of motion. She exhibits no edema or tenderness.  Lymphadenopathy:    She has no cervical adenopathy.  Neurological: She is alert and oriented to person, place, and time.  Skin: Skin is warm and dry. No rash noted. She is not diaphoretic. No erythema. No pallor.  Vitals reviewed.   Lab Results  Component Value Date   WBC 7.4 10/15/2017   HGB 12.6 10/15/2017   HCT 38.1 10/15/2017   PLT 470.0 (H) 10/15/2017   GLUCOSE 90 08/26/2017   CHOL 215 (H) 01/21/2017   TRIG 155.0 (H) 01/21/2017   HDL 62.00 01/21/2017   LDLCALC 122 (H) 01/21/2017   ALT 12 08/26/2017   AST 13 08/26/2017   NA 139 08/26/2017   K 4.2 08/26/2017   CL 103 08/26/2017   CREATININE 1.00 (H) 08/26/2017   BUN 21 08/26/2017   CO2 29 08/26/2017   TSH 2.92 01/21/2017   INR 1.06 06/29/2015   HGBA1C 5.6 01/21/2017    Dg Elbow Complete Right  Result Date: 10/18/2016 CLINICAL DATA:  Injured elbow yesterday. Posterior elbow pain and swelling. EXAM: RIGHT ELBOW - COMPLETE 3+ VIEW COMPARISON:  None. FINDINGS: The joint spaces are maintained. No acute bony findings. Tiny spur noted at the olecranon process. No definite joint effusion. IMPRESSION: No acute bony findings, osteochondral lesion or joint effusion. Electronically Signed   By: Shelley Perez M.D.   On: 10/18/2016 17:17    Assessment & Plan:   [name] was seen today for osteoarthritis and back pain.  Diagnoses and all orders for this visit:  Thrombocytosis ([place]) - Her platelet count is mildly elevated at 470 but this is stable.  Her other cell lines are normal and she has no symptoms related to this.  The only suspicious finding on her lab work is that her B12 level is trending low.  This can explain an elevated platelet count so I have asked her to start taking  a high-dose oral vitamin B12 supplement. -     CBC with Differential/Platelet; Future -     IBC panel; Future -     Vitamin B12; Future -     Ferritin; Future -     cyanocobalamin 2000 MCG tablet; Take 1 tablet (2,000 mcg total) by mouth daily.  Breast cancer screening -     Cancel: MM Digital Screening; Future -     Cancel: MM Digital Screening; Future -     MM SCREENING BREAST TOMO BILATERAL; Future  Vitamin D deficiency- Her vitamin D level is down to 24.  I have asked her to start a vitamin D supplement. -     VITAMIN D 25 Hydroxy (Vit-D Deficiency, Fractures); Future -     Discontinue: Cholecalciferol 2000 units TABS; Take 1 tablet (2,000 Units total) by mouth daily. -  Cholecalciferol 2000 units TABS; Take 1 tablet (2,000 Units total) by mouth daily.   I have discontinued Shelley Perez's Pseudoephedrine-Guaifenesin. I am also having her start on cyanocobalamin. Additionally, I am having her maintain her aspirin, sertraline, linaclotide, ipratropium-albuterol, budesonide-formoterol, famotidine, ACAPELLA, celecoxib, diclofenac sodium, furosemide, potassium chloride, and Cholecalciferol.  Meds ordered this encounter  Medications  . DISCONTD: Cholecalciferol 2000 units TABS    Sig: Take 1 tablet (2,000 Units total) by mouth daily.    Dispense:  90 tablet    Refill:  1  . Cholecalciferol 2000 units TABS    Sig: Take 1 tablet (2,000 Units total) by mouth daily.    Dispense:  90 tablet    Refill:  1  . cyanocobalamin 2000 MCG tablet    Sig: Take 1 tablet (2,000 mcg total) by mouth daily.    Dispense:  90 tablet    Refill:  1     Follow-up: Return in about 4 months (around 02/12/2018).  Shelley Calico, MD

## 2017-10-16 ENCOUNTER — Encounter: Payer: Self-pay | Admitting: Internal Medicine

## 2017-10-16 MED ORDER — CYANOCOBALAMIN 2000 MCG PO TABS: 2000.0000 ug | ORAL_TABLET | Freq: Every day | ORAL | 1 refills | Status: DC

## 2017-10-16 MED ORDER — CHOLECALCIFEROL 50 MCG (2000 UT) PO TABS: 1.0000 | ORAL_TABLET | Freq: Every day | ORAL | 1 refills | Status: DC

## 2017-10-21 ENCOUNTER — Ambulatory Visit (INDEPENDENT_AMBULATORY_CARE_PROVIDER_SITE_OTHER): Payer: Medicare Other

## 2017-10-21 DIAGNOSIS — Z1231 Encounter for screening mammogram for malignant neoplasm of breast: Secondary | ICD-10-CM

## 2017-10-21 DIAGNOSIS — Z1239 Encounter for other screening for malignant neoplasm of breast: Secondary | ICD-10-CM

## 2017-10-22 LAB — HM MAMMOGRAPHY

## 2017-10-26 DIAGNOSIS — H2513 Age-related nuclear cataract, bilateral: Secondary | ICD-10-CM | POA: Diagnosis not present

## 2017-10-26 DIAGNOSIS — H5712 Ocular pain, left eye: Secondary | ICD-10-CM | POA: Diagnosis not present

## 2017-11-05 ENCOUNTER — Telehealth: Payer: Self-pay | Admitting: Internal Medicine

## 2017-11-05 NOTE — Telephone Encounter (Signed)
Patient would like the results of the mammo she had on 10/22/17

## 2017-11-06 ENCOUNTER — Encounter: Payer: Self-pay | Admitting: Internal Medicine

## 2017-11-06 NOTE — Telephone Encounter (Signed)
Okay per verbal from PCP to give pt the following information:   IMPRESSION: No mammographic evidence of malignancy. A result letter of this screening mammogram will be mailed directly to the patient.

## 2017-11-06 NOTE — Telephone Encounter (Signed)
This encounter was created in error - please disregard.

## 2017-11-06 NOTE — Telephone Encounter (Signed)
Called and left pt vm to call back.   CRM created. May give results.

## 2017-11-06 NOTE — Telephone Encounter (Addendum)
Pt given mammogram results. Pt verbalized understanding.

## 2017-11-10 ENCOUNTER — Encounter: Payer: Self-pay | Admitting: Family Medicine

## 2017-11-10 ENCOUNTER — Ambulatory Visit (INDEPENDENT_AMBULATORY_CARE_PROVIDER_SITE_OTHER): Payer: Medicare Other | Admitting: Family Medicine

## 2017-11-10 ENCOUNTER — Ambulatory Visit (INDEPENDENT_AMBULATORY_CARE_PROVIDER_SITE_OTHER): Payer: Medicare Other

## 2017-11-10 VITALS — BP 157/68 | HR 64 | Wt 191.0 lb

## 2017-11-10 DIAGNOSIS — M898X6 Other specified disorders of bone, lower leg: Secondary | ICD-10-CM

## 2017-11-10 DIAGNOSIS — S8992XA Unspecified injury of left lower leg, initial encounter: Secondary | ICD-10-CM | POA: Diagnosis not present

## 2017-11-10 DIAGNOSIS — R0781 Pleurodynia: Secondary | ICD-10-CM

## 2017-11-10 DIAGNOSIS — W19XXXA Unspecified fall, initial encounter: Secondary | ICD-10-CM | POA: Diagnosis not present

## 2017-11-10 DIAGNOSIS — M79662 Pain in left lower leg: Secondary | ICD-10-CM | POA: Diagnosis not present

## 2017-11-10 DIAGNOSIS — S2241XA Multiple fractures of ribs, right side, initial encounter for closed fracture: Secondary | ICD-10-CM | POA: Diagnosis not present

## 2017-11-10 NOTE — Progress Notes (Signed)
Shelley Perez is a 67 y.o. female who presents to [facility]: [place] today for left rib and shin pain. Faryn tripped and fell at home 1 week ago. She landed on her left side. She notes significant pain in her left anterior and lateral rib and breast. The pain is worse with pressure and with deep inspiration and cough or laugh. She denies any shortness of breath. She denies any significant central chest pain.    Additionally Trudie notes pain in the left anterior shin.  This occurred when she fell as well.  She notes some bruising distally into her ankle without significant pain.  She denies any fevers or chills vomiting or diarrhea.   Past Medical History:  Diagnosis Date  . Anemia   . Anxiety   . Arthritis   . Asthma   . Complication of anesthesia    "they gave me too much and I had to stay 3 days " 2005 after choley   . COPD (chronic obstructive pulmonary disease) ([place])   . Depression   . Fibromyalgia   . History of kidney stones   . Hx of pancreatitis    with gallstones  . Hyperlipidemia   . Insomnia   . Osteoporosis    Past Surgical History:  Procedure Laterality Date  . c sections    . CHOLECYSTECTOMY    . DENTAL SURGERY    . FOOT SURGERY    . KNEE SURGERY  1978 / 1971  . SHOULDER SURGERY    . 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 Smoking status: Former Smoker    Packs/day: 1.00    Years: 40.00    Pack years: 40.00    Types: Cigarettes    Last attempt to quit: 06/28/2013    Years since quitting: 4.3  . Smokeless tobacco: Never Used  Substance Use Topics  . Alcohol use: No    Alcohol/week: 0.0 oz   family history includes Asthma in her sister; Cancer in her father; Heart  attack in her mother; Hyperlipidemia in her mother; Hypertension in her mother; Lung cancer in her sister.  ROS as above:  Medications: Current Outpatient Medications  Medication Sig Dispense Refill  . aspirin 81 MG tablet Take 81 mg by mouth daily.    . budesonide-formoterol (SYMBICORT) 80-4.5 MCG/ACT inhaler Take 2 puffs first thing in am and then another 2 puffs about 12 hours later. 1 Inhaler 3  . celecoxib (CELEBREX) 100 MG capsule Take 1 capsule (100 mg total) by mouth 2 (two) times daily. 60 capsule 1  . Cholecalciferol 2000 units TABS Take 1 tablet (2,000 Units total) by mouth daily. 90 tablet 1  . cyanocobalamin 2000 MCG tablet Take 1 tablet (2,000 mcg total) by mouth daily. 90 tablet 1  . diclofenac sodium (VOLTAREN) 1 % GEL Apply 1 application topically 4 (four) times daily as needed.    . famotidine (PEPCID) 20 MG tablet Take 20 mg by mouth 2 (two) times daily.    . furosemide (LASIX) 20 MG tablet TAKE 1 TABLET(20 MG) BY MOUTH TWICE DAILY 60 tablet 2  . ipratropium-albuterol (DUONEB) 0.5-2.5 (3) MG/3ML SOLN Up to one every 4 hours as needed    . linaclotide (LINZESS) 145 MCG CAPS capsule Take 1 capsule (145 mcg total)  by mouth daily before breakfast. (Patient taking differently: Take 145 mcg by mouth daily as needed. ) 90 capsule 3  . Misc. Devices (ACAPELLA) MISC Use as needed.    . potassium chloride (K-DUR) 10 MEQ tablet TAKE 1 TABLET BY MOUTH DAILY 30 tablet 2  . sertraline (ZOLOFT) 100 MG tablet Take 1 tablet (100 mg total) by mouth daily. 90 tablet 3   No current facility-administered medications for this visit.    Allergies  Allergen Reactions  . Rosuvastatin Calcium     Other reaction(s): Other Muscle and joint pain  . Atorvastatin     Muscle and joint pain  . Codeine Nausea Only and Other (See Comments)    Stomach pains  . Crestor [Rosuvastatin]     Muscle and joint pain  . Prozac [Fluoxetine Hcl] Other (See Comments)    Changes her personality  . Latex Rash    . Sulfa Antibiotics Rash    Health Maintenance Health Maintenance  Topic Date Due  . Fecal DNA (Cologuard)  03/21/2019  . MAMMOGRAM  10/23/2019  . PNA vac Low Risk Adult (2 of 2 - PPSV23) 05/13/2020  . TETANUS/TDAP  06/05/2026  . INFLUENZA VACCINE  Completed  . DEXA SCAN  Completed  . Hepatitis C Screening  Completed     Exam:  BP (!) 157/68   Pulse 64   Wt 191 lb (86.6 kg)   BMI 36.09 kg/m  Gen: Well NAD HEENT: EOMI,  MMM Lungs: Normal work of breathing.  Crackles present with inspiration left lower lung field Heart: RRR no MRG Breast and chest wall free of ecchymosis.  Tender to palpation left anterior to lateral lower ribs Abd: NABS, Soft. Nondistended, Nontender Exts: Brisk capillary refill, warm and well perfused.  MSK: Left lower leg no swelling.  Tender to palpation anterior shin.  Mild ecchymosis ATFL insertion onto the lateral ankle without pain. Ankle motion pulses capillary refill and sensation are intact.  Personal interpretation of left rib and chest x-ray and left tibia x-ray images:  Ribs and chest: No obvious rib fracture or pneumothorax.  Possible atelectasis left lower lung. Tibia and fibula left leg normal Awaiting formal radiology review     Assessment and Plan: 67 y.o. female with left rib pain due to contusion or injury of the cartilage component of the false ribs.  Plan for rib binder continued home Celebrex.  Use Tylenol in addition as needed.  Patient notes that she has leftover hydrocodone that she can use sparingly for severe pain.  Cautioned against overuse of acetaminophen.  Additionally we have provided an incentive spirometer.  Recheck in about 4 weeks.  Left leg pain: Contusion.  No evidence of fracture.  Plan for watchful waiting.  Pain management as above.   Orders Placed This Encounter  Procedures  . DG Ribs Unilateral W/Chest Left    Order Specific Question:   Reason for exam:    Answer:   Injury, rib pain. assess for fracture,  pneumothorax.    Order Specific Question:   Preferred imaging location?    Answer:   [facility]  . DG Tibia/Fibula Left    Standing Status:   Future    Number of Occurrences:   1    Standing Expiration Date:   01/11/2019    Order Specific Question:   Reason for Exam (SYMPTOM  OR DIAGNOSIS REQUIRED)    Answer:   fall left anterior tibia pain    Order Specific Question:   Preferred imaging location?  Answer:   [facility]    Order Specific Question:   Radiology Contrast Protocol - do NOT remove file path    Answer:   [email or web address]   No orders of the defined types were placed in this encounter.    Discussed warning signs or symptoms. Please see discharge instructions. Patient expresses understanding.  CC: Janith Lima, MD

## 2017-11-10 NOTE — Patient Instructions (Signed)
Thank you for coming in today. Continue celebrex.  You can use hydrocodone sparingly for severe pain.  Use the rib belt as needed.  Use the incentive spirometer every 15 -30 mins while awake.  Recheck with me in 4 weeks.  Return if worsening.  This will take a few weeks to get better.    Rib Contusion A rib contusion is a deep bruise on your rib area. Contusions are the result of a blunt trauma that causes bleeding and injury to the tissues under the skin. A rib contusion may involve bruising of the ribs and of the skin and muscles in the area. The skin overlying the contusion may turn blue, purple, or yellow. Minor injuries will give you a painless contusion, but more severe contusions may stay painful and swollen for a few weeks. What are the causes? A contusion is usually caused by a blow, trauma, or direct force to an area of the body. This often occurs while playing contact sports. What are the signs or symptoms?  Swelling and redness of the injured area.  Discoloration of the injured area.  Tenderness and soreness of the injured area.  Pain with or without movement. How is this diagnosed? The diagnosis can be made by taking a medical history and performing a physical exam. An X-ray, CT scan, or MRI may be needed to determine if there were any associated injuries, such as broken bones (fractures) or internal injuries. How is this treated? Often, the best treatment for a rib contusion is rest. Icing or applying cold compresses to the injured area may help reduce swelling and inflammation. Deep breathing exercises may be recommended to reduce the risk of partial lung collapse and pneumonia. Over-the-counter or prescription medicines may also be recommended for pain control. Follow these instructions at home:  Apply ice to the injured area: ? Put ice in a plastic bag. ? Place a towel between your skin and the bag. ? Leave the ice on for 20 minutes, 2-3 times per day.  Take  medicines only as directed by your health care provider.  Rest the injured area. Avoid strenuous activity and any activities or movements that cause pain. Be careful during activities and avoid bumping the injured area.  Perform deep-breathing exercises as directed by your health care provider.  Do not lift anything that is heavier than 5 lb (2.3 kg) until your health care provider approves.  Do not use any tobacco products, including cigarettes, chewing tobacco, or electronic cigarettes. If you need help quitting, ask your health care provider. Contact a health care provider if:  You have increased bruising or swelling.  You have pain that is not controlled with treatment.  You have a fever. Get help right away if:  You have difficulty breathing or shortness of breath.  You develop a continual cough, or you cough up thick or bloody sputum.  You feel sick to your stomach (nauseous), you throw up (vomit), or you have abdominal pain. This information is not intended to replace advice given to you by your health care provider. Make sure you discuss any questions you have with your health care provider. Document Released: 04/29/2001 Document Revised: 01/10/2016 Document Reviewed: 05/16/2014 Elsevier Interactive Patient Education  2018 [employer].  Rib Belt Rib belts can be used to help control the pain from a chest injury. The belt gives gentle support to the injured area. It also helps reduce chest motion. What are the risks? Rib belts can increase the risk of getting  pneumonia after a chest injury. You must be sure to breathe deeply and cough several times every hour to keep your lungs clear. Do not use a rib belt if it does not help relieve your pain. How to apply the rib belt Wear the rib belt only as directed by your health care provider. Take the belt off at night when you go to bed. 1. Place the belt across your back and stretch the ends out and forward across your rib  cage. 2. Press the velcro areas together when the belt is putting gentle pressure on your chest wall. Do not make the rib belt too tight. Make sure you can breathe comfortably.  Contact a health care provider if: You have a fever. Get help right away if:  You develop pus-like sputum or an uncontrolled cough.  You begin coughing up blood.  You have pain that is getting worse or is not controlled with medicines. This information is not intended to replace advice given to you by your health care provider. Make sure you discuss any questions you have with your health care provider. Document Released: 09/11/2004 Document Revised: 01/10/2016 Document Reviewed: 12/20/2013 Elsevier Interactive Patient Education  2018 [employer].

## 2017-11-19 ENCOUNTER — Other Ambulatory Visit: Payer: Self-pay

## 2017-11-19 ENCOUNTER — Emergency Department
Admission: EM | Admit: 2017-11-19 | Discharge: 2017-11-19 | Disposition: A | Discharge status: Home / Self Care | Payer: Medicare Other | Source: Home / Self Care | Attending: Family Medicine | Admitting: Family Medicine

## 2017-11-19 DIAGNOSIS — B029 Zoster without complications: Secondary | ICD-10-CM

## 2017-11-19 MED ORDER — VALACYCLOVIR HCL 1 G PO TABS: 1000.0000 mg | ORAL_TABLET | Freq: Three times a day (TID) | ORAL | 0 refills | Status: DC

## 2017-11-19 MED ORDER — METHYLPREDNISOLONE ACETATE 80 MG/ML IJ SUSP
80.0000 mg | Freq: Once | INTRAMUSCULAR | Status: AC
  Administered 2017-11-19: 80 mg via INTRAMUSCULAR

## 2017-11-19 NOTE — Discharge Instructions (Addendum)
Recommend Shingrix vaccination when rash resolved.

## 2017-11-19 NOTE — ED Triage Notes (Signed)
Pt c/o rash on R side of neck. Thinks it may be shingles. Has had shingles in the past. Is not vaccinated for Shingles.

## 2017-11-19 NOTE — ED Provider Notes (Signed)
[facility]    CSN: [id number] Arrival date & time: 11/19/17  1129     History   Chief Complaint Chief Complaint  Patient presents with  . Rash    Shingles like    HPI Shelley Perez is a 67 y.o. female.   Patient complains of pruritic painful rash on her right neck and behind her right ear for two days.  The history is provided by the patient.  Rash  Location:  Head/neck Head/neck rash location:  R neck and R ear Quality: burning, dryness, itchiness, painful and redness   Quality: not blistering, not bruising, not draining, not peeling, not swelling and not weeping   Pain details:    Quality:  Aching, burning and itching   Severity:  Mild   Onset quality:  Sudden   Duration:  2 days   Timing:  Constant   Progression:  Worsening Severity:  Mild Onset quality:  Sudden Duration:  2 days Timing:  Constant Progression:  Worsening Chronicity:  New Context: not animal contact, not chemical exposure, not exposure to similar rash, not food, not hot tub use, not insect bite/sting, not medications, not new detergent/soap, not nuts and not plant contact   Relieved by:  None tried Worsened by:  Nothing Ineffective treatments:  None tried Associated symptoms: headaches   Associated symptoms: no fatigue, no fever, no induration, no myalgias and no nausea     Past Medical History:  Diagnosis Date  . Anemia   . Anxiety   . Arthritis   . Asthma   . Complication of anesthesia    "they gave me too much and I had to stay 3 days " 2005 after choley   . COPD (chronic obstructive pulmonary disease) ([place])   . Depression   . Fibromyalgia   . History of kidney stones   . Hx of pancreatitis    with gallstones  . Hyperlipidemia   . Insomnia   . Osteoporosis     Patient Active Problem List   Diagnosis Date Noted  . 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 Constipation 01/21/2017  . Hyperlipidemia LDL goal <130 01/21/2017  . Thrombocytosis ([place]) 01/21/2017  . Panic  attacks 01/01/2017  . Neuralgia of left lower extremity 06/05/2016  . Gastroesophageal reflux disease with esophagitis 01/29/2016  . Osteoporosis 01/29/2016  . Vitamin D deficiency 01/29/2016  . Menopause ovarian failure 01/29/2016  . Upper airway cough syndrome 12/12/2015  . Abnormal CT of the chest 11/22/2015  . COPD with acute exacerbation ([place]) 11/21/2015  . Class 2 obesity with body mass index (BMI) of 36.0 to 36.9 in adult 07/11/2015    Past Surgical History:  Procedure Laterality Date  . c sections    . CHOLECYSTECTOMY    . DENTAL SURGERY    . FOOT SURGERY    . KNEE SURGERY  1978 / 1971  . SHOULDER SURGERY    . TOTAL KNEE ARTHROPLASTY Right 07/10/2015   Procedure: RIGHT TOTAL KNEE ARTHROPLASTY;  Surgeon: Paralee Cancel, MD;  Location: [facility];  Service: Orthopedics;  Laterality: Right;    OB History   None      Home Medications    Prior to Admission medications   Medication Sig Start Date End Date Taking? Authorizing Provider  aspirin 81 MG tablet Take 81 mg by mouth daily.    [provider]  budesonide-formoterol (SYMBICORT) 80-4.5 MCG/ACT inhaler Take 2 puffs first thing in am and then another 2 puffs about 12 hours later. 06/09/17  Parrett, Tammy S, NP  celecoxib (CELEBREX) 100 MG capsule Take 1 capsule (100 mg total) by mouth 2 (two) times daily. 08/31/17   Gregor Hams, MD  Cholecalciferol 2000 units TABS Take 1 tablet (2,000 Units total) by mouth daily. 10/16/17   Janith Lima, MD  cyanocobalamin 2000 MCG tablet Take 1 tablet (2,000 mcg total) by mouth daily. 10/16/17   Janith Lima, MD  diclofenac sodium (VOLTAREN) 1 % GEL Apply 1 application topically 4 (four) times daily as needed. 10/17/16   [provider]  famotidine (PEPCID) 20 MG tablet Take 20 mg by mouth 2 (two) times daily.    [provider]  furosemide (LASIX) 20 MG tablet TAKE 1 TABLET(20 MG) BY MOUTH TWICE DAILY 10/13/17   Janith Lima, MD  ipratropium-albuterol  (DUONEB) 0.5-2.5 (3) MG/3ML SOLN Up to one every 4 hours as needed 05/21/17   Tanda Rockers, MD  linaclotide [facility]) 145 MCG CAPS capsule Take 1 capsule (145 mcg total) by mouth daily before breakfast. Patient taking differently: Take 145 mcg by mouth daily as needed.  01/21/17   Janith Lima, MD  Misc. Devices (ACAPELLA) MISC Use as needed.    [provider]  potassium chloride (K-DUR) 10 MEQ tablet TAKE 1 TABLET BY MOUTH DAILY 10/13/17   Janith Lima, MD  sertraline (ZOLOFT) 100 MG tablet Take 1 tablet (100 mg total) by mouth daily. 02/18/16   Janith Lima, MD  valACYclovir (VALTREX) 1000 MG tablet Take 1 tablet (1,000 mg total) by mouth 3 (three) times daily. 11/19/17   Kandra Nicolas, MD    Family History Family History  Problem Relation Age of Onset  . Hyperlipidemia Mother   . Hypertension Mother   . Heart attack Mother   . Cancer Father   . Asthma Sister   . Lung cancer Sister        smoked    Social History Social History   Tobacco Use  . Smoking status: Former Smoker    Packs/day: 1.00    Years: 40.00    Pack years: 40.00    Types: Cigarettes    Last attempt to quit: 06/28/2013    Years since quitting: 4.3  . Smokeless tobacco: Never Used  Substance Use Topics  . Alcohol use: No    Alcohol/week: 0.0 oz  . Drug use: No     Allergies   Rosuvastatin calcium; Atorvastatin; Codeine; Crestor [rosuvastatin]; Prozac [fluoxetine hcl]; Latex; and Sulfa antibiotics   Review of Systems Review of Systems  Constitutional: Negative for fatigue and fever.  Gastrointestinal: Negative for nausea.  Musculoskeletal: Negative for myalgias.  Skin: Positive for rash.  Neurological: Positive for headaches.  All other systems reviewed and are negative.    Physical Exam Triage Vital Signs ED Triage Vitals  Enc Vitals Group     BP 11/19/17 1158 (!) 149/73     Pulse Rate 11/19/17 1158 81     Resp 11/19/17 1158 18     Temp 11/19/17 1158 98.4 F (36.9 C)      Temp Source 11/19/17 1158 Oral     SpO2 11/19/17 1158 96 %     Weight 11/19/17 1159 190 lb (86.2 kg)     Height --      Head Circumference --      Peak Flow --      Pain Score 11/19/17 1159 0     Pain Loc --      Pain Edu? --  Excl. in GC? --    No data found.  Updated Vital Signs BP (!) 149/73 (BP Location: Right Arm)   Pulse 81   Temp 98.4 F (36.9 C) (Oral)   Resp 18   Wt 190 lb (86.2 kg)   SpO2 96%   BMI 35.90 kg/m   Visual Acuity Right Eye Distance:   Left Eye Distance:   Bilateral Distance:    Right Eye Near:   Left Eye Near:    Bilateral Near:     Physical Exam  Constitutional: She appears well-developed and well-nourished. No distress.  HENT:  Head: Normocephalic.  Right Ear: External ear normal.  Left Ear: External ear normal.  Nose: Nose normal.  Mouth/Throat: Oropharynx is clear and moist.  Eyes: Pupils are equal, round, and reactive to light. Conjunctivae are normal.  Neck: Neck supple.    Erythematous herpetiform eruption right neck and behind right ear as noted on diagram.   Cardiovascular: Normal rate.  Pulmonary/Chest: Effort normal.  Lymphadenopathy:    She has no cervical adenopathy.  Neurological: She is alert.  Skin: Skin is warm and dry.  Nursing note and vitals reviewed.    UC Treatments / Results  Labs (all labs ordered are listed, but only abnormal results are displayed) Labs Reviewed - No data to display  EKG None Radiology No results found.  Procedures Procedures (including critical care time)  Medications Ordered in UC Medications  methylPREDNISolone acetate (DEPO-MEDROL) injection 80 mg (has no administration in time range)     Initial Impression / Assessment and Plan / UC Course  I have reviewed the triage vital signs and the nursing notes.  Pertinent labs & imaging results that were available during my care of the patient were reviewed by me and considered in my medical decision making (see chart for  details).    Administered Depo Medrol 80mg  IM  Begin Valtrex. Recommend Shingrix vaccination when rash resolved. Followup with Family Doctor if not improved in one week.   Final Clinical Impressions(s) / UC Diagnoses   Final diagnoses:  Herpes zoster without complication    ED Discharge Orders        Ordered    valACYclovir (VALTREX) 1000 MG tablet  3 times daily     11/19/17 1233          Kandra Nicolas, MD 11/25/17 2029

## 2017-11-21 ENCOUNTER — Telehealth: Payer: Self-pay | Admitting: Emergency Medicine

## 2017-11-21 NOTE — Telephone Encounter (Signed)
Attempted to contact patient left a voice message

## 2017-12-03 ENCOUNTER — Telehealth: Payer: Self-pay

## 2017-12-03 NOTE — Telephone Encounter (Signed)
Pt left VM regarding former visit. Was seen on 11/19/17 for shingles. Was given 3 doses of Valtrex. Pt says it hasnt really improved and was wondering if we could prescribe more medicine.

## 2017-12-08 ENCOUNTER — Ambulatory Visit: Payer: Medicare Other | Admitting: Family Medicine

## 2017-12-08 DIAGNOSIS — Z0189 Encounter for other specified special examinations: Secondary | ICD-10-CM

## 2017-12-10 ENCOUNTER — Ambulatory Visit: Payer: Medicare Other | Admitting: Internal Medicine

## 2017-12-10 ENCOUNTER — Ambulatory Visit (INDEPENDENT_AMBULATORY_CARE_PROVIDER_SITE_OTHER)
Admission: RE | Admit: 2017-12-10 | Discharge: 2017-12-10 | Disposition: A | Discharge status: Home / Self Care | Payer: Medicare Other | Source: Ambulatory Visit | Attending: Internal Medicine | Admitting: Internal Medicine

## 2017-12-10 ENCOUNTER — Encounter: Payer: Self-pay | Admitting: Internal Medicine

## 2017-12-10 VITALS — BP 124/74 | HR 68 | Ht 61.0 in | Wt 191.8 lb

## 2017-12-10 DIAGNOSIS — J441 Chronic obstructive pulmonary disease with (acute) exacerbation: Secondary | ICD-10-CM | POA: Diagnosis not present

## 2017-12-10 DIAGNOSIS — R05 Cough: Secondary | ICD-10-CM

## 2017-12-10 DIAGNOSIS — R058 Other specified cough: Secondary | ICD-10-CM

## 2017-12-10 DIAGNOSIS — S2232XD Fracture of one rib, left side, subsequent encounter for fracture with routine healing: Secondary | ICD-10-CM | POA: Diagnosis not present

## 2017-12-10 DIAGNOSIS — S2242XD Multiple fractures of ribs, left side, subsequent encounter for fracture with routine healing: Secondary | ICD-10-CM

## 2017-12-10 NOTE — Progress Notes (Signed)
Subjective:    Patient ID: Shelley Perez, female    DOB: 05-Dec-1950,   MRN: [id number]    Brief patient profile:  65 yowf quit smoking 2015 with some sensation of midline chest discomfort brought on by smoking that resolved @ 172lb p quit smoking with no breathing problems though tendency for colds going to chest and persistent sore throat since ET 11/22/116 for R knee then  worst episode middle of Feb 2017 >  sev ov's rx as flu with tamiflu / pred/ abx/ only some better so referred to pulmonary clinic 11/22/2015 by Dr  Philis Fendt at [facility]   History of Present Illness  11/22/2015 1st [facility] [place] [facility] office visit/ Azia Toutant   Chief Complaint  Patient presents with  . Pulmonary Consult    Referred by Dr. Philis Fendt. Pt c/o cough and SOB since she was dxed with Influenza in Feb 2017. Cough is prod with yellow sputum.    rx with doxy and prednisone x 4 days and qvar not better. Cough is worse at hs and in am and doe x more than slow walk on flat level = MMRC2 Having to use neb saba qid whereas prior to feb 2017 was not using it at all and prev on no maint rx  rec Plan A = Automatic =  symbicort 160 Take 2 puffs first thing in am and then another 2 puffs about 12 hours later.  Plan B = Backup  ok to use the nebulizer up to every 4 hours but if start needing it regularly call for immediate appointment GERD diet      12/28/2015  f/u ov/Barrie Sigmund re: GOLD 0 copd/ mild AB/uacs Chief Complaint  Patient presents with  . Follow-up    Breathing has improved. She has not had to use albuterol inhaler or neb.   can't take ppi regularly due to joints aching  Not taking pepcid at all  rec You do have significant copd and unlikely you ever will  Continue symbicort 80 Take 2 puffs first thing in am and then another 2 puffs about 12 hours later if you think you need it.  Try pepcid ac 20 mg after bfast and after supper  GERD diet     05/21/2017  f/u ov/Niccole Witthuhn re:  GOLD 0 copd/ AB with prob UACS  Chief  Complaint  Patient presents with  . Hospitalization Follow-up    Pt in hospital 05/13/17-05/15/17 due to COPD. States that she is still coughing with yellow to white mucus and c/o SOB all the time that is worse early in the morning and at night. Pt states that she is sore in the chest due to being hospitalized.  after last visit did fine on symbicort 80 2pffs daily but stopped it after several months but still had trouble had trouble with mowing grass due to cough/ sob and did not call as better when avoided exposure  [place] since late summer  2018 with recurrent cough/sob  eval by Ronnald Ramp 04/28/17 and no better p omnicef and prednisone  admtted 9/26-28/ 2018  [place] with dx "aecopd" rx with steroids and duoneb and better but not back to baseline ex tol and "never want to get that bad again. / not on any gerd rx  Still with coughing fits/ quite harsh with obvious pseudowheeze  And can't sleep flat due to cough and chest gen sore ant with cough rec For cough > mucinex dm up to 1200 mg every 12 hours with flutter valve as much  as possible  And if still coughing then use your cough syrup as needed   Plan A = Automatic = symbicort  80 Take 2 puffs first thing in am and then another 2 puffs about 12 hours later.  Pecpid 20 mg after bfast and supper  Work on inhaler technique:   GERD   Plan B = Backup Only use your nebulizer as a rescue medication  Prednisone 10 mg take  4 each am x 2 days,   2 each am x 2 days,  1 each am x 2 days and stop      09/10/2017  f/u ov/Parilee Hally re:  Cough flare since summer of 2018  variant asthma vs uacs  Chief Complaint  Patient presents with  . Follow-up    Breathing is doing well and she has not needed her neb. No new co's.   main issue is nasal obst at hs, not using  Any not nasal sprays / not using bed blocks  Overt hb symptoms with mild orophanygeal sense of dysphagia  Not limited by breathing from desired activities   rec Continue Plan A = Automatic =  symbicort  80 Take 2 puffs first thing in am and then another 2 puffs about 12 hours later.  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 Follow-up    Cough is "pretty good" only has occ cough now- occ prod with clear sputum.  She rarely uses neb. She states she had a fall approx 2 months ago and fractured 2 ribs on the left.   Dyspnea:  Worse = MMRC1 = can walk nl pace, flat grade, can't hurry or go uphills or steps s sob   Cough: much better Sleep: about 10 degrees  SABA use:  Not using at all   Still hurting on L where fx 2 ribs  - pain down to a 2/10  s hematoma/bruising   No obvious day to day or daytime variability or assoc  purulent sputum or mucus plugs or hemoptysis or  chest tightness, subjective wheeze or overt sinus or hb symptoms. No unusual exposure hx or h/o childhood pna/ asthma or knowledge of premature birth.  Sleeping  Ok at 10 degrees  without nocturnal  or early am exacerbation  of respiratory  c/o's or need for noct saba. Also denies any obvious fluctuation of symptoms with weather or environmental changes or other aggravating or alleviating factors except as outlined above   Current Allergies, Complete Past Medical History, Past Surgical History, Family History, and Social History were reviewed in [employer] record.  ROS  The following are not active complaints unless bolded Hoarseness, sore throat, dysphagia, dental problems, itching, sneezing,  nasal congestion or discharge of excess mucus or  purulent secretions, ear ache,   fever, chills, sweats, unintended wt loss or wt gain, classically pleuritic or exertional cp,  orthopnea pnd or arm/hand swelling  or  leg swelling, presyncope, palpitations, abdominal pain, anorexia, nausea, vomiting, diarrhea  or change in bowel habits or change in bladder habits, change in stools or change in urine, dysuria, hematuria,  rash, arthralgias, visual complaints, headache, numbness, weakness or ataxia or problems with walking or coordination,  change in mood or  memory.        Current Meds  Medication Sig  . aspirin 81 MG tablet Take 81 mg by mouth daily.  . budesonide-formoterol (SYMBICORT) 80-4.5 MCG/ACT inhaler Take 2 puffs first thing in am and then another 2 puffs about 12 hours later.  . celecoxib (CELEBREX) 100 MG capsule Take 1 capsule (100 mg total) by mouth 2 (two) times daily.  . Cholecalciferol 2000 units TABS Take 1 tablet (2,000 Units total) by mouth daily.  . cyanocobalamin 2000 MCG tablet Take 1 tablet (2,000 mcg total) by mouth daily.  . diclofenac sodium (VOLTAREN) 1 % GEL Apply 1 application topically 4 (four) times daily as needed.  . famotidine (PEPCID) 20 MG tablet Take 20 mg by mouth 2 (two) times daily.  . furosemide (LASIX) 20 MG tablet TAKE 1 TABLET(20 MG) BY MOUTH TWICE DAILY  . ipratropium-albuterol (DUONEB) 0.5-2.5 (3) MG/3ML SOLN Up to one every 4 hours as needed  . linaclotide (LINZESS) 145 MCG CAPS capsule Take 1 capsule (145 mcg total) by mouth daily before breakfast. (Patient taking differently: Take 145 mcg by mouth daily as needed. )  . Misc. Devices (ACAPELLA) MISC Use as needed.  . potassium chloride (K-DUR) 10 MEQ tablet TAKE 1 TABLET BY MOUTH DAILY  . sertraline (ZOLOFT) 100 MG tablet Take 1 tablet (100 mg total) by mouth daily.  . valACYclovir (VALTREX) 1000 MG tablet Take 1 tablet (1,000 mg total) by mouth 3 (three) times daily.                  Objective:   Physical Exam   elderly wf nad     12/10/2017       191  09/10/2017       191  05/21/2017       185  12/28/2015       187  12/11/2015        187   11/22/15 191 lb 12.8 oz (87 kg)  11/11/15 184 lb 12.8 oz (83.825 kg)  11/10/15 187 lb (84.823 kg)        Vital signs reviewed - Note on arrival 02 sats  97% on RA        HEENT: nl  oropharynx. Nl external ear canals without cough reflex- full top denture/ moderate bilateral non-specific turbinate edema    NECK :  without JVD/Nodes/TM/ nl carotid upstrokes bilaterally   LUNGS: no acc muscle use,  Nl contour chest which is clear to A and P bilaterally without cough on insp or exp maneuvers/ no obvious flail or rib instability on palpation.   CV:  RRR  no s3 or murmur or increase in P2, and no edema   ABD:  soft and nontender with nl inspiratory excursion in the supine position. No bruits or organomegaly appreciated, bowel sounds nl  MS:  Nl gait/ ext warm without deformities, calf tenderness, cyanosis or clubbing No obvious joint restrictions   SKIN: warm and dry without lesions    NEURO:  alert, approp, nl sensorium with  no motor or cerebellar deficits apparent.            CXR PA and Lateral:   12/10/2017 :    I  personally reviewed images and agree with radiology impression as follows:    No active cardiopulmonary disease. Healing anterior rib fractures on the left.       Assessment & Plan:

## 2017-12-10 NOTE — Patient Instructions (Addendum)
dymista one twice daily use up sample and call if you want to stay on it  Please remember to go to the  x-ray department downstairs in the basement  for your tests - we will call you with the results when they are available.      See Tammy NP in 3 months  with all your medications, even over the counter meds, separated in two separate bags, the ones you take no matter what vs the ones you stop once you feel better and take only as needed when you feel you need them.   Tammy  will generate for you a new user friendly medication calendar that will put [place] all on the same page re: your medication use.

## 2017-12-14 ENCOUNTER — Encounter: Payer: Self-pay | Admitting: Internal Medicine

## 2017-12-14 DIAGNOSIS — S2242XD Multiple fractures of ribs, left side, subsequent encounter for fracture with routine healing: Secondary | ICD-10-CM | POA: Insufficient documentation

## 2017-12-14 NOTE — Assessment & Plan Note (Signed)
Injury mid march 2019 > 2 L rib fx > 12/10/2017 healing nicely   Assured pain should gradually fade but will take several months

## 2017-12-14 NOTE — Assessment & Plan Note (Signed)
11/22/2015   try symbicort 160 2bid  - 12/11/2015   try reducing to symb 80 2bid  - PFT's  12/28/2015  FEV1 1.95 (91 % ) ratio 85  p 6 % improvement from saba p no rx prior to study with DLCO  85 % corrects to 106 % for alv volume   - 09/10/2017  After extensive coaching inhaler device  effectiveness =    75%    Resolved on symb 80 2bid > no change rx

## 2017-12-14 NOTE — Assessment & Plan Note (Addendum)
S/p ET placement for knee surgery 06/2015  - ENT Eval 03/19/16 shoemaker: GERD only  - Flare since 03/2017 partially responsive to steroids/ duoneb - added flutter valve 05/21/2017 and resumed bid h2  - Allergy profile 09/10/2017 >  Eos 0.3 /  IgE  19 RAST neg - Sinus CT 09/10/2017  : No sinusitis.   - 12/10/2017 trial of dymista one bid > if not better return to ENT    I had an extended discussion with the patient reviewing all relevant studies completed to date and  lasting 15 to 20 minutes of a 25 minute visit on the following ongoing concerns:   Trained on how to use nasal sprays to best benefit and what to expect from them  Formulary restrictions will be an ongoing challenge for the forseable future and I would be happy to pick an alternative if the pt will first  provide me a list of them but pt  will need to return here for training for any new device that is required eg dpi vs hfa vs respimat.    In meantime we can always provide samples so the patient never runs out of any needed respiratory medications.    Each maintenance medication was reviewed in detail including most importantly the difference between maintenance and as needed and under what circumstances the prns are to be used.  Please see AVS for specific  Instructions which are unique to this visit and I personally typed out  which were reviewed in detail in writing with the patient and a copy provided.

## 2017-12-31 ENCOUNTER — Ambulatory Visit (INDEPENDENT_AMBULATORY_CARE_PROVIDER_SITE_OTHER): Payer: Medicare Other

## 2017-12-31 ENCOUNTER — Ambulatory Visit: Payer: Medicare Other | Admitting: Family Medicine

## 2017-12-31 ENCOUNTER — Encounter: Payer: Self-pay | Admitting: Family Medicine

## 2017-12-31 VITALS — BP 145/75 | HR 62 | Ht 61.0 in | Wt 189.0 lb

## 2017-12-31 DIAGNOSIS — M5416 Radiculopathy, lumbar region: Secondary | ICD-10-CM | POA: Diagnosis not present

## 2017-12-31 DIAGNOSIS — M25641 Stiffness of right hand, not elsewhere classified: Secondary | ICD-10-CM | POA: Diagnosis not present

## 2017-12-31 DIAGNOSIS — M545 Low back pain: Secondary | ICD-10-CM | POA: Diagnosis not present

## 2017-12-31 MED ORDER — CELECOXIB 100 MG PO CAPS: 100.0000 mg | ORAL_CAPSULE | Freq: Two times a day (BID) | ORAL | 1 refills | Status: DC

## 2017-12-31 NOTE — Patient Instructions (Addendum)
Thank you for coming in today.  Attend hand therapy.  Recheck in about 6 weeks.   Return sooner if needed.   Get xray now of you back.

## 2017-12-31 NOTE — Progress Notes (Signed)
Shelley Perez is a 67 y.o. female who presents to [facility] today for follow-up hand pain.  Shelley Perez was seen previously for right hand she was thought to have osteoarthritis but she did have some symptoms concerning for rheumatologic disease and was referred to rheumatology.  Fortunately work-up indicated that this is very unlikely to be any rheumatologic disease.  She is been doing quite well with 100 mg of Celebrex twice daily.  She notes some continued stiffness and swelling and pain especially at her right hand second and third MCP and PIP.  She has not been using the diclofenac gel because she thought she could not take it with Celebrex.  She does note some stiffness that does interfere with her ability to take care of herself at home.  Additionally she notes occasional pain radiating down her left leg.  She will note pain radiating across the knee to the medial ankle and foot.  She describes the pain as burning and stinging.  She notes the pain will occur at rest and with activity.  She notes is intermittent and moderate.  She denies any injury bowel or bladder dysfunction or weakness or numbness.    ROS:  As above  Exam:  BP (!) 145/75   Pulse 62   Ht 5\' 1"  (1.549 m)   Wt 189 lb (85.7 kg)   BMI 35.71 kg/m  General: Well Developed, well nourished, and in no acute distress.  Neuro/Psych: Alert and oriented x3, extra-ocular muscles intact, able to move all 4 extremities, sensation grossly intact. Skin: Warm and dry, no rashes noted.  Respiratory: Not using accessory muscles, speaking in full sentences, trachea midline.  Cardiovascular: Pulses palpable, no extremity edema. Abdomen: Does not appear distended. MSK:  Right hand slightly swollen with some synovitis at the MCPs and more predominantly at the [facility] throughout.  Decreased flexion range of motion Especially digits 2 and 3. No severe ulnar deviation.   L-spine: Nontender to  midline.  Normal back motion.  Negative slump test bilaterally.  Reflex sensation and strength are intact and equal bilateral lower extremities.  X-ray L-spine personal independent review of images: Diffuse mild to moderate DDD with no acute fractures present. L4-L5 does not show severe neuroforaminal stenosis or disc narrowing Awaiting formal radiology review.    Assessment and Plan: 67 y.o. female with  Right hand pain and swelling.  Osteoarthritis.  Discussed treatment options.  Plan to continue Celebrex and use diclofenac gel at the same time.  Additionally will refer to hand therapy as this is likely to be beneficial in the situation.  Recheck in about 6 weeks.  At that point injections are reasonable at MCP or PIP 2 and 3.  Leg pain: Patient describes left leg pain consistent with L4 nerve root impingement.  We discussed options.  His symptoms are mild and intermittent most of the time.  Plan for watchful waiting.  Formal x-ray review pending.  Recheck in about 6 weeks    Orders Placed This Encounter  Procedures  . DG Lumbar Spine Complete    Standing Status:   Future    Number of Occurrences:   1    Standing Expiration Date:   03/03/2019    Order Specific Question:   Reason for Exam (SYMPTOM  OR DIAGNOSIS REQUIRED)    Answer:   eval poss left L4 radiculopathy    Order Specific Question:   Preferred imaging location?    Answer:   [facility]  Order Specific Question:   Radiology Contrast Protocol - do NOT remove file path    Answer:   \\[email or web address]\[email or web address].pdf  . Ambulatory referral to Occupational Therapy    Referral Priority:   Routine    Referral Type:   Occupational Therapy    Referral Reason:   Specialty Services Required    Requested Specialty:   Occupational Therapy    Number of Visits Requested:   1   Meds ordered this encounter  Medications  . celecoxib (CELEBREX) 100 MG capsule    Sig: Take 1 capsule (100 mg total) by  mouth 2 (two) times daily.    Dispense:  120 capsule    Refill:  1    Historical information moved to improve visibility of documentation.  Past Medical History:  Diagnosis Date  . Anemia   . Anxiety   . Arthritis   . Asthma   . Complication of anesthesia    "they gave me too much and I had to stay 3 days " 2005 after choley   . COPD (chronic obstructive pulmonary disease) ([place])   . Depression   . Fibromyalgia   . History of kidney stones   . Hx of pancreatitis    with gallstones  . Hyperlipidemia   . Insomnia   . Osteoporosis    Past Surgical History:  Procedure Laterality Date  . c sections    . CHOLECYSTECTOMY    . DENTAL SURGERY    . FOOT SURGERY    . KNEE SURGERY  1978 / 1971  . SHOULDER SURGERY    . 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 Smoking status: Former Smoker    Packs/day: 1.00    Years: 40.00    Pack years: 40.00    Types: Cigarettes    Last attempt to quit: 06/28/2013    Years since quitting: 4.5  . Smokeless tobacco: Never Used  Substance Use Topics  . Alcohol use: No    Alcohol/week: 0.0 oz   family history includes Asthma in her sister; Cancer in her father; Heart attack in her mother; Hyperlipidemia in her mother; Hypertension in her mother; Lung cancer in her sister.  Medications: Current Outpatient Medications  Medication Sig Dispense Refill  . aspirin 81 MG tablet Take 81 mg by mouth daily.    . budesonide-formoterol (SYMBICORT) 80-4.5 MCG/ACT inhaler Take 2 puffs first thing in am and then another 2 puffs about 12 hours later. 1 Inhaler 3  . celecoxib (CELEBREX) 100 MG capsule Take 1 capsule (100 mg total) by mouth 2 (two) times daily. 120 capsule 1  . Cholecalciferol 2000 units TABS Take 1 tablet (2,000 Units total) by mouth daily. 90 tablet 1  . cyanocobalamin 2000  MCG tablet Take 1 tablet (2,000 mcg total) by mouth daily. 90 tablet 1  . diclofenac sodium (VOLTAREN) 1 % GEL Apply 1 application topically 4 (four) times daily as needed.    . famotidine (PEPCID) 20 MG tablet Take 20 mg by mouth 2 (two) times daily.    . furosemide (LASIX) 20 MG tablet TAKE 1 TABLET(20 MG) BY MOUTH TWICE DAILY 60 tablet 2  . ipratropium-albuterol (DUONEB) 0.5-2.5 (3) MG/3ML SOLN Up to one every 4 hours as needed    . linaclotide (LINZESS) 145 MCG CAPS capsule Take 1 capsule (145 mcg total) by mouth  daily before breakfast. (Patient taking differently: Take 145 mcg by mouth daily as needed. ) 90 capsule 3  . Misc. Devices (ACAPELLA) MISC Use as needed.    . potassium chloride (K-DUR) 10 MEQ tablet TAKE 1 TABLET BY MOUTH DAILY 30 tablet 2  . sertraline (ZOLOFT) 100 MG tablet Take 1 tablet (100 mg total) by mouth daily. 90 tablet 3  . valACYclovir (VALTREX) 1000 MG tablet Take 1 tablet (1,000 mg total) by mouth 3 (three) times daily. 21 tablet 0   No current facility-administered medications for this visit.    Allergies  Allergen Reactions  . Rosuvastatin Calcium     Other reaction(s): Other Muscle and joint pain  . Atorvastatin     Muscle and joint pain  . Codeine Nausea Only and Other (See Comments)    Stomach pains  . Crestor [Rosuvastatin]     Muscle and joint pain  . Prozac [Fluoxetine Hcl] Other (See Comments)    Changes her personality  . Latex Rash  . Sulfa Antibiotics Rash      Discussed warning signs or symptoms. Please see discharge instructions. Patient expresses understanding.

## 2018-01-01 ENCOUNTER — Encounter: Payer: Self-pay | Admitting: Family Medicine

## 2018-01-01 DIAGNOSIS — K861 Other chronic pancreatitis: Secondary | ICD-10-CM | POA: Insufficient documentation

## 2018-01-04 ENCOUNTER — Telehealth: Payer: Self-pay | Admitting: Internal Medicine

## 2018-01-04 NOTE — Telephone Encounter (Signed)
Copied from [place] [id number]. Topic: Quick Communication - See Telephone Encounter >> Jan 04, 2018  9:52 AM Antonieta Iba C wrote: CRM for notification. See Telephone encounter for: 01/04/18.   Pt says that she was seen by Dr. Dorena Bodo from the [facility] and was told that she has a inflamed panaris, pt would like to be advised further on what she should do about this.     CB: [phone]   **Would you like for patient to set up an appointment?

## 2018-01-04 NOTE — Telephone Encounter (Signed)
Pt should follow up with [facility].

## 2018-01-04 NOTE — Telephone Encounter (Signed)
Patient states how is she suppose to follow up with [facility] for a inflamed panaris. She states they informed her she would need to see someone else..   I advised her I could set up an appointment with Dr.Jones but she refused at this time.

## 2018-01-05 ENCOUNTER — Other Ambulatory Visit (INDEPENDENT_AMBULATORY_CARE_PROVIDER_SITE_OTHER): Payer: Medicare Other

## 2018-01-05 ENCOUNTER — Ambulatory Visit (INDEPENDENT_AMBULATORY_CARE_PROVIDER_SITE_OTHER): Payer: Medicare Other | Admitting: Family

## 2018-01-05 ENCOUNTER — Encounter: Payer: Self-pay | Admitting: Family

## 2018-01-05 ENCOUNTER — Ambulatory Visit: Payer: Medicare Other

## 2018-01-05 VITALS — BP 130/82 | HR 76 | Temp 97.6°F | Ht 61.0 in | Wt 191.8 lb

## 2018-01-05 DIAGNOSIS — K861 Other chronic pancreatitis: Secondary | ICD-10-CM

## 2018-01-05 LAB — COMPREHENSIVE METABOLIC PANEL
ALBUMIN: 4 g/dL (ref 3.5–5.2)
ALK PHOS: 84 U/L (ref 39–117)
ALT: 14 U/L (ref 0–35)
AST: 13 U/L (ref 0–37)
BUN: 21 mg/dL (ref 6–23)
CALCIUM: 9.4 mg/dL (ref 8.4–10.5)
CHLORIDE: 105 meq/L (ref 96–112)
CO2: 29 mEq/L (ref 19–32)
Creatinine, Ser: 0.91 mg/dL (ref 0.40–1.20)
GFR: 65.49 mL/min (ref >60.00)
GLUCOSE: 97 mg/dL (ref 70–99)
POTASSIUM: 3.8 meq/L (ref 3.5–5.1)
Sodium: 142 mEq/L (ref 135–145)
TOTAL PROTEIN: 7 g/dL (ref 6.0–8.3)
Total Bilirubin: 0.2 mg/dL (ref 0.2–1.2)

## 2018-01-05 LAB — LIPASE: LIPASE: 28 U/L (ref 11.0–59.0)

## 2018-01-05 LAB — AMYLASE: AMYLASE: 57 U/L (ref 27–131)

## 2018-01-05 NOTE — Progress Notes (Signed)
Shelley Perez is a 67 y.o. female with the following history as recorded in EpicCare:  Patient Active Problem List   Diagnosis Date Noted  . Chronic calcific pancreatitis ([place]) 01/01/2018  . Multiple fractures of ribs, left side, subsequent encounter for fracture with routine healing 12/14/2017  . 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 Constipation 01/21/2017  . Hyperlipidemia LDL goal <130 01/21/2017  . Thrombocytosis ([place]) 01/21/2017  . Panic attacks 01/01/2017  . Neuralgia of left lower extremity 06/05/2016  . Gastroesophageal reflux disease with esophagitis 01/29/2016  . Osteoporosis 01/29/2016  . Vitamin D deficiency 01/29/2016  . Menopause ovarian failure 01/29/2016  . Upper airway cough syndrome 12/12/2015  . Abnormal CT of the chest 11/22/2015  . COPD with acute exacerbation ([place]) 11/21/2015  . Class 2 obesity with body mass index (BMI) of 36.0 to 36.9 in adult 07/11/2015    Current Outpatient Medications  Medication Sig Dispense Refill  . albuterol (PROVENTIL HFA) 108 (90 Base) MCG/ACT inhaler Inhale into the lungs.    Marland Kitchen aspirin 81 MG tablet Take 81 mg by mouth daily.    . budesonide-formoterol (SYMBICORT) 80-4.5 MCG/ACT inhaler Take 2 puffs first thing in am and then another 2 puffs about 12 hours later. 1 Inhaler 3  . celecoxib (CELEBREX) 100 MG capsule Take 1 capsule (100 mg total) by mouth 2 (two) times daily. 120 capsule 1  . Cholecalciferol 2000 units TABS Take 1 tablet (2,000 Units total) by mouth daily. 90 tablet 1  . cyanocobalamin 2000 MCG tablet Take 1 tablet (2,000 mcg total) by mouth daily. 90 tablet 1  . diclofenac sodium (VOLTAREN) 1 % GEL Apply 1 application topically 4 (four) times daily as needed.    . famotidine (PEPCID) 20 MG tablet Take 20 mg by mouth 2 (two) times daily.    . furosemide (LASIX) 20 MG tablet TAKE 1 TABLET(20 MG) BY MOUTH TWICE DAILY 60 tablet 2  . ipratropium-albuterol (DUONEB) 0.5-2.5 (3) MG/3ML SOLN Up to one every 4 hours as needed    .  linaclotide (LINZESS) 145 MCG CAPS capsule Take 1 capsule (145 mcg total) by mouth daily before breakfast. (Patient taking differently: Take 145 mcg by mouth daily as needed. ) 90 capsule 3  . Misc. Devices (ACAPELLA) MISC Use as needed.    . potassium chloride (K-DUR) 10 MEQ tablet TAKE 1 TABLET BY MOUTH DAILY 30 tablet 2  . sertraline (ZOLOFT) 100 MG tablet Take 1 tablet (100 mg total) by mouth daily. 90 tablet 3  . valACYclovir (VALTREX) 1000 MG tablet Take 1 tablet (1,000 mg total) by mouth 3 (three) times daily. 21 tablet 0   No current facility-administered medications for this visit.     Allergies: Rosuvastatin calcium; Atorvastatin; Codeine; Crestor [rosuvastatin]; Prozac [fluoxetine hcl]; Latex; and Sulfa antibiotics  Past Medical History:  Diagnosis Date  . Anemia   . Anxiety   . Arthritis   . Asthma   . Complication of anesthesia    "they gave me too much and I had to stay 3 days " 2005 after choley   . COPD (chronic obstructive pulmonary disease) ([place])   . Depression   . Fibromyalgia   . History of kidney stones   . Hx of pancreatitis    with gallstones  . Hyperlipidemia   . Insomnia   . Osteoporosis     Past Surgical History:  Procedure Laterality Date  . c sections    . CHOLECYSTECTOMY    . DENTAL SURGERY    . FOOT  SURGERY    . KNEE SURGERY  1978 / 1971  . SHOULDER SURGERY    . TOTAL KNEE ARTHROPLASTY Right 07/10/2015   Procedure: RIGHT TOTAL KNEE ARTHROPLASTY;  Surgeon: Paralee Cancel, MD;  Location: [facility];  Service: Orthopedics;  Laterality: Right;    Family History  Problem Relation Age of Onset  . Hyperlipidemia Mother   . Hypertension Mother   . Heart attack Mother   . Cancer Father   . Asthma Sister   . Lung cancer Sister        smoked    Social History   Tobacco Use  . Smoking status: Former Smoker    Packs/day: 1.00    Years: 40.00    Pack years: 40.00    Types: Cigarettes    Last attempt to quit: 06/28/2013    Years since quitting: 4.5   . Smokeless tobacco: Never Used  Substance Use Topics  . Alcohol use: No    Alcohol/week: 0.0 oz    Subjective:  Patient was recently found to have chronic calcific pancreatitis on recent lumbar X-ray; notes that she does have chronic abdominal pain and fatigue- notes that pain is localized in the center of her stomach and feels like reflux however; she did have acute pancreatitis in the past due to cholecystitis and kidney stone; she has no other known history of pancreatitis; she denies any nausea, night sweats or unexplained weight loss;   Objective:  Vitals:   01/05/18 1545  BP: 130/82  Pulse: 76  Temp: 97.6 F (36.4 C)  TempSrc: Oral  SpO2: 94%  Weight: 191 lb 12.8 oz (87 kg)  Height: 5' 1"  (1.549 m)    General: Well developed, well nourished, in no acute distress  Skin : Warm and dry.  Head: Normocephalic and atraumatic  Lungs: Respirations unlabored; clear to auscultation bilaterally without wheeze, rales, rhonchi  Neurologic: Alert and oriented; speech intact; face symmetrical; moves all extremities well; CNII-XII intact without focal deficit   Limited physical exam- majority of visit spent in counseling  Assessment:  1. Chronic calcific pancreatitis (HCC)     Plan:  Update abdominal MRI to better visualize her pancreas; check CMP, amylase, lipase; follow-up to be determined;  I did ask patient to consider sleep apnea evaluation due to her chronic fatigue- she defers at this time but will consider at a later date.   No follow-ups on file.  Orders Placed This Encounter  Procedures  . MR Abdomen W Wo Contrast    Standing Status:   Future    Standing Expiration Date:   03/08/2019    Order Specific Question:   If indicated for the ordered procedure, I authorize the administration of contrast media per Radiology protocol    Answer:   Yes    Order Specific Question:   What is the patient's sedation requirement?    Answer:   No Sedation    Order Specific Question:    Does the patient have a pacemaker or implanted devices?    Answer:   No    Order Specific Question:   Radiology Contrast Protocol - do NOT remove file path    Answer:   \\[email or web address]    Order Specific Question:   Preferred imaging location?    Answer:   [facility] (table limit-550lbs)  . Amylase    Standing Status:   Future    Number of Occurrences:   1    Standing Expiration Date:   01/05/2019  . Lipase  Standing Status:   Future    Number of Occurrences:   1    Standing Expiration Date:   01/05/2019  . Comp Met (CMET)    Standing Status:   Future    Number of Occurrences:   1    Standing Expiration Date:   01/05/2019    Requested Prescriptions    No prescriptions requested or ordered in this encounter

## 2018-01-05 NOTE — Telephone Encounter (Signed)
Contacted pt and informed that coming for an appt is the right thing to do and that Mickel Baas will be able to give the orders needed for treating the pancreatitis.

## 2018-01-06 NOTE — Telephone Encounter (Signed)
Patient was seen on 01/05/18 by Jodi Mourning.

## 2018-01-08 NOTE — Telephone Encounter (Signed)
Called [facility] to let them know she was asking about her MRI and they will reach out to pt. Called pt and she is aware. I also gave her their phone # just in case she would like to call.

## 2018-01-08 NOTE — Telephone Encounter (Signed)
Spoke with patient today to let her know test needed to be approved and once approved she would be contacted for appointment.  Are you able to give me an update for her MRI request?  Thanks

## 2018-01-08 NOTE — Telephone Encounter (Signed)
Patient called back today to ask about the status of a radiology exam that she was going to be schedule for. Please call patient back, thanks.

## 2018-01-12 NOTE — Telephone Encounter (Signed)
Thanks for being so Awesome Lori~

## 2018-01-17 ENCOUNTER — Ambulatory Visit
Admission: RE | Admit: 2018-01-17 | Discharge: 2018-01-17 | Disposition: A | Discharge status: Home / Self Care | Payer: Medicare Other | Source: Ambulatory Visit | Attending: Family | Admitting: Family

## 2018-01-17 DIAGNOSIS — K449 Diaphragmatic hernia without obstruction or gangrene: Secondary | ICD-10-CM | POA: Diagnosis not present

## 2018-01-17 DIAGNOSIS — K861 Other chronic pancreatitis: Secondary | ICD-10-CM

## 2018-01-17 MED ORDER — GADOBENATE DIMEGLUMINE 529 MG/ML IV SOLN
19.0000 mL | Freq: Once | INTRAVENOUS | Status: AC | PRN
  Administered 2018-01-17: 19 mL via INTRAVENOUS

## 2018-01-20 ENCOUNTER — Telehealth: Payer: Self-pay | Admitting: Internal Medicine

## 2018-01-20 MED ORDER — OMEPRAZOLE 20 MG PO CPDR: 20.0000 mg | DELAYED_RELEASE_CAPSULE | Freq: Every day | ORAL | 3 refills | Status: DC

## 2018-01-20 NOTE — Telephone Encounter (Signed)
Copied from [place] [id number]. Topic: Quick Communication - Office Called Patient >> Jan 19, 2018  4:58 PM Marcina Millard, CMA wrote: Reason for CRM: Tried to call patient but her voicemail had not been set up. If she returns call to clinic okay to release the following to her:  Per Mickel Baas her pancreas is normal. She does have a small hiatal hernia- if she is having a lot of GERD symptoms, could try something stronger than her Pepcid. Some atherosclerosis was seen in the aorta- It would be good for her to get an updated lipid panel. She probably needs to take some type of cholesterol medication as well. She may contact our office with any questions or concerns. If she is willing to proceed with cholesterol medication please let me know so Mickel Baas can address.

## 2018-01-20 NOTE — Telephone Encounter (Signed)
Please address. Thanks!

## 2018-01-20 NOTE — Telephone Encounter (Signed)
Patient notified of her results: She is willing to try something different for GERD- she doesn't want anything too expensive.  Patient has been on cholesterol medications before and she can not tolerate them. Discussed blood work and fish oil as supplement. Dr Melvyn Novas has warned her about oil based medications and GERD. She wants to know if she can wait until her CPE for lipid panel. Please let her know.

## 2018-01-20 NOTE — Telephone Encounter (Signed)
Yes, she can wait until her CPE in July with Dr. Ronnald Ramp for lipid panel; Will call in medication for GERD- she can discuss with him at her follow-up as well.

## 2018-01-21 NOTE — Telephone Encounter (Signed)
Spoke with patient and info given 

## 2018-01-27 ENCOUNTER — Other Ambulatory Visit: Payer: Self-pay | Admitting: Internal Medicine

## 2018-01-27 DIAGNOSIS — K5900 Constipation, unspecified: Secondary | ICD-10-CM

## 2018-02-16 ENCOUNTER — Other Ambulatory Visit (INDEPENDENT_AMBULATORY_CARE_PROVIDER_SITE_OTHER): Payer: Medicare Other

## 2018-02-16 ENCOUNTER — Encounter: Payer: Self-pay | Admitting: Internal Medicine

## 2018-02-16 ENCOUNTER — Ambulatory Visit (INDEPENDENT_AMBULATORY_CARE_PROVIDER_SITE_OTHER): Payer: Medicare Other | Admitting: Internal Medicine

## 2018-02-16 VITALS — BP 134/74 | HR 66 | Temp 98.7°F | Resp 16 | Ht 61.0 in | Wt 195.2 lb

## 2018-02-16 DIAGNOSIS — E785 Hyperlipidemia, unspecified: Secondary | ICD-10-CM | POA: Diagnosis not present

## 2018-02-16 DIAGNOSIS — D473 Essential (hemorrhagic) thrombocythemia: Secondary | ICD-10-CM

## 2018-02-16 DIAGNOSIS — E559 Vitamin D deficiency, unspecified: Secondary | ICD-10-CM

## 2018-02-16 DIAGNOSIS — K2100 Gastro-esophageal reflux disease with esophagitis, without bleeding: Secondary | ICD-10-CM

## 2018-02-16 DIAGNOSIS — I700 Atherosclerosis of aorta: Secondary | ICD-10-CM | POA: Diagnosis not present

## 2018-02-16 DIAGNOSIS — D75839 Thrombocytosis, unspecified: Secondary | ICD-10-CM

## 2018-02-16 DIAGNOSIS — Z0001 Encounter for general adult medical examination with abnormal findings: Secondary | ICD-10-CM

## 2018-02-16 DIAGNOSIS — F418 Other specified anxiety disorders: Secondary | ICD-10-CM | POA: Diagnosis not present

## 2018-02-16 DIAGNOSIS — Z0000 Encounter for general adult medical examination without abnormal findings: Secondary | ICD-10-CM

## 2018-02-16 LAB — TSH: TSH: 2.74 u[IU]/mL (ref 0.35–4.50)

## 2018-02-16 LAB — LIPID PANEL
Cholesterol: 224 mg/dL — ABNORMAL HIGH (ref 0–200)
HDL: 52.3 mg/dL (ref >39.00)
NonHDL: 172.03
Total CHOL/HDL Ratio: 4
Triglycerides: 381 mg/dL — ABNORMAL HIGH (ref 0.0–149.0)
VLDL: 76.2 mg/dL — ABNORMAL HIGH (ref 0.0–40.0)

## 2018-02-16 LAB — COMPREHENSIVE METABOLIC PANEL
ALT: 21 U/L (ref 0–35)
AST: 19 U/L (ref 0–37)
Albumin: 4 g/dL (ref 3.5–5.2)
Alkaline Phosphatase: 92 U/L (ref 39–117)
BUN: 16 mg/dL (ref 6–23)
CO2: 29 mEq/L (ref 19–32)
Calcium: 8.9 mg/dL (ref 8.4–10.5)
Chloride: 105 mEq/L (ref 96–112)
Creatinine, Ser: 0.67 mg/dL (ref 0.40–1.20)
GFR: 93.21 mL/min (ref >60.00)
Glucose, Bld: 102 mg/dL — ABNORMAL HIGH (ref 70–99)
Potassium: 3.9 mEq/L (ref 3.5–5.1)
Sodium: 140 mEq/L (ref 135–145)
Total Bilirubin: 0.3 mg/dL (ref 0.2–1.2)
Total Protein: 6.8 g/dL (ref 6.0–8.3)

## 2018-02-16 LAB — CBC WITH DIFFERENTIAL/PLATELET
BASOS ABS: 0.1 10*3/uL (ref 0.0–0.1)
BASOS PCT: 1 % (ref 0.0–3.0)
EOS ABS: 0.4 10*3/uL (ref 0.0–0.7)
Eosinophils Relative: 4.6 % (ref 0.0–5.0)
HEMATOCRIT: 35.8 % — AB (ref 36.0–46.0)
HEMOGLOBIN: 12.1 g/dL (ref 12.0–15.0)
LYMPHS PCT: 34.1 % (ref 12.0–46.0)
Lymphs Abs: 3 10*3/uL (ref 0.7–4.0)
MCHC: 33.9 g/dL (ref 30.0–36.0)
MCV: 89.3 fl (ref 78.0–100.0)
MONO ABS: 0.8 10*3/uL (ref 0.1–1.0)
Monocytes Relative: 9.6 % (ref 3.0–12.0)
Neutro Abs: 4.4 10*3/uL (ref 1.4–7.7)
Neutrophils Relative %: 50.7 % (ref 43.0–77.0)
Platelets: 445 10*3/uL — ABNORMAL HIGH (ref 150.0–400.0)
RBC: 4.01 Mil/uL (ref 3.87–5.11)
RDW: 13.5 % (ref 11.5–15.5)
WBC: 8.7 10*3/uL (ref 4.0–10.5)

## 2018-02-16 LAB — VITAMIN D 25 HYDROXY (VIT D DEFICIENCY, FRACTURES): VITD: 25.74 ng/mL — ABNORMAL LOW (ref 30.00–100.00)

## 2018-02-16 LAB — LDL CHOLESTEROL, DIRECT: Direct LDL: 120 mg/dL

## 2018-02-16 MED ORDER — SERTRALINE HCL 100 MG PO TABS: 100.0000 mg | ORAL_TABLET | Freq: Every day | ORAL | 1 refills | Status: DC

## 2018-02-16 MED ORDER — PITAVASTATIN CALCIUM 2 MG PO TABS: 1.0000 | ORAL_TABLET | Freq: Every day | ORAL | 1 refills | Status: DC

## 2018-02-16 NOTE — Patient Instructions (Signed)

## 2018-02-16 NOTE — Progress Notes (Signed)
Subjective:  Patient ID: Shelley Perez, female    DOB: 28-Sep-1950  Age: 67 y.o. MRN: [id number]  CC: Annual Exam and Hyperlipidemia   HPI [name] presents for a CPX.  She recently had an x-ray done and she is concerned that there was evidence of atherosclerosis.  She wants to know what she can do to prevent complications from this.  She previously tried a statin and had muscle aches.  She has an open mind about trying a new and different statin.  Her sister died 3 months ago and since then she has been having crying spells, anhedonia, weight gain, eating too much food, and having trouble sleeping.  He wants to restart Zoloft.  Past Medical History:  Diagnosis Date  . Anemia   . Anxiety   . Arthritis   . Asthma   . Complication of anesthesia    "they gave me too much and I had to stay 3 days " 2005 after choley   . COPD (chronic obstructive pulmonary disease) ([place])   . Depression   . Fibromyalgia   . History of kidney stones   . Hx of pancreatitis    with gallstones  . Hyperlipidemia   . Insomnia   . Osteoporosis    Past Surgical History:  Procedure Laterality Date  . c sections    . CHOLECYSTECTOMY    . DENTAL SURGERY    . FOOT SURGERY    . KNEE SURGERY  1978 / 1971  . SHOULDER SURGERY    . TOTAL KNEE ARTHROPLASTY Right 07/10/2015   Procedure: RIGHT TOTAL KNEE ARTHROPLASTY;  Surgeon: Paralee Cancel, MD;  Location: [facility];  Service: Orthopedics;  Laterality: Right;    reports that she quit smoking about 4 years ago. Her smoking use included cigarettes. She has a 40.00 pack-year smoking history. She has never used smokeless tobacco. She reports that she does not drink alcohol or use drugs. family history includes Asthma in her sister; Cancer in her father; Heart attack in her mother; Hyperlipidemia in her mother; Hypertension in her mother; Lung cancer in her sister. Allergies  Allergen Reactions  . Rosuvastatin Calcium     Other reaction(s): Other Muscle and  joint pain  . Atorvastatin     Muscle and joint pain  . Codeine Nausea Only and Other (See Comments)    Stomach pains  . Crestor [Rosuvastatin]     Muscle and joint pain  . Prozac [Fluoxetine Hcl] Other (See Comments)    Changes her personality  . Latex Rash  . Sulfa Antibiotics Rash    Outpatient Medications Prior to Visit  Medication Sig Dispense Refill  . albuterol (PROVENTIL HFA) 108 (90 Base) MCG/ACT inhaler Inhale into the lungs.    Marland Kitchen aspirin 81 MG tablet Take 81 mg by mouth daily.    . budesonide-formoterol (SYMBICORT) 80-4.5 MCG/ACT inhaler Take 2 puffs first thing in am and then another 2 puffs about 12 hours later. 1 Inhaler 3  . celecoxib (CELEBREX) 100 MG capsule Take 1 capsule (100 mg total) by mouth 2 (two) times daily. 120 capsule 1  . Cholecalciferol 2000 units TABS Take 1 tablet (2,000 Units total) by mouth daily. 90 tablet 1  . cyanocobalamin 2000 MCG tablet Take 1 tablet (2,000 mcg total) by mouth daily. 90 tablet 1  . diclofenac sodium (VOLTAREN) 1 % GEL Apply 1 application topically 4 (four) times daily as needed.    . famotidine (PEPCID) 20 MG tablet Take 20 mg by mouth 2 (  two) times daily.    . furosemide (LASIX) 20 MG tablet TAKE 1 TABLET(20 MG) BY MOUTH TWICE DAILY 60 tablet 2  . ipratropium-albuterol (DUONEB) 0.5-2.5 (3) MG/3ML SOLN Up to one every 4 hours as needed    . linaclotide (LINZESS) 145 MCG CAPS capsule Take 1 capsule (145 mcg total) by mouth daily as needed. 90 capsule 1  . Misc. Devices (ACAPELLA) MISC Use as needed.    . potassium chloride (K-DUR) 10 MEQ tablet TAKE 1 TABLET BY MOUTH DAILY 30 tablet 2  . valACYclovir (VALTREX) 1000 MG tablet Take 1 tablet (1,000 mg total) by mouth 3 (three) times daily. 21 tablet 0  . omeprazole (PRILOSEC) 20 MG capsule Take 1 capsule (20 mg total) by mouth daily. 30 capsule 3  . sertraline (ZOLOFT) 100 MG tablet Take 1 tablet (100 mg total) by mouth daily. 90 tablet 3   No facility-administered medications prior  to visit.     ROS Review of Systems  Constitutional: Positive for appetite change, fatigue and unexpected weight change. Negative for activity change and diaphoresis.  HENT: Negative.   Eyes: Negative for visual disturbance.  Respiratory: Negative for cough, chest tightness, shortness of breath and wheezing.   Cardiovascular: Negative.  Negative for chest pain, palpitations and leg swelling.  Gastrointestinal: Negative for abdominal pain, constipation, diarrhea, nausea and vomiting.  Genitourinary: Negative.  Negative for decreased urine volume, difficulty urinating and vaginal bleeding.  Musculoskeletal: Negative.  Negative for arthralgias, back pain, myalgias and neck pain.  Skin: Negative.  Negative for color change and pallor.  Allergic/Immunologic: Negative.   Neurological: Negative.  Negative for dizziness, weakness, light-headedness and headaches.  Hematological: Negative for adenopathy. Does not bruise/bleed easily.  Psychiatric/Behavioral: Positive for dysphoric mood and sleep disturbance. Negative for behavioral problems, confusion, decreased concentration, self-injury and suicidal ideas. The patient is nervous/anxious.     Objective:  BP 134/74 (BP Location: Left Arm, Patient Position: Sitting, Cuff Size: Large)   Pulse 66   Temp 98.7 F (37.1 C) (Oral)   Resp 16   Ht 5\' 1"  (1.549 m)   Wt 195 lb 4 oz (88.6 kg)   SpO2 96%   BMI 36.89 kg/m   BP Readings from Last 3 Encounters:  02/16/18 134/74  01/05/18 130/82  12/31/17 (!) 145/75    Wt Readings from Last 3 Encounters:  02/16/18 195 lb 4 oz (88.6 kg)  01/05/18 191 lb 12.8 oz (87 kg)  12/31/17 189 lb (85.7 kg)    Physical Exam  Constitutional: She is oriented to person, place, and time. No distress.  HENT:  Mouth/Throat: Oropharynx is clear and moist. No oropharyngeal exudate.  Eyes: Conjunctivae are normal. No scleral icterus.  Neck: Normal range of motion. Neck supple. No JVD present. No thyromegaly present.   Cardiovascular: Normal rate and regular rhythm. Exam reveals no gallop.  No murmur heard. Pulmonary/Chest: Effort normal and breath sounds normal. She has no wheezes. She has no rales.  Abdominal: Soft. Bowel sounds are normal. She exhibits no mass. There is no tenderness.  Genitourinary:  Genitourinary Comments: Breast, GU, rectal exams were deferred at her request.  Musculoskeletal: Normal range of motion. She exhibits no edema, tenderness or deformity.  Lymphadenopathy:    She has no cervical adenopathy.  Neurological: She is alert and oriented to person, place, and time.  Skin: Skin is warm and dry. She is not diaphoretic. No pallor.  Psychiatric: Her behavior is normal. Judgment normal. Her mood appears anxious. Her affect is not angry.  Her speech is tangential. Her speech is not rapid and/or pressured and not delayed. She is not agitated, not aggressive, not slowed and not withdrawn. Cognition and memory are normal. Cognition and memory are not impaired. She does not express impulsivity. She exhibits a depressed mood. She expresses no homicidal and no suicidal ideation. She exhibits normal recent memory and normal remote memory.  Vitals reviewed.   Lab Results  Component Value Date   WBC 8.7 02/16/2018   HGB 12.1 02/16/2018   HCT 35.8 (L) 02/16/2018   PLT 445.0 (H) 02/16/2018   GLUCOSE 102 (H) 02/16/2018   CHOL 224 (H) 02/16/2018   TRIG 381.0 (H) 02/16/2018   HDL 52.30 02/16/2018   LDLDIRECT 120.0 02/16/2018   LDLCALC 122 (H) 01/21/2017   ALT 21 02/16/2018   AST 19 02/16/2018   NA 140 02/16/2018   K 3.9 02/16/2018   CL 105 02/16/2018   CREATININE 0.67 02/16/2018   BUN 16 02/16/2018   CO2 29 02/16/2018   TSH 2.74 02/16/2018   INR 1.06 06/29/2015   HGBA1C 5.6 01/21/2017    Mr Abdomen W Wo Contrast  Result Date: 01/18/2018 CLINICAL DATA:  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 Abdominal bloating. Weight gain. Prior cholecystectomy.  EXAM: MRI ABDOMEN WITHOUT AND WITH CONTRAST TECHNIQUE: Multiplanar multisequence MR imaging of the abdomen was performed both before and after the administration of intravenous contrast. Creatinine was obtained on site at [facility] at [address] Results: Creatinine 0.7 mg/dL. CONTRAST:  8mL MULTIHANCE GADOBENATE DIMEGLUMINE 529 MG/ML IV SOLN COMPARISON:  12/31/2017 lumbar spine radiographs. FINDINGS: Lower chest: Small fat containing left-sided Bochdalek's hernia. No acute abnormality at the lung bases. Hepatobiliary: Normal liver size and configuration. No significant hepatic steatosis. No liver mass. Cholecystectomy. Bile ducts are within normal post cholecystectomy limits, with common bile duct diameter 6 mm. No choledocholithiasis. Pancreas: No pancreatic mass or duct dilation. No pancreas divisum. Pancreatic parenchymal signal intensity is within normal limits. No peripancreatic fluid collections. Spleen: Normal size. No mass. Adrenals/Urinary Tract: Normal adrenals. No hydronephrosis. Subcentimeter simple renal cyst in the medial lower right kidney. Otherwise normal kidneys, with no suspicious renal masses. Stomach/Bowel: Small hiatal hernia. Otherwise normal nondistended stomach. Visualized small and large bowel is normal caliber, with no bowel wall thickening. Vascular/Lymphatic: Atherosclerotic nonaneurysmal abdominal aorta. Patent portal, splenic, hepatic and renal veins. No pathologically enlarged lymph nodes in the abdomen. Other: No abdominal ascites or focal fluid collection. Musculoskeletal: No aggressive appearing focal osseous lesions. IMPRESSION: 1. No acute abnormality. Normal pancreas, with no evidence of acute pancreatitis. Bile ducts are within normal post cholecystectomy limits. 2. Small hiatal hernia. 3.  Aortic Atherosclerosis (ICD10-I70.0). Electronically Signed   By: Ilona Sorrel M.D.   On: 01/18/2018 08:22    Assessment & Plan:   Florestine was seen today for annual  exam and hyperlipidemia.  Diagnoses and all orders for this visit:  Hyperlipidemia LDL goal <130- She has not achieved her LDL goal and has a mildly elevated ASCVD risk score so I have asked her to try Livalo since she has had trouble with other statins in the past. -     Lipid panel; Future -     Comprehensive metabolic panel; Future -     Pitavastatin Calcium (LIVALO) 2 MG TABS; Take 1 tablet (2 mg total) by mouth daily. -     TSH; Future  Gastroesophageal reflux disease with esophagitis- her sx's are well controlled -     CBC with  Differential/Platelet; Future  Thrombocytosis ([place])- Her platelet count is mildly elevated and she is mildly anemic.  I will bring her back in to screen her for iron and B12 deficiencies. -     CBC with Differential/Platelet; Future  Vitamin D deficiency -     VITAMIN D 25 Hydroxy (Vit-D Deficiency, Fractures); Future  Depression with anxiety -     sertraline (ZOLOFT) 100 MG tablet; Take 1 tablet (100 mg total) by mouth daily.  Atherosclerosis of aorta (HCC) -     Pitavastatin Calcium (LIVALO) 2 MG TABS; Take 1 tablet (2 mg total) by mouth daily.  Routine general medical examination at a health care facility   I have discontinued Roy Ellzey's omeprazole. I am also having her start on Pitavastatin Calcium. Additionally, I am having her maintain her aspirin, ipratropium-albuterol, budesonide-formoterol, famotidine, ACAPELLA, diclofenac sodium, furosemide, potassium chloride, Cholecalciferol, cyanocobalamin, valACYclovir, celecoxib, albuterol, linaclotide, and sertraline.  Meds ordered this encounter  Medications  . sertraline (ZOLOFT) 100 MG tablet    Sig: Take 1 tablet (100 mg total) by mouth daily.    Dispense:  90 tablet    Refill:  1  . Pitavastatin Calcium (LIVALO) 2 MG TABS    Sig: Take 1 tablet (2 mg total) by mouth daily.    Dispense:  90 tablet    Refill:  1   See AVS for instructions about healthy living and anticipatory  guidance.  Follow-up: Return in about 6 months (around 08/19/2018).  Scarlette Calico, MD

## 2018-02-17 ENCOUNTER — Encounter: Payer: Self-pay | Admitting: Internal Medicine

## 2018-02-17 DIAGNOSIS — Z0000 Encounter for general adult medical examination without abnormal findings: Secondary | ICD-10-CM | POA: Insufficient documentation

## 2018-02-17 NOTE — Assessment & Plan Note (Signed)

## 2018-02-23 ENCOUNTER — Telehealth: Payer: Self-pay | Admitting: Internal Medicine

## 2018-02-23 NOTE — Telephone Encounter (Signed)
Pt stated that she has been taking the vitamin d as prescribed and wanted to know if she needs a higher strength of vitamin d.   Pt has been informed of labs and that letter has been mailed.

## 2018-02-23 NOTE — Telephone Encounter (Signed)
Copied from [place] [id number]. Topic: Quick Communication - See Telephone Encounter >> Feb 23, 2018 11:10 AM Mylinda Latina, NT wrote: CRM for notification. See Telephone encounter for: 02/23/18. Patient was calling and inquiring about her lab results. Patient also want her lab results mailed to her .  CB# [phone]

## 2018-02-25 ENCOUNTER — Encounter: Payer: Self-pay | Admitting: Family Medicine

## 2018-02-25 ENCOUNTER — Ambulatory Visit (INDEPENDENT_AMBULATORY_CARE_PROVIDER_SITE_OTHER): Payer: Medicare Other | Admitting: Family Medicine

## 2018-02-25 VITALS — BP 157/58 | HR 65 | Ht 61.0 in | Wt 192.0 lb

## 2018-02-25 DIAGNOSIS — M25641 Stiffness of right hand, not elsewhere classified: Secondary | ICD-10-CM

## 2018-02-25 DIAGNOSIS — M5416 Radiculopathy, lumbar region: Secondary | ICD-10-CM

## 2018-02-25 NOTE — Patient Instructions (Signed)
Thank you for coming in today. Recheck as needed.  Use paraffin wax   Shelley Perez is accepting patients

## 2018-02-25 NOTE — Progress Notes (Signed)
Shelley Perez is a 67 y.o. female who presents to [facility] today for follow-up hand pain and lumbar radicular pain.   Shelley Perez was last seen about 2 months ago.  At that time she was having right hand pain and left leg pain thought to be lumbar radicular pain and hand DJD.  In the interim she has had significant improvement in symptoms.  She is been using diclofenac gel and warm paraffin treatment for her hand which has been significantly helpful.  She does note some continued stiffness in her hands but overall feels much better.  She notes that the radiating pain down her left leg has spontaneously improved.  She feels quite well with how things are going.  She notes that she lives in [place] and her primary care provider is in [place].  She is thinking about finding a primary care doctor in the area.  She is happy with her current doctor but notes that traveling is somewhat difficult for her.    ROS:  As above  Exam:  BP (!) 157/58   Pulse 65   Ht 5\' 1"  (1.549 m)   Wt 192 lb (87.1 kg)   BMI 36.28 kg/m  General: Well Developed, well nourished, and in no acute distress.  Neuro/Psych: Alert and oriented x3, extra-ocular muscles intact, able to move all 4 extremities, sensation grossly intact. Skin: Warm and dry, no rashes noted.  Respiratory: Not using accessory muscles, speaking in full sentences, trachea midline.  Cardiovascular: Pulses palpable, no extremity edema. Abdomen: Does not appear distended. MSK:  Right hand: Continued mild swelling and lack of full extension of her MCPs and PIPs right hand.  Not particularly tender.  Capillary refill and sensation are intact distally.  L-spine: Normal gait.  Lower extremity strength is intact.  Normal trunk motion.      Assessment and Plan: 67 y.o. 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  Plan for continued conservative management.  If not doing well next steps would  be either injection or hand therapy or both.  Recheck or return if needed.  Lumbar radicular pain resolving.  Watchful waiting recheck as needed.  Core strengthening exercises at home.  PCP: Discussed her options.  We do have 2 providers accepting patients in this clinic.  Her current primary care provider Dr. Ronnald Ramp is excellent.  She will will schedule an appointment if needed.     Historical information moved to improve visibility of documentation.  Past Medical History:  Diagnosis Date  . Anemia   . Anxiety   . Arthritis   . Asthma   . Complication of anesthesia    "they gave me too much and I had to stay 3 days " 2005 after choley   . COPD (chronic obstructive pulmonary disease) ([place])   . Depression   . Fibromyalgia   . History of kidney stones   . Hx of pancreatitis    with gallstones  . Hyperlipidemia   . Insomnia   . Osteoporosis    Past Surgical History:  Procedure Laterality Date  . c sections    . CHOLECYSTECTOMY    . DENTAL SURGERY    . FOOT SURGERY    . KNEE SURGERY  1978 / 1971  . SHOULDER SURGERY    . TOTAL KNEE ARTHROPLASTY Right 07/10/2015   Procedure: RIGHT TOTAL KNEE ARTHROPLASTY;  Surgeon: Paralee Cancel, MD;  Location: [facility];  Service: Orthopedics;  Laterality: Right;   Social  History   Tobacco Use  . Smoking status: Former Smoker    Packs/day: 1.00    Years: 40.00    Pack years: 40.00    Types: Cigarettes    Last attempt to quit: 06/28/2013    Years since quitting: 4.6  . Smokeless tobacco: Never Used  Substance Use Topics  . Alcohol use: No    Alcohol/week: 0.0 oz   family history includes Asthma in her sister; Cancer in her father; Heart attack in her mother; Hyperlipidemia in her mother; Hypertension in her mother; Lung cancer in her sister.  Medications: Current Outpatient Medications  Medication Sig Dispense Refill  . albuterol (PROVENTIL HFA) 108 (90 Base) MCG/ACT inhaler Inhale into the lungs.    Marland Kitchen aspirin 81 MG tablet Take 81 mg  by mouth daily.    . budesonide-formoterol (SYMBICORT) 80-4.5 MCG/ACT inhaler Take 2 puffs first thing in am and then another 2 puffs about 12 hours later. 1 Inhaler 3  . celecoxib (CELEBREX) 100 MG capsule Take 1 capsule (100 mg total) by mouth 2 (two) times daily. 120 capsule 1  . Cholecalciferol 2000 units TABS Take 1 tablet (2,000 Units total) by mouth daily. 90 tablet 1  . cyanocobalamin 2000 MCG tablet Take 1 tablet (2,000 mcg total) by mouth daily. 90 tablet 1  . diclofenac sodium (VOLTAREN) 1 % GEL Apply 1 application topically 4 (four) times daily as needed.    . famotidine (PEPCID) 20 MG tablet Take 20 mg by mouth 2 (two) times daily.    . furosemide (LASIX) 20 MG tablet TAKE 1 TABLET(20 MG) BY MOUTH TWICE DAILY 60 tablet 2  . ipratropium-albuterol (DUONEB) 0.5-2.5 (3) MG/3ML SOLN Up to one every 4 hours as needed    . linaclotide (LINZESS) 145 MCG CAPS capsule Take 1 capsule (145 mcg total) by mouth daily as needed. 90 capsule 1  . Misc. Devices (ACAPELLA) MISC Use as needed.    . Pitavastatin Calcium (LIVALO) 2 MG TABS Take 1 tablet (2 mg total) by mouth daily. 90 tablet 1  . potassium chloride (K-DUR) 10 MEQ tablet TAKE 1 TABLET BY MOUTH DAILY 30 tablet 2  . sertraline (ZOLOFT) 100 MG tablet Take 1 tablet (100 mg total) by mouth daily. 90 tablet 1  . valACYclovir (VALTREX) 1000 MG tablet Take 1 tablet (1,000 mg total) by mouth 3 (three) times daily. 21 tablet 0   No current facility-administered medications for this visit.    Allergies  Allergen Reactions  . Rosuvastatin Calcium     Other reaction(s): Other Muscle and joint pain  . Atorvastatin     Muscle and joint pain  . Codeine Nausea Only and Other (See Comments)    Stomach pains  . Crestor [Rosuvastatin]     Muscle and joint pain  . Prozac [Fluoxetine Hcl] Other (See Comments)    Changes her personality  . Latex Rash  . Sulfa Antibiotics Rash      Discussed warning signs or symptoms. Please see discharge  instructions. Patient expresses understanding.

## 2018-03-03 ENCOUNTER — Other Ambulatory Visit: Payer: Self-pay | Admitting: Internal Medicine

## 2018-03-03 DIAGNOSIS — F418 Other specified anxiety disorders: Secondary | ICD-10-CM

## 2018-03-03 DIAGNOSIS — I10 Essential (primary) hypertension: Secondary | ICD-10-CM

## 2018-03-03 MED ORDER — FUROSEMIDE 20 MG PO TABS: ORAL_TABLET | ORAL | 1 refills | Status: DC

## 2018-03-03 MED ORDER — SERTRALINE HCL 100 MG PO TABS: 100.0000 mg | ORAL_TABLET | Freq: Every day | ORAL | 1 refills | Status: DC

## 2018-03-08 ENCOUNTER — Telehealth: Payer: Self-pay

## 2018-03-08 DIAGNOSIS — I10 Essential (primary) hypertension: Secondary | ICD-10-CM

## 2018-03-08 MED ORDER — POTASSIUM CHLORIDE ER 10 MEQ PO TBCR: EXTENDED_RELEASE_TABLET | ORAL | 5 refills | Status: DC

## 2018-03-08 MED ORDER — POTASSIUM CHLORIDE ER 10 MEQ PO TBCR: EXTENDED_RELEASE_TABLET | ORAL | 1 refills | Status: DC

## 2018-03-08 NOTE — Telephone Encounter (Signed)
[facility] sent rx rq for potassium. LOV with PCP was 01/05/2018. Erx sent.

## 2018-03-12 ENCOUNTER — Telehealth: Payer: Self-pay | Admitting: Internal Medicine

## 2018-03-12 ENCOUNTER — Encounter: Payer: Medicare Other | Admitting: Adult Health

## 2018-03-12 NOTE — Telephone Encounter (Signed)
Copied from [place] [id number]. Topic: Quick Communication - See Telephone Encounter >> Mar 12, 2018 11:04 AM Ahmed Prima L wrote: CRM for notification. See Telephone encounter for: 03/12/18.  [facility] called and said that is available in two different forms for potassium chloride (K-DUR) 10 MEQ tablet. Which form do they need to dispense?  [phone] [id number] reference number

## 2018-03-12 NOTE — Telephone Encounter (Signed)
I called pharmacy- Klor Con M 10 meq 1 po qd  will be dispensed as K-DUR form is not available.

## 2018-03-17 IMAGING — CT CT CHEST W/ CM
2 of 4 series · 15 of 36 positions shown, 18 images · IV contrast (APPLIED)
Comparison: Chest x-ray 10/30/2014

CLINICAL DATA: Abnormal chest x-ray.

EXAM:
CT CHEST WITH CONTRAST
TECHNIQUE: Multidetector CT imaging of the chest was performed during
intravenous contrast administration.
CONTRAST:  75mL [id number] IOPAMIDOL ([id number]) INJECTION 61%

[Series 2: chest w/cm · axial · 0.71mm/px · z∈[-131,+134]mm · 12 of 63 slices shown, 15 images]
[im 5/63  mediastinal]
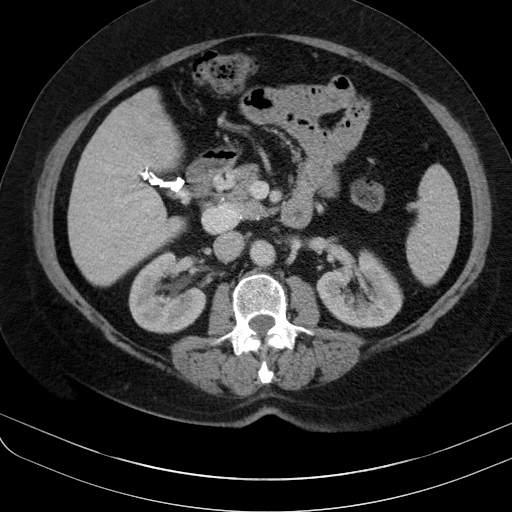
[im 5/63  lung]
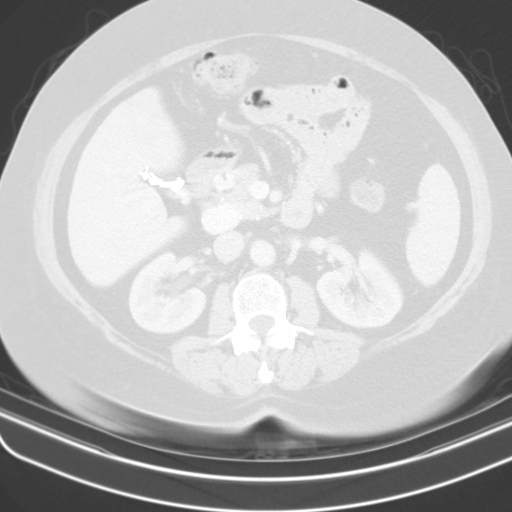
[im 9/63  lung]
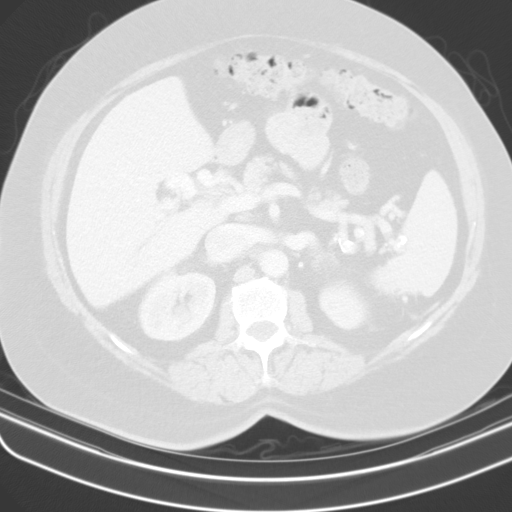
[im 14/63  lung]
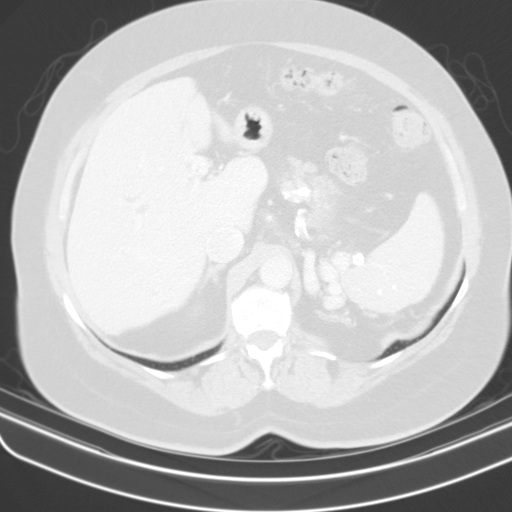
[im 18/63  lung]
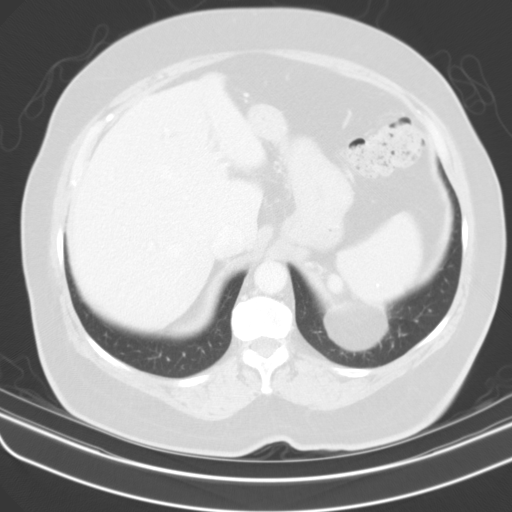
[im 23/63  mediastinal]
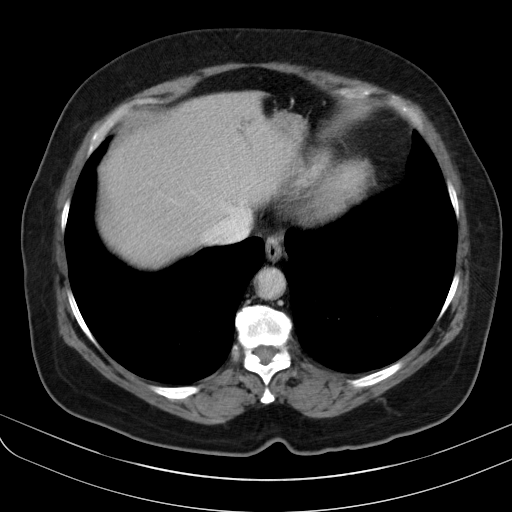
[im 23/63  lung]
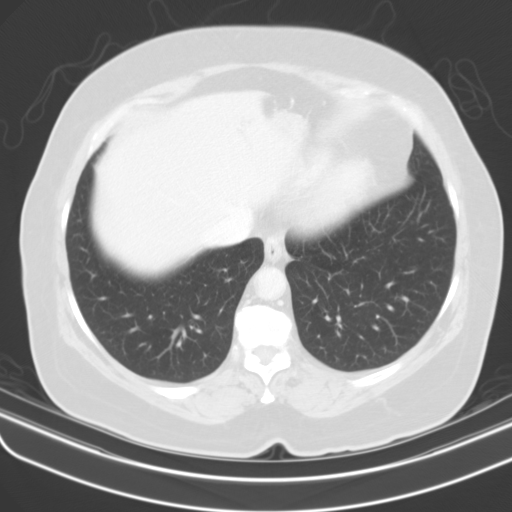
[im 27/63  lung]
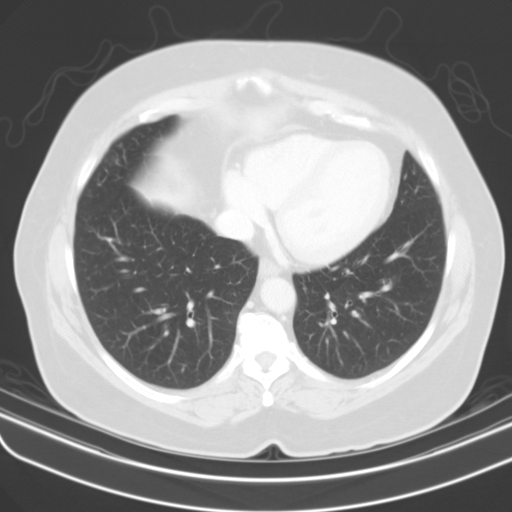
[im 36/63  lung]
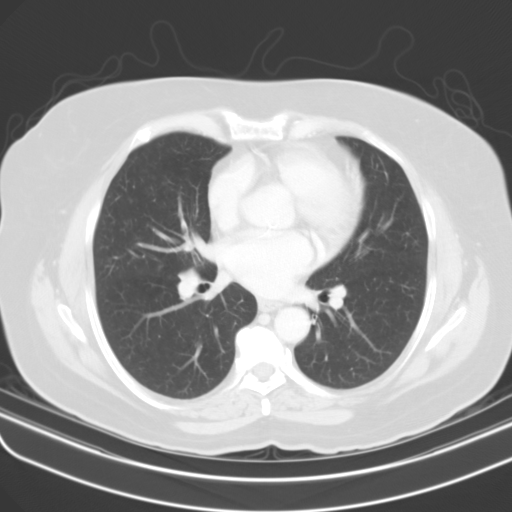
[im 40/63  lung]
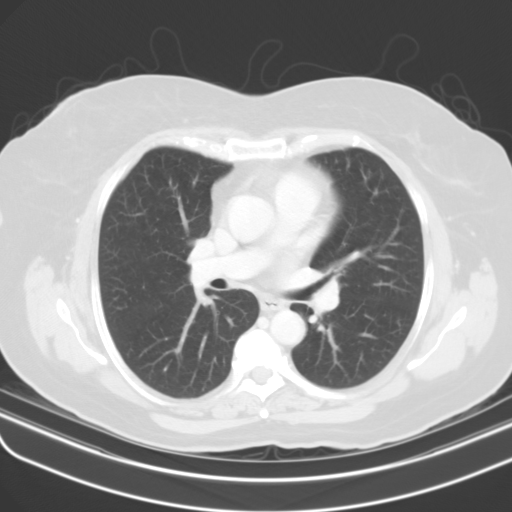
[im 45/63  mediastinal]
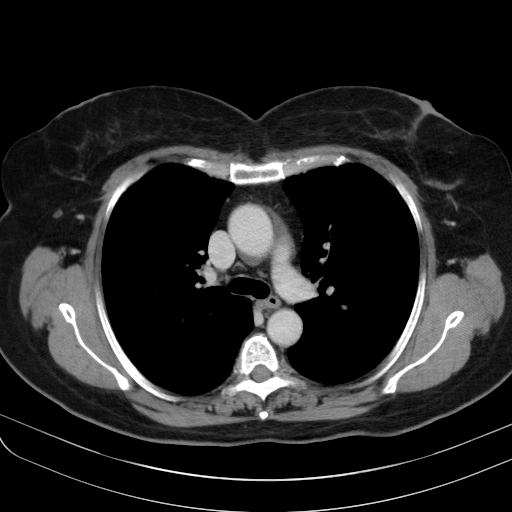
[im 45/63  lung]
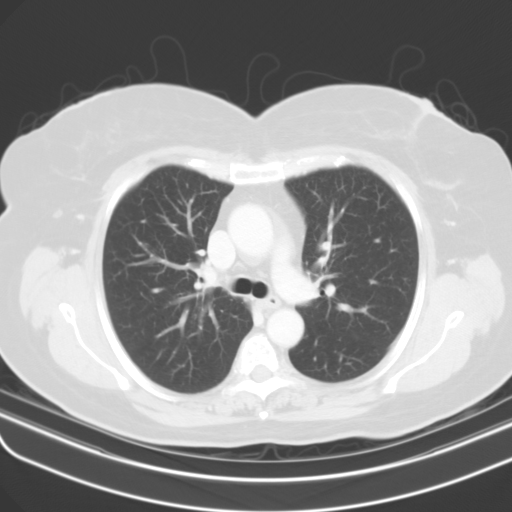
[im 49/63  lung]
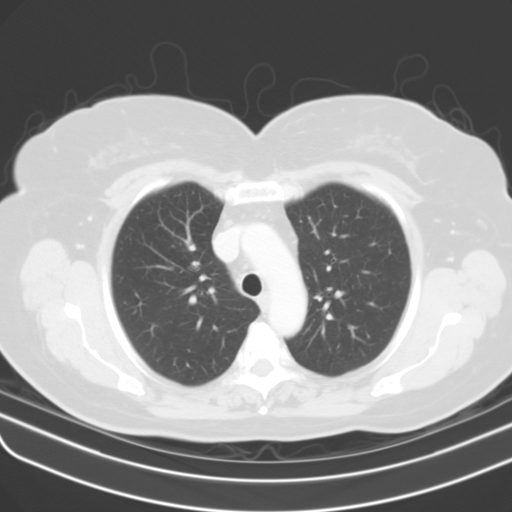
[im 54/63  lung]
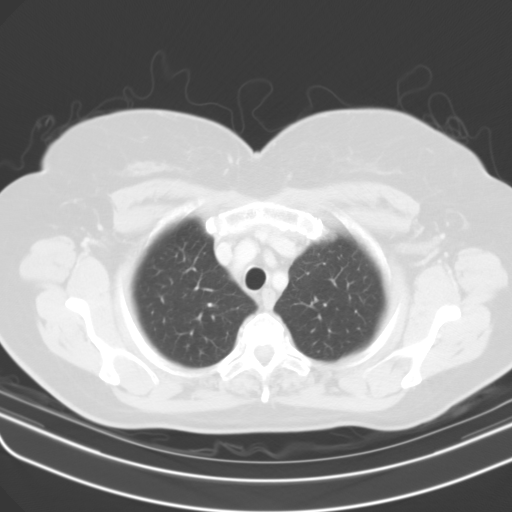
[im 58/63  lung]
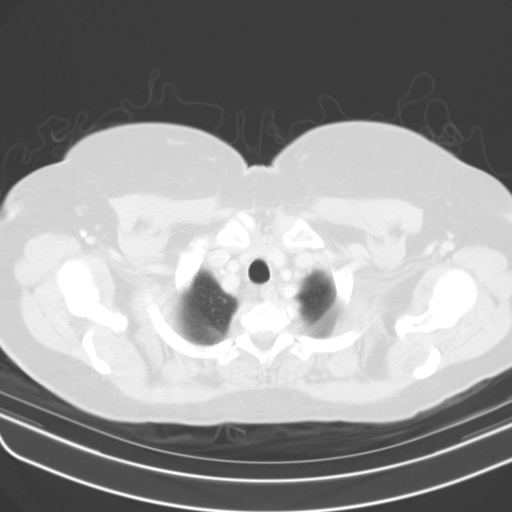

[Series 3: cor · coronal · 0.61mm/px · 3 of 96 slices shown]
[im 20/96  lung]
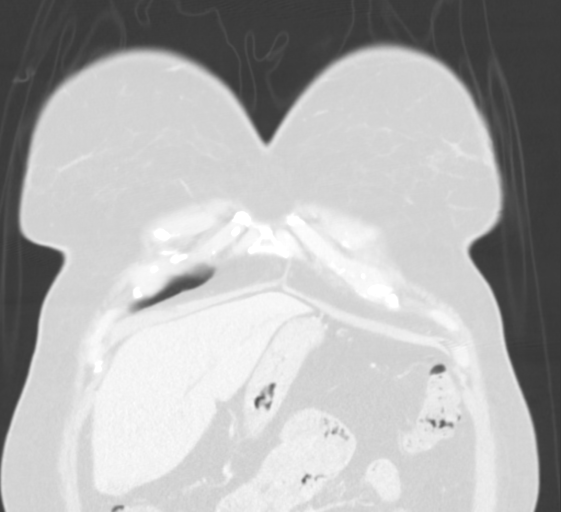
[im 39/96  lung]
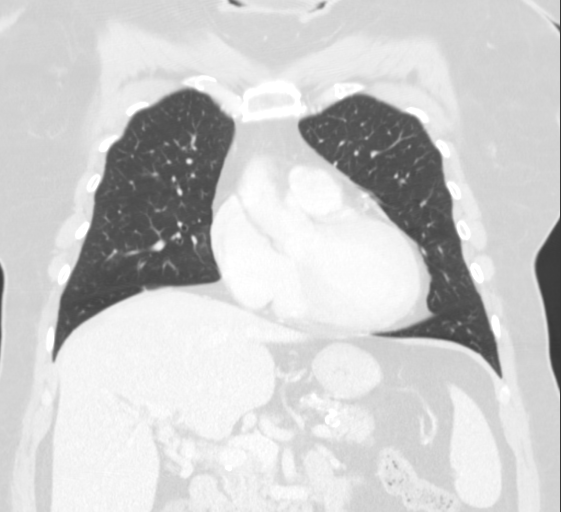
[im 58/96  lung]
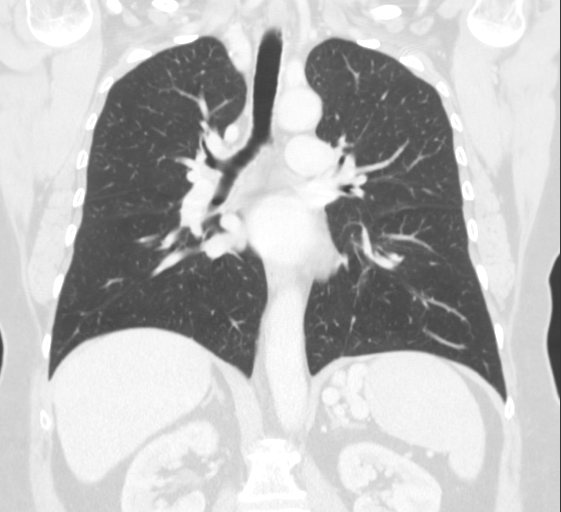

[15 of 36 positions shown; findings below may reference images not displayed]

FINDINGS: Creatinine was obtained on site at [HOSPITAL] at [HOSPITAL].

Results: Creatinine 0.7 mg/dL.

Mediastinum / Lymph Nodes: There is no axillary lymphadenopathy. No
mediastinal lymphadenopathy. There is no hilar lymphadenopathy. The
heart size is normal. No pericardial effusion. Coronary artery
calcification is noted.

Lungs / Pleura: Small area of tree in bud opacities identified in
the right middle lobe. No suspicious pulmonary nodule or mass. No
focal airspace consolidation. No pulmonary edema or pleural
effusion. Small posterior left diaphragmatic hernia contains only
fat.

Upper Abdomen: Mild intrahepatic biliary duct dilatation is evident
there appears to be cavernous transformation in the porta hepatis.
Superior mesenteric vein and splenic vein are patent. There is
prominence venous collateralization in the left upper quadrant with
features consistent with splenorenal shunt.

[HOSPITAL] / Soft Tissues: Bone windows reveal no worrisome lytic or
sclerotic osseous lesions.
IMPRESSION: 1. Small area tree-in-bud opacity in the right middle lobe. Imaging
features suggest atypical infection.
2. Cavernous transformation in the porta hepatis with left abdominal
venous collateralization. Portal venous hypertension is suspected.

## 2018-04-09 ENCOUNTER — Encounter: Payer: Self-pay | Admitting: Adult Health

## 2018-04-09 ENCOUNTER — Ambulatory Visit: Payer: Medicare Other | Admitting: Adult Health

## 2018-04-09 DIAGNOSIS — R05 Cough: Secondary | ICD-10-CM

## 2018-04-09 DIAGNOSIS — R058 Other specified cough: Secondary | ICD-10-CM

## 2018-04-09 DIAGNOSIS — J3081 Allergic rhinitis due to animal (cat) (dog) hair and dander: Secondary | ICD-10-CM | POA: Diagnosis not present

## 2018-04-09 DIAGNOSIS — J4530 Mild persistent asthma, uncomplicated: Secondary | ICD-10-CM

## 2018-04-09 DIAGNOSIS — J45909 Unspecified asthma, uncomplicated: Secondary | ICD-10-CM | POA: Insufficient documentation

## 2018-04-09 DIAGNOSIS — J309 Allergic rhinitis, unspecified: Secondary | ICD-10-CM | POA: Insufficient documentation

## 2018-04-09 MED ORDER — BUDESONIDE-FORMOTEROL FUMARATE 80-4.5 MCG/ACT IN AERO: INHALATION_SPRAY | RESPIRATORY_TRACT | 5 refills | Status: DC

## 2018-04-09 MED ORDER — BUDESONIDE-FORMOTEROL FUMARATE 80-4.5 MCG/ACT IN AERO: 2.0000 | INHALATION_SPRAY | Freq: Two times a day (BID) | RESPIRATORY_TRACT | 0 refills | Status: DC

## 2018-04-09 NOTE — Assessment & Plan Note (Signed)
Add claritin and saline nasal rinses  Trigger prevention

## 2018-04-09 NOTE — Assessment & Plan Note (Addendum)
Appears controlled on regimen  Cont to control for triggers   Plan  Patient Instructions  Follow med calendar closely and bring to each visit.  Saline nasal rinses as needed.  Claritin 10mg  daily As needed  drainge  Continue on Symbicort 2 puffs Twice daily  , rinse after use.  Follow up with Dr. Melvyn Novas  In 4-6  months and As needed

## 2018-04-09 NOTE — Patient Instructions (Addendum)
Follow med calendar closely and bring to each visit.  Saline nasal rinses as needed.  Claritin 10mg  daily As needed  drainge  Continue on Symbicort 2 puffs Twice daily  , rinse after use.  Follow up with Dr. Melvyn Novas  In 4-6  months and As needed

## 2018-04-09 NOTE — Addendum Note (Signed)
Addended by: Parke Poisson E on: 04/09/2018 04:59 PM   Modules accepted: Orders

## 2018-04-09 NOTE — Assessment & Plan Note (Addendum)
Much improved with trigger prevention   Plan  Patient Instructions  Follow med calendar closely and bring to each visit.  Saline nasal rinses as needed.  Claritin 10mg  daily As needed  drainge  Continue on Symbicort 2 puffs Twice daily  , rinse after use.  Follow up with Dr. Melvyn Novas  In 4-6  months and As needed     '

## 2018-04-09 NOTE — Progress Notes (Signed)
@Patient  ID: Shelley Perez, female    DOB: 1950/10/11, 67 y.o.   MRN: [id number]  Chief Complaint  Patient presents with  . Follow-up    Cough     Referring provider: Janith Lima, MD  HPI: 67 year old female former smoker, 2015, seen for pulmonary consult November 22, 2015 for cough found to have COPD GOLD 0/AB /UACS   TEST  ENT Eval 03/19/16 shoemaker: GERD only   PFT's  12/28/2015  FEV1 1.95 (91 % ) ratio 85  p 6 % improvement from saba p no rx prior to study with DLCO  85 % corrects to 106 % for alv volume   CT chest 10/2015 >Small area tree-in-bud opacity in the right middle lobe. Imaging features suggest atypical infection   04/09/2018 Follow up ; COPD , Med Review  Patient presents for a 98-month follow-up.  Patient is followed for asthmatic bronchitis/chronic cough.  She remains on Symbicort Twice daily  .  Says overall that breathing is doing well.  She denies any flare of her cough.  Says she is very active does her own yard work in fact push mows her yard.  And does light landscaping.  She does have some nasal congestion and drainage.  Was given Dymista last visit but says she could not tolerate.  We discussed trial of saline nasal rinses and Claritin as needed  We reviewed all her medications organize them into a medication count with patient education.  She appears to be taking her medications correctly   Allergies  Allergen Reactions  . Rosuvastatin Calcium     Other reaction(s): Other Muscle and joint pain  . Atorvastatin     Muscle and joint pain  . Codeine Nausea Only and Other (See Comments)    Stomach pains  . Crestor [Rosuvastatin]     Muscle and joint pain  . Prozac [Fluoxetine Hcl] Other (See Comments)    Changes her personality  . Latex Rash  . Sulfa Antibiotics Rash    Immunization History  Administered Date(s) Administered  . Influenza, High Dose Seasonal PF 06/05/2016, 06/09/2017  . Influenza, Seasonal, Injecte, Preservative Fre 05/14/2015  .  Pneumococcal Conjugate-13 12/19/2016  . Pneumococcal Polysaccharide-23 05/14/2015  . Tdap 06/05/2016    Past Medical History:  Diagnosis Date  . Anemia   . Anxiety   . Arthritis   . Asthma   . Complication of anesthesia    "they gave me too much and I had to stay 3 days " 2005 after choley   . COPD (chronic obstructive pulmonary disease) ([place])   . Depression   . Fibromyalgia   . History of kidney stones   . Hx of pancreatitis    with gallstones  . Hyperlipidemia   . Insomnia   . Osteoporosis     Tobacco History: Social History   Tobacco Use  Smoking Status Former Smoker  . Packs/day: 1.00  . Years: 40.00  . Pack years: 40.00  . Types: Cigarettes  . Last attempt to quit: 06/28/2013  . Years since quitting: 4.7  Smokeless Tobacco Never Used   Counseling given: Not Answered   Outpatient Medications Prior to Visit  Medication Sig Dispense Refill  . albuterol (PROVENTIL HFA) 108 (90 Base) MCG/ACT inhaler Inhale into the lungs.    Marland Kitchen aspirin 81 MG tablet Take 81 mg by mouth daily.    . budesonide-formoterol (SYMBICORT) 80-4.5 MCG/ACT inhaler Take 2 puffs first thing in am and then another 2 puffs about 12 hours later.  1 Inhaler 3  . celecoxib (CELEBREX) 100 MG capsule Take 1 capsule (100 mg total) by mouth 2 (two) times daily. 120 capsule 1  . Cholecalciferol 2000 units TABS Take 1 tablet (2,000 Units total) by mouth daily. 90 tablet 1  . cyanocobalamin 2000 MCG tablet Take 1 tablet (2,000 mcg total) by mouth daily. 90 tablet 1  . diclofenac sodium (VOLTAREN) 1 % GEL Apply 1 application topically 4 (four) times daily as needed.    . famotidine (PEPCID) 20 MG tablet Take 20 mg by mouth 2 (two) times daily.    . furosemide (LASIX) 20 MG tablet TAKE 1 TABLET(20 MG) BY MOUTH TWICE DAILY 180 tablet 1  . ipratropium-albuterol (DUONEB) 0.5-2.5 (3) MG/3ML SOLN Up to one every 4 hours as needed    . linaclotide (LINZESS) 145 MCG CAPS capsule Take 1 capsule (145 mcg total) by mouth  daily as needed. 90 capsule 1  . Misc. Devices (ACAPELLA) MISC Use as needed.    . Pitavastatin Calcium (LIVALO) 2 MG TABS Take 1 tablet (2 mg total) by mouth daily. 90 tablet 1  . potassium chloride (K-DUR) 10 MEQ tablet TAKE 1 TABLET BY MOUTH DAILY 90 tablet 1  . sertraline (ZOLOFT) 100 MG tablet Take 1 tablet (100 mg total) by mouth daily. 90 tablet 1  . valACYclovir (VALTREX) 1000 MG tablet Take 1 tablet (1,000 mg total) by mouth 3 (three) times daily. 21 tablet 0   No facility-administered medications prior to visit.      Review of Systems  Constitutional:   No  weight loss, night sweats,  Fevers, chills, fatigue, or  lassitude.  HEENT:   No headaches,  Difficulty swallowing,  Tooth/dental problems, or  Sore throat,                No sneezing, itching, ear ache,  +nasal congestion, post nasal drip,   CV:  No chest pain,  Orthopnea, PND, swelling in lower extremities, anasarca, dizziness, palpitations, syncope.   GI  No heartburn, indigestion, abdominal pain, nausea, vomiting, diarrhea, change in bowel habits, loss of appetite, bloody stools.   Resp: No shortness of breath with exertion or at rest.  No excess mucus, no productive cough,  No non-productive cough,  No coughing up of blood.  No change in color of mucus.  No wheezing.  No chest wall deformity  Skin: no rash or lesions.  GU: no dysuria, change in color of urine, no urgency or frequency.  No flank pain, no hematuria   MS:  No joint pain or swelling.  No decreased range of motion.  No back pain.    Physical Exam  BP 128/74 (BP Location: Left Arm, Cuff Size: Normal)   Pulse 76   Ht 5' 0.75" (1.543 m)   Wt 186 lb (84.4 kg)   SpO2 97%   BMI 35.43 kg/m   GEN: A/Ox3; pleasant , NAD, well nourished    HEENT:  [place]/AT,  EACs-clear, TMs-wnl, NOSE-clear drainage , THROAT-clear, no lesions, no postnasal drip or exudate noted.   NECK:  Supple w/ fair ROM; no JVD; normal carotid impulses w/o bruits; no thyromegaly or  nodules palpated; no lymphadenopathy.    RESP  Clear  P & A; w/o, wheezes/ rales/ or rhonchi. no accessory muscle use, no dullness to percussion  CARD:  RRR, no m/r/g, no peripheral edema, pulses intact, no cyanosis or clubbing.  GI:   Soft & nt; nml bowel sounds; no organomegaly or masses detected.   Musco:  Warm bil, no deformities or joint swelling noted.   Neuro: alert, no focal deficits noted.    Skin: Warm, no lesions or rashes    Lab Results:  CBC  BMET   BNP No results found for: BNP  ProBNP No results found for: PROBNP  Imaging: No results found.   Assessment & Plan:   Asthma Appears controlled on regimen  Cont to control for triggers   Plan  Patient Instructions  Follow med calendar closely and bring to each visit.  Saline nasal rinses as needed.  Claritin 10mg  daily As needed  drainge  Continue on Symbicort 2 puffs Twice daily  , rinse after use.  Follow up with Dr. Melvyn Novas  In 4-6  months and As needed        Upper airway cough syndrome Much improved with trigger prevention   Plan  Patient Instructions  Follow med calendar closely and bring to each visit.  Saline nasal rinses as needed.  Claritin 10mg  daily As needed  drainge  Continue on Symbicort 2 puffs Twice daily  , rinse after use.  Follow up with Dr. Melvyn Novas  In 4-6  months and As needed     '  Allergic rhinitis Add claritin and saline nasal rinses  Trigger prevention      Rexene Edison, NP 04/09/2018

## 2018-04-12 NOTE — Progress Notes (Signed)
Chart and office note reviewed in detail  > agree with a/p as outlined    

## 2018-04-13 NOTE — Addendum Note (Signed)
Addended by: Parke Poisson E on: 04/13/2018 12:24 PM   Modules accepted: Orders

## 2018-04-28 ENCOUNTER — Encounter: Payer: Self-pay | Admitting: Internal Medicine

## 2018-04-28 ENCOUNTER — Ambulatory Visit (INDEPENDENT_AMBULATORY_CARE_PROVIDER_SITE_OTHER): Payer: Medicare Other | Admitting: Internal Medicine

## 2018-04-28 VITALS — BP 120/60 | HR 76 | Temp 97.6°F | Resp 16 | Ht 60.75 in | Wt 183.0 lb

## 2018-04-28 DIAGNOSIS — Z23 Encounter for immunization: Secondary | ICD-10-CM | POA: Diagnosis not present

## 2018-04-28 DIAGNOSIS — N644 Mastodynia: Secondary | ICD-10-CM | POA: Diagnosis not present

## 2018-04-28 MED ORDER — ZOSTER VAC RECOMB ADJUVANTED 50 MCG/0.5ML IM SUSR: 0.5000 mL | Freq: Once | INTRAMUSCULAR | 1 refills | Status: AC

## 2018-04-28 NOTE — Patient Instructions (Signed)
Breast Self-Awareness Breast self-awareness means:  Knowing how your breasts look.  Knowing how your breasts feel.  Checking your breasts every month for changes.  Telling your doctor if you notice a change in your breasts.  Breast self-awareness allows you to notice a breast problem early while it is still small. How to do a breast self-exam One way to learn what is normal for your breasts and to check for changes is to do a breast self-exam. To do a breast self-exam: Look for Changes  1. Take off all the clothes above your waist. 2. Stand in front of a mirror in a room with good lighting. 3. Put your hands on your hips. 4. Push your hands down. 5. Look at your breasts and nipples in the mirror to see if one breast or nipple looks different than the other. Check to see if: ? The shape of one breast is different. ? The size of one breast is different. ? There are wrinkles, dips, and bumps in one breast and not the other. 6. Look at each breast for changes in your skin, such as: ? Redness. ? Scaly areas. 7. Look for changes in your nipples, such as: ? Liquid around the nipples. ? Bleeding. ? Dimpling. ? Redness. ? A change in where the nipples are. Feel for Changes 1. Lie on your back on the floor. 2. Feel each breast. To do this, follow these steps: ? Pick a breast to feel. ? Put the arm closest to that breast above your head. ? Use your other arm to feel the nipple area of your breast. Feel the area with the pads of your three middle fingers by making small circles with your fingers. For the first circle, press lightly. For the second circle, press harder. For the third circle, press even harder. ? Keep making circles with your fingers at the light, harder, and even harder pressures as you move down your breast. Stop when you feel your ribs. ? Move your fingers a little toward the center of your body. ? Start making circles with your fingers again, this time going up until  you reach your collarbone. ? Keep making up and down circles until you reach your armpit. Remember to keep using the three pressures. ? Feel the other breast in the same way. 3. Sit or stand in the shower or tub. 4. With soapy water on your skin, feel each breast the same way you did in step 2, when you were lying on the floor. Write Down What You Find  After doing the self-exam, write down:  What is normal for each breast.  Any changes you find in each breast.  When you last had your period.  How often should I check my breasts? Check your breasts every month. If you are breastfeeding, the best time to check them is after you feed your baby or after you use a breast pump. If you get periods, the best time to check your breasts is 5-7 days after your period is over. When should I see my doctor? See your doctor if you notice:  A change in shape or size of your breasts or nipples.  A change in the skin of your breast or nipples, such as red or scaly skin.  Unusual fluid coming from your nipples.  A lump or thick area that was not there before.  Pain in your breasts.  Anything that concerns you.  This information is not intended to replace advice given to   you by your health care provider. Make sure you discuss any questions you have with your health care provider. Document Released: 01/21/2008 Document Revised: 01/10/2016 Document Reviewed: 06/24/2015 Elsevier Interactive Patient Education  2018 [employer] Inc[employer]  

## 2018-04-28 NOTE — Progress Notes (Signed)
Subjective:  Patient ID: Shelley Perez, female    DOB: 04-Jul-1951  Age: 67 y.o. MRN: [id number]  CC: Breast Pain   HPI Shelley Perez presents for concerns about her right breast.  She felt like there was a lump in the right upper part of her breast about a month ago.  At the same time she had started taking phentermine so she thought the 2 things were connected.  She has since quit taking phentermine and tells me the lump has resolved but she feels a sensation of discomfort throughout her right upper breast.  Outpatient Medications Prior to Visit  Medication Sig Dispense Refill  . albuterol (PROVENTIL HFA) 108 (90 Base) MCG/ACT inhaler Inhale into the lungs.    Marland Kitchen aspirin 81 MG tablet Take 81 mg by mouth daily.    . budesonide-formoterol (SYMBICORT) 80-4.5 MCG/ACT inhaler Inhale 2 puffs into the lungs 2 (two) times daily. 1 Inhaler 0  . celecoxib (CELEBREX) 100 MG capsule Take 1 capsule (100 mg total) by mouth 2 (two) times daily. 120 capsule 1  . Cholecalciferol 2000 units TABS Take 1 tablet (2,000 Units total) by mouth daily. 90 tablet 1  . Cyanocobalamin (B-12 COMPLIANCE INJECTION IJ) Inject as directed. Bi-monthly    . dextromethorphan-guaiFENesin (MUCINEX DM) 30-600 MG 12hr tablet Take 1 tablet by mouth 2 (two) times daily as needed for cough.    . famotidine (PEPCID) 20 MG tablet Take 20 mg by mouth 2 (two) times daily.    . furosemide (LASIX) 20 MG tablet TAKE 1 TABLET(20 MG) BY MOUTH TWICE DAILY 180 tablet 1  . ipratropium-albuterol (DUONEB) 0.5-2.5 (3) MG/3ML SOLN Up to one every 4 hours as needed    . linaclotide (LINZESS) 145 MCG CAPS capsule Take 1 capsule (145 mcg total) by mouth daily as needed. 90 capsule 1  . loratadine (CLARITIN) 10 MG tablet Take 10 mg by mouth daily as needed for allergies.    . Misc. Devices (ACAPELLA) MISC Use as needed.    . Omega-3 1000 MG CAPS Take 1 capsule by mouth daily.    . phentermine 37.5 MG capsule Take 37.5 mg by mouth every morning.      . Pitavastatin Calcium (LIVALO) 2 MG TABS Take 1 tablet (2 mg total) by mouth daily. 90 tablet 1  . potassium chloride (K-DUR) 10 MEQ tablet TAKE 1 TABLET BY MOUTH DAILY 90 tablet 1  . sertraline (ZOLOFT) 100 MG tablet Take 1 tablet (100 mg total) by mouth daily. 90 tablet 1  . co-enzyme Q-10 30 MG capsule Take 30 mg by mouth daily.    . SODIUM CHLORIDE, EXTERNAL, 0.9 % SOLN Nasal rinses as needed    . budesonide-formoterol (SYMBICORT) 80-4.5 MCG/ACT inhaler Take 2 puffs first thing in am and then another 2 puffs about 12 hours later. 1 Inhaler 5   No facility-administered medications prior to visit.     ROS Review of Systems  Constitutional: Negative for chills and fever.  HENT: Negative.   Eyes: Negative.   Respiratory: Negative for cough, chest tightness and shortness of breath.   Cardiovascular: Negative for chest pain, palpitations and leg swelling.  Gastrointestinal: Negative for abdominal pain, diarrhea and nausea.  Endocrine: Negative.   Genitourinary: Negative for difficulty urinating.  Musculoskeletal: Negative.   Skin: Negative.  Negative for color change and rash.  Neurological: Negative.   Hematological: Negative for adenopathy. Does not bruise/bleed easily.  Psychiatric/Behavioral: Negative.   All other systems reviewed and are negative.   Objective:  BP 120/60 (BP Location: Left Arm, Patient Position: Sitting, Cuff Size: Normal)   Pulse 76   Temp 97.6 F (36.4 C) (Oral)   Resp 16   Ht 5' 0.75" (1.543 m)   Wt 183 lb (83 kg)   SpO2 97%   BMI 34.86 kg/m   BP Readings from Last 3 Encounters:  04/28/18 120/60  04/09/18 128/74  02/25/18 (!) 157/58    Wt Readings from Last 3 Encounters:  04/28/18 183 lb (83 kg)  04/09/18 186 lb (84.4 kg)  02/25/18 192 lb (87.1 kg)    Physical Exam  Constitutional: She is oriented to person, place, and time. No distress.  HENT:  Mouth/Throat: Oropharynx is clear and moist. No oropharyngeal exudate.  Eyes: Conjunctivae  are normal. No scleral icterus.  Neck: Normal range of motion. Neck supple. No JVD present. No thyromegaly present.  Cardiovascular: Normal rate, regular rhythm and normal heart sounds. Exam reveals no friction rub.  No murmur heard. Pulmonary/Chest: Effort normal and breath sounds normal. No respiratory distress. She has no wheezes. She has no rhonchi. She has no rales. She exhibits no mass, no tenderness, no crepitus, no edema, no deformity, no swelling and no retraction. Right breast exhibits no inverted nipple, no mass, no nipple discharge, no skin change and no tenderness. Left breast exhibits no inverted nipple, no mass, no nipple discharge, no skin change and no tenderness. No breast swelling.  Abdominal: Soft. Bowel sounds are normal. She exhibits no mass. There is no tenderness.  Musculoskeletal: Normal range of motion. She exhibits no edema, tenderness or deformity.  Lymphadenopathy:    She has no cervical adenopathy.    She has no axillary adenopathy.       Right axillary: No pectoral and no lateral adenopathy present.       Left axillary: No pectoral and no lateral adenopathy present.      Right: No supraclavicular adenopathy present.       Left: No supraclavicular adenopathy present.  Neurological: She is alert and oriented to person, place, and time.  Skin: Skin is warm and dry. No rash noted. She is not diaphoretic. No pallor.  Psychiatric: She has a normal mood and affect.  Vitals reviewed.   Lab Results  Component Value Date   WBC 8.7 02/16/2018   HGB 12.1 02/16/2018   HCT 35.8 (L) 02/16/2018   PLT 445.0 (H) 02/16/2018   GLUCOSE 102 (H) 02/16/2018   CHOL 224 (H) 02/16/2018   TRIG 381.0 (H) 02/16/2018   HDL 52.30 02/16/2018   LDLDIRECT 120.0 02/16/2018   LDLCALC 122 (H) 01/21/2017   ALT 21 02/16/2018   AST 19 02/16/2018   NA 140 02/16/2018   K 3.9 02/16/2018   CL 105 02/16/2018   CREATININE 0.67 02/16/2018   BUN 16 02/16/2018   CO2 29 02/16/2018   TSH 2.74  02/16/2018   INR 1.06 06/29/2015   HGBA1C 5.6 01/21/2017    Mr Abdomen W Wo Contrast  Result Date: 01/18/2018 CLINICAL DATA:  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 Abdominal bloating. Weight gain. Prior cholecystectomy. EXAM: MRI ABDOMEN WITHOUT AND WITH CONTRAST TECHNIQUE: Multiplanar multisequence MR imaging of the abdomen was performed both before and after the administration of intravenous contrast. Creatinine was obtained on site at [facility] at [address] Results: Creatinine 0.7 mg/dL. CONTRAST:  5mL MULTIHANCE GADOBENATE DIMEGLUMINE 529 MG/ML IV SOLN COMPARISON:  12/31/2017 lumbar spine radiographs. FINDINGS: Lower chest: Small fat containing left-sided Bochdalek's hernia. No acute  abnormality at the lung bases. Hepatobiliary: Normal liver size and configuration. No significant hepatic steatosis. No liver mass. Cholecystectomy. Bile ducts are within normal post cholecystectomy limits, with common bile duct diameter 6 mm. No choledocholithiasis. Pancreas: No pancreatic mass or duct dilation. No pancreas divisum. Pancreatic parenchymal signal intensity is within normal limits. No peripancreatic fluid collections. Spleen: Normal size. No mass. Adrenals/Urinary Tract: Normal adrenals. No hydronephrosis. Subcentimeter simple renal cyst in the medial lower right kidney. Otherwise normal kidneys, with no suspicious renal masses. Stomach/Bowel: Small hiatal hernia. Otherwise normal nondistended stomach. Visualized small and large bowel is normal caliber, with no bowel wall thickening. Vascular/Lymphatic: Atherosclerotic nonaneurysmal abdominal aorta. Patent portal, splenic, hepatic and renal veins. No pathologically enlarged lymph nodes in the abdomen. Other: No abdominal ascites or focal fluid collection. Musculoskeletal: No aggressive appearing focal osseous lesions. IMPRESSION: 1. No acute abnormality. Normal pancreas, with no evidence of acute  pancreatitis. Bile ducts are within normal post cholecystectomy limits. 2. Small hiatal hernia. 3.  Aortic Atherosclerosis (ICD10-I70.0). Electronically Signed   By: Ilona Sorrel M.D.   On: 01/18/2018 08:22    Assessment & Plan:   Shelley Perez was seen today for breast pain.  Diagnoses and all orders for this visit:  Need for influenza vaccination -     Flu vaccine HIGH DOSE PF (Fluzone High dose)  Mastodynia of right breast- She has discomfort but the examination is normal.  I have asked her to undergo a diagnostic mammogram to see if there is a suspicious lesion. -     MM DIAG BREAST TOMO BILATERAL; Future  Other orders -     Zoster Vaccine Adjuvanted [facility]) injection; Inject 0.5 mLs into the muscle once for 1 dose.   I have discontinued Shelley Perez's co-enzyme Q-10 and SODIUM CHLORIDE (EXTERNAL). I am also having her start on Zoster Vaccine Adjuvanted. Additionally, I am having her maintain her aspirin, ipratropium-albuterol, famotidine, ACAPELLA, Cholecalciferol, celecoxib, albuterol, linaclotide, Pitavastatin Calcium, sertraline, furosemide, potassium chloride, budesonide-formoterol, phentermine, Omega-3, dextromethorphan-guaiFENesin, loratadine, and Cyanocobalamin (B-12 COMPLIANCE INJECTION IJ).  Meds ordered this encounter  Medications  . Zoster Vaccine Adjuvanted [facility]) injection    Sig: Inject 0.5 mLs into the muscle once for 1 dose.    Dispense:  0.5 mL    Refill:  1     Follow-up: Return if symptoms worsen or fail to improve.  Shelley Calico, MD

## 2018-05-03 ENCOUNTER — Other Ambulatory Visit: Payer: Self-pay | Admitting: Internal Medicine

## 2018-05-03 DIAGNOSIS — N644 Mastodynia: Secondary | ICD-10-CM

## 2018-05-07 ENCOUNTER — Other Ambulatory Visit: Payer: Medicare Other

## 2018-05-13 ENCOUNTER — Ambulatory Visit
Admission: RE | Admit: 2018-05-13 | Discharge: 2018-05-13 | Disposition: A | Discharge status: Home / Self Care | Payer: Medicare Other | Source: Ambulatory Visit | Attending: Internal Medicine | Admitting: Internal Medicine

## 2018-05-13 ENCOUNTER — Other Ambulatory Visit: Payer: Self-pay | Admitting: Internal Medicine

## 2018-05-13 DIAGNOSIS — N644 Mastodynia: Secondary | ICD-10-CM

## 2018-05-13 DIAGNOSIS — R928 Other abnormal and inconclusive findings on diagnostic imaging of breast: Secondary | ICD-10-CM | POA: Diagnosis not present

## 2018-05-13 DIAGNOSIS — N6489 Other specified disorders of breast: Secondary | ICD-10-CM | POA: Diagnosis not present

## 2018-06-12 ENCOUNTER — Encounter: Payer: Self-pay | Admitting: Family Medicine

## 2018-06-12 ENCOUNTER — Ambulatory Visit: Payer: Medicare Other | Admitting: Family Medicine

## 2018-06-12 VITALS — BP 138/80 | HR 60 | Temp 99.0°F | Wt 185.0 lb

## 2018-06-12 DIAGNOSIS — J441 Chronic obstructive pulmonary disease with (acute) exacerbation: Secondary | ICD-10-CM

## 2018-06-12 MED ORDER — ALBUTEROL SULFATE (2.5 MG/3ML) 0.083% IN NEBU: 2.5000 mg | INHALATION_SOLUTION | Freq: Four times a day (QID) | RESPIRATORY_TRACT | 1 refills | Status: DC | PRN

## 2018-06-12 MED ORDER — AZITHROMYCIN 250 MG PO TABS: ORAL_TABLET | ORAL | 0 refills | Status: DC

## 2018-06-12 MED ORDER — PREDNISONE 10 MG PO TABS: ORAL_TABLET | ORAL | 0 refills | Status: DC

## 2018-06-12 NOTE — Progress Notes (Signed)
   Subjective:    Patient ID: Shelley Perez, female    DOB: 1950-10-17, 67 y.o.   MRN: [id number]  HPI URI- sxs started as a sore throat, hoarseness of voice, nasal congestion.  Pt reports airway is 'raw' and 'it hurts to breathe'.  + cough- nonproductive.  No fevers.  + HA.  No facial pain/presure.  No ear pain.  No known sick contacts.  No N/V.  Taking Mucinex, Claritin, Symbicort.  Not using albuterol.  + COPD hx w/ acute exacerbations typically requiring steroids and abx to improve.   Review of Systems For ROS see HPI     Objective:   Physical Exam  Constitutional: She is oriented to person, place, and time. She appears well-developed and well-nourished. No distress.  HENT:  Head: Normocephalic and atraumatic.  TMs normal bilaterally Mild nasal congestion Throat w/out erythema, edema, or exudate No TTP over sinuses  Eyes: Pupils are equal, round, and reactive to light. Conjunctivae and EOM are normal.  Neck: Normal range of motion. Neck supple.  Cardiovascular: Normal rate, regular rhythm, normal heart sounds and intact distal pulses.  No murmur heard. Pulmonary/Chest: Effort normal. No respiratory distress. She has wheezes (scattered expiratory wheezes- improved s/p neb tx).  + hacking cough  Lymphadenopathy:    She has no cervical adenopathy.  Neurological: She is alert and oriented to person, place, and time.  Skin: Skin is warm and dry.  Vitals reviewed.         Assessment & Plan:

## 2018-06-12 NOTE — Patient Instructions (Signed)
Follow up as needed or as scheduled START the Azithromycin (Zpack) as directed TAKE the Prednisone as directed- 3 pills at the same time x3 days, then 2 pills x3 days, then 1 pill.  Take w/ food Continue your Symbicort and Albuterol inhalers Call with any questions or concerns Hang in there!

## 2018-06-12 NOTE — Assessment & Plan Note (Signed)
New to provider, recurrent issue for pt.  Wheezing and cough improved s/p neb tx in office.  Pt reports Zpacks and Prednisone have been required in the past to improve sxs.  Needs refill on Albuterol nebs- prescription sent.  Reviewed supportive care and red flags that should prompt return.  Pt expressed understanding and is in agreement w/ plan.

## 2018-09-13 ENCOUNTER — Encounter: Payer: Self-pay | Admitting: Internal Medicine

## 2018-09-13 ENCOUNTER — Ambulatory Visit (INDEPENDENT_AMBULATORY_CARE_PROVIDER_SITE_OTHER): Payer: Medicare Other | Admitting: Internal Medicine

## 2018-09-13 VITALS — BP 130/80 | HR 70 | Ht 60.75 in | Wt 193.0 lb

## 2018-09-13 DIAGNOSIS — J449 Chronic obstructive pulmonary disease, unspecified: Secondary | ICD-10-CM | POA: Diagnosis not present

## 2018-09-13 MED ORDER — BUDESONIDE-FORMOTEROL FUMARATE 80-4.5 MCG/ACT IN AERO: 2.0000 | INHALATION_SPRAY | Freq: Two times a day (BID) | RESPIRATORY_TRACT | 0 refills | Status: DC

## 2018-09-13 NOTE — Patient Instructions (Addendum)
Change symbicort to 80 2 pffs each am and the other 2 puffs are optional   Work on inhaler technique:  relax and gently blow all the way out then take a nice smooth deep breath back in, triggering the inhaler at same time you start breathing in.  Hold for up to 5 seconds if you can. Blow out thru nose. Rinse and gargle with water when done    Please schedule a follow up office visit in 9 months , call sooner if needed for more than your common cold

## 2018-09-13 NOTE — Progress Notes (Signed)
Subjective:    Patient ID: Shelley Perez, female    DOB: 05-Dec-1950,   MRN: [id number]    Brief patient profile:  65 yowf quit smoking 2015 with some sensation of midline chest discomfort brought on by smoking that resolved @ 172lb p quit smoking with no breathing problems though tendency for colds going to chest and persistent sore throat since ET 11/22/116 for R knee then  worst episode middle of Feb 2017 >  sev ov's rx as flu with tamiflu / pred/ abx/ only some better so referred to pulmonary clinic 11/22/2015 by Dr  Shelley Perez at [facility]   History of Present Illness  11/22/2015 1st [facility] office visit/ Shelley Perez   Chief Complaint  Patient presents with  . Pulmonary Consult    Referred by Dr. Philis Perez. Pt c/o cough and SOB since she was dxed with Influenza in Feb 2017. Cough is prod with yellow sputum.    rx with doxy and prednisone x 4 days and qvar not better. Cough is worse at hs and in am and doe x more than slow walk on flat level = MMRC2 Having to use neb saba qid whereas prior to feb 2017 was not using it at all and prev on no maint rx  rec Plan A = Automatic =  symbicort 160 Take 2 puffs first thing in am and then another 2 puffs about 12 hours later.  Plan B = Backup  ok to use the nebulizer up to every 4 hours but if start needing it regularly call for immediate appointment GERD diet      12/28/2015  f/u ov/Shelley Perez re: GOLD 0 copd/ mild AB/uacs Chief Complaint  Patient presents with  . Follow-up    Breathing has improved. She has not had to use albuterol inhaler or neb.   can't take ppi regularly due to joints aching  Not taking pepcid at all  rec You do have significant copd and unlikely you ever will  Continue symbicort 80 Take 2 puffs first thing in am and then another 2 puffs about 12 hours later if you think you need it.  Try pepcid ac 20 mg after bfast and after supper  GERD diet     05/21/2017  f/u ov/Shelley Perez re:  GOLD 0 copd/ AB with prob UACS  Chief  Complaint  Patient presents with  . Hospitalization Follow-up    Pt in hospital 05/13/17-05/15/17 due to COPD. States that she is still coughing with yellow to white mucus and c/o SOB all the time that is worse early in the morning and at night. Pt states that she is sore in the chest due to being hospitalized.  after last visit did fine on symbicort 80 2pffs daily but stopped it after several months but still had trouble had trouble with mowing grass due to cough/ sob and did not call as better when avoided exposure  [place] since late summer  2018 with recurrent cough/sob  eval by Shelley Perez 04/28/17 and no better p omnicef and prednisone  admtted 9/26-28/ 2018  [place] with dx "aecopd" rx with steroids and duoneb and better but not back to baseline ex tol and "never want to get that bad again. / not on any gerd rx  Still with coughing fits/ quite harsh with obvious pseudowheeze  And can't sleep flat due to cough and chest gen sore ant with cough rec For cough > mucinex dm up to 1200 mg every 12 hours with flutter valve as much  as possible  And if still coughing then use your cough syrup as needed   Plan A = Automatic = symbicort  80 Take 2 puffs first thing in am and then another 2 puffs about 12 hours later.  Pecpid 20 mg after bfast and supper  Work on inhaler technique:   GERD   Plan B = Backup Only use your nebulizer as a rescue medication  Prednisone 10 mg take  4 each am x 2 days,   2 each am x 2 days,  1 each am x 2 days and stop      09/10/2017  f/u ov/Shelley Perez re:  Cough flare since summer of 2018  variant asthma vs uacs  Chief Complaint  Patient presents with  . Follow-up    Breathing is doing well and she has not needed her neb. No new co's.   main issue is nasal obst at hs, not using  Any not nasal sprays / not using bed blocks  Overt hb symptoms with mild orophanygeal sense of dysphagia  Not limited by breathing from desired activities   rec Continue Plan A = Automatic =  symbicort  80 Take 2 puffs first thing in am and then another 2 puffs about 12 hours later.  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 Follow-up    Cough is "pretty good" only has occ cough now- occ prod with clear sputum.  She rarely uses neb. She states she had a fall approx 2 months ago and fractured 2 ribs on the left.   Dyspnea:  Worse = MMRC1 = can walk nl pace, flat grade, can't hurry or go uphills or steps s sob   Cough: much better Sleep: about 10 degrees  SABA use:  Not using at all  Still hurting on L where fx 2 ribs  - pain down to a 2/10  s hematoma/bruising    09/13/2018  f/u ov/Shelley Perez re: copd GOLD 0  Chief Complaint  Patient presents with  . Follow-up    Breathing is doing well. She rarely uses her duoneb.   Dyspnea:  R [place]els stops her before beathing Cough: none Sleeping: about 10 degrees hob up  SABA use: rarely  02: none    No obvious day to day or daytime variability or assoc excess/ purulent sputum or mucus plugs or hemoptysis or cp or chest tightness, subjective wheeze or overt sinus or hb symptoms.   Sleeping  without nocturnal  or early am exacerbation  of respiratory  c/o's or need for noct saba. Also denies any obvious fluctuation of symptoms with weather or environmental changes or other aggravating or alleviating factors except as outlined above   No unusual exposure hx or h/o childhood pna/ asthma or knowledge of premature birth.  Current Allergies, Complete Past Medical History, Past Surgical History,  Family History, and Social History were reviewed in [employer] record.  ROS  The following are not active complaints unless bolded Hoarseness, sore throat, dysphagia, dental problems, itching, sneezing,  nasal congestion or discharge of excess mucus or purulent secretions, ear ache,   fever, chills, sweats, unintended wt loss or wt gain, classically pleuritic or exertional cp,  orthopnea pnd or arm/hand swelling  or leg swelling, presyncope, palpitations, abdominal pain, anorexia, nausea, vomiting, diarrhea  or change in bowel habits or change in bladder habits, change in stools or change in urine, dysuria, hematuria,  rash, arthralgias, visual complaints, headache, numbness, weakness or ataxia or problems with walking or coordination,  change in mood or  memory.        Current Meds  Medication Sig  . albuterol (PROVENTIL) (2.5 MG/3ML) 0.083% nebulizer solution Take 3 mLs (2.5 mg total) by nebulization every 6 (six) hours as needed for wheezing or shortness of breath.  Marland Kitchen aspirin 81 MG tablet Take 81 mg by mouth daily.  . budesonide-formoterol (SYMBICORT) 80-4.5 MCG/ACT inhaler Inhale 2 puffs into the lungs 2 (two) times daily.  . celecoxib (CELEBREX) 100 MG capsule Take 1 capsule (100 mg total) by mouth 2 (two) times daily.  . Cholecalciferol 2000 units TABS Take 1 tablet (2,000 Units total) by mouth daily.  . cyanocobalamin 1000 MCG tablet Take 1,000 mcg by mouth daily.  Marland Kitchen dextromethorphan-guaiFENesin (MUCINEX DM) 30-600 MG 12hr tablet Take 1 tablet by mouth 2 (two) times daily as needed for cough.  . famotidine (PEPCID) 20 MG tablet Take 20 mg by mouth 2 (two) times daily.  . furosemide (LASIX) 20 MG tablet TAKE 1 TABLET(20 MG) BY MOUTH TWICE DAILY  . ipratropium-albuterol (DUONEB) 0.5-2.5 (3) MG/3ML SOLN Up to one every 4 hours as needed  . linaclotide (LINZESS) 145 MCG CAPS capsule Take 1 capsule (145 mcg total) by mouth daily as needed.  . loratadine (CLARITIN) 10 MG  tablet Take 10 mg by mouth daily as needed for allergies.  . Misc. Devices (ACAPELLA) MISC Use as needed.  . Omega-3 1000 MG CAPS Take 1 capsule by mouth daily.  . Pitavastatin Calcium (LIVALO PO) Take 1 capsule by mouth daily.  . potassium chloride (K-DUR) 10 MEQ tablet TAKE 1 TABLET BY MOUTH DAILY  . sertraline (ZOLOFT) 100 MG tablet Take 1 tablet (100 mg total) by mouth daily.                   Objective:   Physical Exam  amb elderly mod obese wf  nad  09/13/2018       193  12/10/2017       191  09/10/2017       191  05/21/2017       185  12/28/2015       187  12/11/2015        187   11/22/15 191 lb 12.8 oz (87 kg)  11/11/15 184 lb 12.8 oz (83.825 kg)  11/10/15 187 lb (84.823 kg)       Vital signs reviewed - Note on arrival 02 sats  97% on RA       - full top denture/ moderate bilateral non-specific turbinate edema      HEENT: nl  ropharynx. Nl external ear canals without cough reflex -full top denture, mild bilateral non-specific turbinate edema     NECK :  without JVD/Nodes/TM/ nl carotid upstrokes bilaterally   LUNGS: no acc muscle use,  Nl contour chest which is clear to A and P bilaterally without cough on insp or exp maneuvers  CV:  RRR  no s3 or murmur or increase in P2, and no edema   ABD:  soft and nontender with nl inspiratory excursion in the supine position. No bruits or organomegaly appreciated, bowel sounds nl  MS:  Nl gait/ ext warm without deformities, calf tenderness, cyanosis or clubbing No obvious joint restrictions   SKIN: warm and dry without lesions    NEURO:  alert, approp, nl sensorium with  no motor or cerebellar deficits apparent.          Assessment & Plan:

## 2018-09-15 ENCOUNTER — Encounter: Payer: Self-pay | Admitting: Internal Medicine

## 2018-09-15 DIAGNOSIS — J449 Chronic obstructive pulmonary disease, unspecified: Secondary | ICD-10-CM | POA: Insufficient documentation

## 2018-09-15 NOTE — Assessment & Plan Note (Signed)
Quit smoking 2015 11/22/2015   try symbicort 160 2bid  - 12/11/2015   try reducing to symb 80 2bid  - PFT's  12/28/2015  FEV1 1.95 (91 % ) ratio 85  p 6 % improvement from saba p no rx prior to study with DLCO  85 % corrects to 106 % for alv volume     - 09/13/2018  After extensive coaching inhaler device,  effectiveness =    75%   Adequate control on present rx, reviewed in detail with pt > no change in rx needed    She has relatively mild asthmatic component.  She therefore should be able to reduce the Symbicort down to just 2 puffs in the morning and add the second 2 puffs if needed.  I had an extended discussion with the patient reviewing all relevant studies completed to date and  lasting 10 minutes of a 15 minute office visit    See device teaching which extended face to face time for this visit.  Each maintenance medication was reviewed in detail including emphasizing most importantly the difference between maintenance and prns and under what circumstances the prns are to be triggered using an action plan format that is not reflected in the computer generated alphabetically organized AVS which I have not found useful in most complex patients, especially with respiratory illnesses  Please see AVS for specific instructions unique to this visit that I personally wrote and verbalized to the the pt in detail and then reviewed with pt  by my nurse highlighting any  changes in therapy recommended at today's visit to their plan of care.     F/u in 6-9 months then yearly.

## 2018-11-22 ENCOUNTER — Other Ambulatory Visit: Payer: Self-pay | Admitting: Internal Medicine

## 2018-11-22 DIAGNOSIS — I10 Essential (primary) hypertension: Secondary | ICD-10-CM

## 2018-11-22 DIAGNOSIS — F418 Other specified anxiety disorders: Secondary | ICD-10-CM

## 2019-01-04 ENCOUNTER — Telehealth: Payer: Self-pay | Admitting: Internal Medicine

## 2019-01-04 ENCOUNTER — Other Ambulatory Visit: Payer: Self-pay | Admitting: Internal Medicine

## 2019-01-04 DIAGNOSIS — E559 Vitamin D deficiency, unspecified: Secondary | ICD-10-CM

## 2019-01-04 MED ORDER — CHOLECALCIFEROL 1.25 MG (50000 UT) PO CAPS: 50000.0000 [IU] | ORAL_CAPSULE | ORAL | 0 refills | Status: DC

## 2019-01-04 NOTE — Telephone Encounter (Signed)
Copied from [phone]. Topic: Quick Communication - See Telephone Encounter >> Jan 04, 2019  9:01 AM Blase Mess A wrote: CRM for notification. See Telephone encounter for: 01/04/19.  Patient is calling is request a script for Vitamin D 50,000 IU. Please advise. She went to the beach and her Vitamin D was low. Please advise. [phone]

## 2019-02-22 ENCOUNTER — Other Ambulatory Visit: Payer: Self-pay

## 2019-02-22 ENCOUNTER — Encounter: Payer: Self-pay | Admitting: Internal Medicine

## 2019-02-22 ENCOUNTER — Other Ambulatory Visit (INDEPENDENT_AMBULATORY_CARE_PROVIDER_SITE_OTHER): Payer: Medicare Other

## 2019-02-22 ENCOUNTER — Ambulatory Visit (INDEPENDENT_AMBULATORY_CARE_PROVIDER_SITE_OTHER): Payer: Medicare Other | Admitting: Internal Medicine

## 2019-02-22 VITALS — BP 130/70 | HR 77 | Temp 98.6°F | Ht 60.75 in | Wt 188.0 lb

## 2019-02-22 DIAGNOSIS — E559 Vitamin D deficiency, unspecified: Secondary | ICD-10-CM

## 2019-02-22 DIAGNOSIS — E785 Hyperlipidemia, unspecified: Secondary | ICD-10-CM

## 2019-02-22 DIAGNOSIS — R739 Hyperglycemia, unspecified: Secondary | ICD-10-CM | POA: Diagnosis not present

## 2019-02-22 DIAGNOSIS — D75839 Thrombocytosis, unspecified: Secondary | ICD-10-CM

## 2019-02-22 DIAGNOSIS — N3281 Overactive bladder: Secondary | ICD-10-CM

## 2019-02-22 DIAGNOSIS — D473 Essential (hemorrhagic) thrombocythemia: Secondary | ICD-10-CM

## 2019-02-22 DIAGNOSIS — Z0000 Encounter for general adult medical examination without abnormal findings: Secondary | ICD-10-CM

## 2019-02-22 DIAGNOSIS — D539 Nutritional anemia, unspecified: Secondary | ICD-10-CM

## 2019-02-22 LAB — CBC WITH DIFFERENTIAL/PLATELET
Basophils Absolute: 0.1 10*3/uL (ref 0.0–0.1)
Basophils Relative: 0.9 % (ref 0.0–3.0)
Eosinophils Absolute: 0.3 10*3/uL (ref 0.0–0.7)
Eosinophils Relative: 3.8 % (ref 0.0–5.0)
HCT: 37.9 % (ref 36.0–46.0)
Hemoglobin: 12.7 g/dL (ref 12.0–15.0)
Lymphocytes Relative: 35.7 % (ref 12.0–46.0)
Lymphs Abs: 2.4 10*3/uL (ref 0.7–4.0)
MCHC: 33.4 g/dL (ref 30.0–36.0)
MCV: 89.2 fl (ref 78.0–100.0)
Monocytes Absolute: 0.5 10*3/uL (ref 0.1–1.0)
Monocytes Relative: 7.8 % (ref 3.0–12.0)
Neutro Abs: 3.5 10*3/uL (ref 1.4–7.7)
Neutrophils Relative %: 51.8 % (ref 43.0–77.0)
Platelets: 446 10*3/uL — ABNORMAL HIGH (ref 150.0–400.0)
RBC: 4.25 Mil/uL (ref 3.87–5.11)
RDW: 13.6 % (ref 11.5–15.5)
WBC: 6.8 10*3/uL (ref 4.0–10.5)

## 2019-02-22 LAB — BASIC METABOLIC PANEL
BUN: 15 mg/dL (ref 6–23)
CO2: 29 mEq/L (ref 19–32)
Calcium: 9.2 mg/dL (ref 8.4–10.5)
Chloride: 104 mEq/L (ref 96–112)
Creatinine, Ser: 0.73 mg/dL (ref 0.40–1.20)
GFR: 79.2 mL/min (ref >60.00)
Glucose, Bld: 111 mg/dL — ABNORMAL HIGH (ref 70–99)
Potassium: 4.1 mEq/L (ref 3.5–5.1)
Sodium: 141 mEq/L (ref 135–145)

## 2019-02-22 LAB — IBC PANEL
Iron: 93 ug/dL (ref 42–145)
Saturation Ratios: 29.7 % (ref 20.0–50.0)
Transferrin: 224 mg/dL (ref 212.0–360.0)

## 2019-02-22 LAB — LIPID PANEL
Cholesterol: 211 mg/dL — ABNORMAL HIGH (ref 0–200)
HDL: 60.2 mg/dL (ref >39.00)
LDL Cholesterol: 128 mg/dL — ABNORMAL HIGH (ref 0–99)
NonHDL: 150.34
Total CHOL/HDL Ratio: 3
Triglycerides: 110 mg/dL (ref 0.0–149.0)
VLDL: 22 mg/dL (ref 0.0–40.0)

## 2019-02-22 LAB — URINALYSIS, ROUTINE W REFLEX MICROSCOPIC
Bilirubin Urine: NEGATIVE
Ketones, ur: NEGATIVE
Leukocytes,Ua: NEGATIVE
Nitrite: NEGATIVE
Specific Gravity, Urine: 1.01 (ref 1.000–1.030)
Total Protein, Urine: NEGATIVE
Urine Glucose: NEGATIVE
Urobilinogen, UA: 0.2 (ref 0.0–1.0)
pH: 6 (ref 5.0–8.0)

## 2019-02-22 LAB — VITAMIN D 25 HYDROXY (VIT D DEFICIENCY, FRACTURES): VITD: 48.35 ng/mL (ref 30.00–100.00)

## 2019-02-22 LAB — HEPATIC FUNCTION PANEL
ALT: 13 U/L (ref 0–35)
AST: 14 U/L (ref 0–37)
Albumin: 4.3 g/dL (ref 3.5–5.2)
Alkaline Phosphatase: 93 U/L (ref 39–117)
Bilirubin, Direct: 0.1 mg/dL (ref 0.0–0.3)
Total Bilirubin: 0.5 mg/dL (ref 0.2–1.2)
Total Protein: 7.1 g/dL (ref 6.0–8.3)

## 2019-02-22 LAB — FERRITIN: Ferritin: 73 ng/mL (ref 10.0–291.0)

## 2019-02-22 LAB — FOLATE: Folate: 19.9 ng/mL (ref >5.9)

## 2019-02-22 LAB — TSH: TSH: 2.41 u[IU]/mL (ref 0.35–4.50)

## 2019-02-22 LAB — HEMOGLOBIN A1C: Hgb A1c MFr Bld: 5.6 % (ref 4.6–6.5)

## 2019-02-22 LAB — VITAMIN B12: Vitamin B-12: 739 pg/mL (ref 211–911)

## 2019-02-22 MED ORDER — MIRABEGRON ER 25 MG PO TB24: 25.0000 mg | ORAL_TABLET | Freq: Every day | ORAL | 1 refills | Status: DC

## 2019-02-22 NOTE — Progress Notes (Signed)
Subjective:  Patient ID: Shelley Perez, female    DOB: 04/15/1951  Age: 68 y.o. MRN: [id number]  CC: Annual Exam, Anemia, and Hyperlipidemia   HPI Shelley Perez presents for a CPX.  She has started a walking program since I last saw her.  She does not experience any CP or DOE with exercise.  She does complain that urine leaks and she has urinary urgency while she is walking.  Outpatient Medications Prior to Visit  Medication Sig Dispense Refill  . albuterol (PROVENTIL) (2.5 MG/3ML) 0.083% nebulizer solution Take 3 mLs (2.5 mg total) by nebulization every 6 (six) hours as needed for wheezing or shortness of breath. 75 mL 1  . aspirin 81 MG tablet Take 81 mg by mouth daily.    . budesonide-formoterol (SYMBICORT) 80-4.5 MCG/ACT inhaler Inhale 2 puffs into the lungs 2 (two) times daily. 1 Inhaler 0  . celecoxib (CELEBREX) 100 MG capsule Take 1 capsule (100 mg total) by mouth 2 (two) times daily. 120 capsule 1  . Cholecalciferol 1.25 MG (50000 UT) capsule Take 1 capsule (50,000 Units total) by mouth once a week. 12 capsule 0  . cyanocobalamin 1000 MCG tablet Take 1,000 mcg by mouth daily.    Marland Kitchen dextromethorphan-guaiFENesin (MUCINEX DM) 30-600 MG 12hr tablet Take 1 tablet by mouth 2 (two) times daily as needed for cough.    . famotidine (PEPCID) 20 MG tablet Take 20 mg by mouth 2 (two) times daily.    . furosemide (LASIX) 20 MG tablet TAKE 1 TABLET BY MOUTH  TWICE A DAY 180 tablet 1  . ipratropium-albuterol (DUONEB) 0.5-2.5 (3) MG/3ML SOLN Up to one every 4 hours as needed    . linaclotide (LINZESS) 145 MCG CAPS capsule Take 1 capsule (145 mcg total) by mouth daily as needed. 90 capsule 1  . loratadine (CLARITIN) 10 MG tablet Take 10 mg by mouth daily as needed for allergies.    . Misc. Devices (ACAPELLA) MISC Use as needed.    . Omega-3 1000 MG CAPS Take 1 capsule by mouth daily.    . potassium chloride (K-DUR,KLOR-CON) 10 MEQ tablet TAKE 1 TABLET BY MOUTH  DAILY 90 tablet 1  . sertraline  (ZOLOFT) 100 MG tablet TAKE 1 TABLET BY MOUTH  DAILY 90 tablet 1  . Pitavastatin Calcium (LIVALO PO) Take 1 capsule by mouth daily.     No facility-administered medications prior to visit.     ROS Review of Systems  Constitutional: Positive for fatigue. Negative for chills, diaphoresis, fever and unexpected weight change.  HENT: Negative.   Eyes: Negative for visual disturbance.  Respiratory: Negative.  Negative for cough, chest tightness, shortness of breath and wheezing.   Cardiovascular: Negative for chest pain, palpitations and leg swelling.  Gastrointestinal: Negative for abdominal pain, constipation, diarrhea, nausea and vomiting.  Endocrine: Negative.   Genitourinary: Positive for frequency and urgency. Negative for decreased urine volume, difficulty urinating, dysuria, enuresis, hematuria, pelvic pain and vaginal discharge.  Musculoskeletal: Negative.   Skin: Negative.   Neurological: Negative.  Negative for dizziness, weakness and light-headedness.  Hematological: Negative for adenopathy. Does not bruise/bleed easily.  Psychiatric/Behavioral: Negative.     Objective:  BP 130/70 (BP Location: Left Arm, Patient Position: Sitting, Cuff Size: Large)   Pulse 77   Temp 98.6 F (37 C) (Oral)   Ht 5' 0.75" (1.543 m)   Wt 188 lb (85.3 kg)   SpO2 97%   BMI 35.82 kg/m   BP Readings from Last 3 Encounters:  02/22/19 130/70  09/13/18 130/80  06/12/18 138/80    Wt Readings from Last 3 Encounters:  02/22/19 188 lb (85.3 kg)  09/13/18 193 lb (87.5 kg)  06/12/18 185 lb (83.9 kg)    Physical Exam Vitals signs reviewed.  Constitutional:      Appearance: She is obese. She is not ill-appearing or diaphoretic.  HENT:     Nose: Nose normal. No congestion.     Mouth/Throat:     Mouth: Mucous membranes are moist.  Eyes:     General: No scleral icterus.    Conjunctiva/sclera: Conjunctivae normal.  Neck:     Musculoskeletal: Normal range of motion. No neck rigidity or muscular  tenderness.  Cardiovascular:     Rate and Rhythm: Normal rate and regular rhythm.     Heart sounds: No murmur. No gallop.   Pulmonary:     Effort: Pulmonary effort is normal.     Breath sounds: No stridor. No wheezing, rhonchi or rales.  Abdominal:     General: Abdomen is protuberant. Bowel sounds are normal. There is no distension.     Palpations: There is no hepatomegaly, splenomegaly or mass.     Tenderness: There is no abdominal tenderness. There is no guarding.  Genitourinary:    Comments: GU, breast, rectal exams were deferred at her request. Musculoskeletal: Normal range of motion.     Right lower leg: No edema.     Left lower leg: No edema.  Lymphadenopathy:     Cervical: No cervical adenopathy.  Skin:    General: Skin is warm.     Coloration: Skin is not pale.  Neurological:     General: No focal deficit present.     Mental Status: She is oriented to person, place, and time. Mental status is at baseline.  Psychiatric:        Mood and Affect: Mood normal.        Behavior: Behavior normal.     Lab Results  Component Value Date   WBC 6.8 02/22/2019   HGB 12.7 02/22/2019   HCT 37.9 02/22/2019   PLT 446.0 (H) 02/22/2019   GLUCOSE 111 (H) 02/22/2019   CHOL 211 (H) 02/22/2019   TRIG 110.0 02/22/2019   HDL 60.20 02/22/2019   LDLDIRECT 120.0 02/16/2018   LDLCALC 128 (H) 02/22/2019   ALT 13 02/22/2019   AST 14 02/22/2019   NA 141 02/22/2019   K 4.1 02/22/2019   CL 104 02/22/2019   CREATININE 0.73 02/22/2019   BUN 15 02/22/2019   CO2 29 02/22/2019   TSH 2.41 02/22/2019   INR 1.06 06/29/2015   HGBA1C 5.6 02/22/2019    Mm Diag Breast Tomo Uni Right  Result Date: 05/13/2018 CLINICAL DATA:  68 year old female with focal right axillary pain approximately 2 months ago, improved today but still tender. EXAM: DIGITAL DIAGNOSTIC RIGHT MAMMOGRAM WITH CAD AND TOMO ULTRASOUND RIGHT BREAST COMPARISON:  Previous exam(s). ACR Breast Density Category b: There are scattered  areas of fibroglandular density. FINDINGS: A radiopaque BB was placed at the site of the patient's clinical symptoms in the right axilla. No suspicious mammographic findings are seen deep to the radiopaque BB or within the remainder of the right breast. Mammographic images were processed with CAD. Targeted ultrasound is performed, showing normal tissue without focal or suspicious sonographic finding. Evaluation of the right axilla was performed. Note is made of morphologically normal lymph nodes. IMPRESSION: No suspicious mammographic or sonographic findings corresponding to the patient's clinical symptoms. Recommendation is for clinical and symptomatic follow-up.  RECOMMENDATION: 1. Clinical follow-up recommended for the painful area of concern in the right breast. Any further workup should be based on clinical grounds. 2. Patient is due for annual bilateral screening in March 2020. I have discussed the findings and recommendations with the patient. Results were also provided in writing at the conclusion of the visit. If applicable, a reminder letter will be sent to the patient regarding the next appointment. BI-RADS CATEGORY  1: Negative. Electronically Signed   By: Kristopher Oppenheim M.D.   On: 05/13/2018 15:48   [place] Axilla Right  Result Date: 05/13/2018 CLINICAL DATA:  68 year old female with focal right axillary pain approximately 2 months ago, improved today but still tender. EXAM: DIGITAL DIAGNOSTIC RIGHT MAMMOGRAM WITH CAD AND TOMO ULTRASOUND RIGHT BREAST COMPARISON:  Previous exam(s). ACR Breast Density Category b: There are scattered areas of fibroglandular density. FINDINGS: A radiopaque BB was placed at the site of the patient's clinical symptoms in the right axilla. No suspicious mammographic findings are seen deep to the radiopaque BB or within the remainder of the right breast. Mammographic images were processed with CAD. Targeted ultrasound is performed, showing normal tissue without focal or  suspicious sonographic finding. Evaluation of the right axilla was performed. Note is made of morphologically normal lymph nodes. IMPRESSION: No suspicious mammographic or sonographic findings corresponding to the patient's clinical symptoms. Recommendation is for clinical and symptomatic follow-up. RECOMMENDATION: 1. Clinical follow-up recommended for the painful area of concern in the right breast. Any further workup should be based on clinical grounds. 2. Patient is due for annual bilateral screening in March 2020. I have discussed the findings and recommendations with the patient. Results were also provided in writing at the conclusion of the visit. If applicable, a reminder letter will be sent to the patient regarding the next appointment. BI-RADS CATEGORY  1: Negative. Electronically Signed   By: Kristopher Oppenheim M.D.   On: 05/13/2018 15:48    Assessment & Plan:   Taesha was seen today for annual exam, anemia and hyperlipidemia.  Diagnoses and all orders for this visit:  Thrombocytosis ([place])- Her platelet count remains very mildly elevated.  Work-up for secondary causes is unremarkable.  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  I will continue to monitor this. -     CBC with Differential/Platelet; Future -     IBC panel; Future -     Vitamin B12; Future -     Ferritin; Future  Hyperlipidemia LDL goal <130- She has achieved her LDL goal and is doing well on the statin. -     Lipid panel; Future -     TSH; Future -     Hepatic function panel; Future  Routine general medical examination at a health care facility- Limited exam completed at her request, labs reviewed, vaccines reviewed and updated, mammogram and screening for colon cancer up-to-date.  Vitamin D deficiency -     VITAMIN D 25 Hydroxy (Vit-D Deficiency, Fractures); Future  Deficiency anemia- Her H&H are normal now.  I will monitor her for vitamin deficiencies. -     CBC with Differential/Platelet; Future -      IBC panel; Future -     Vitamin B12; Future -     Vitamin B1; Future -     Folate; Future -     Ferritin; Future  Hyperglycemia -     Basic metabolic panel; Future -     Hemoglobin A1c; Future  OAB (overactive bladder)- Urinalysis is negative for red  blood cells, white blood cells, or glucose.  I will treat her for OAB. -     mirabegron ER (MYRBETRIQ) 25 MG TB24 tablet; Take 1 tablet (25 mg total) by mouth daily. -     Urinalysis, Routine w reflex microscopic; Future -     CULTURE, URINE COMPREHENSIVE; Future   I am having Katrina Stack start on mirabegron ER. I am also having her maintain her aspirin, ipratropium-albuterol, famotidine, Acapella, celecoxib, linaclotide, Omega-3, dextromethorphan-guaiFENesin, loratadine, cyanocobalamin, albuterol, Pitavastatin Calcium (LIVALO PO), budesonide-formoterol, potassium chloride, furosemide, sertraline, and Cholecalciferol.  Meds ordered this encounter  Medications  . mirabegron ER (MYRBETRIQ) 25 MG TB24 tablet    Sig: Take 1 tablet (25 mg total) by mouth daily.    Dispense:  90 tablet    Refill:  1     Follow-up: Return in about 3 months (around 05/25/2019).  Scarlette Calico, MD

## 2019-02-22 NOTE — Patient Instructions (Signed)

## 2019-02-26 ENCOUNTER — Encounter: Payer: Self-pay | Admitting: Internal Medicine

## 2019-02-26 LAB — CULTURE, URINE COMPREHENSIVE
MICRO NUMBER:: 641274
SPECIMEN QUALITY:: ADEQUATE

## 2019-02-26 LAB — VITAMIN B1: Vitamin B1 (Thiamine): 24 nmol/L (ref 8–30)

## 2019-05-02 ENCOUNTER — Other Ambulatory Visit: Payer: Self-pay | Admitting: Internal Medicine

## 2019-05-02 DIAGNOSIS — K5900 Constipation, unspecified: Secondary | ICD-10-CM

## 2019-05-02 MED ORDER — LINACLOTIDE 145 MCG PO CAPS: 145.0000 ug | ORAL_CAPSULE | Freq: Every day | ORAL | 1 refills | Status: DC | PRN

## 2019-05-25 ENCOUNTER — Other Ambulatory Visit: Payer: Self-pay

## 2019-05-25 ENCOUNTER — Ambulatory Visit (INDEPENDENT_AMBULATORY_CARE_PROVIDER_SITE_OTHER): Payer: Medicare Other | Admitting: Internal Medicine

## 2019-05-25 ENCOUNTER — Encounter: Payer: Self-pay | Admitting: Internal Medicine

## 2019-05-25 ENCOUNTER — Other Ambulatory Visit (INDEPENDENT_AMBULATORY_CARE_PROVIDER_SITE_OTHER): Payer: Medicare Other

## 2019-05-25 VITALS — BP 138/78 | HR 77 | Temp 98.5°F | Resp 16 | Ht 60.75 in | Wt 190.0 lb

## 2019-05-25 DIAGNOSIS — N3281 Overactive bladder: Secondary | ICD-10-CM | POA: Diagnosis not present

## 2019-05-25 DIAGNOSIS — M159 Polyosteoarthritis, unspecified: Secondary | ICD-10-CM

## 2019-05-25 DIAGNOSIS — I10 Essential (primary) hypertension: Secondary | ICD-10-CM | POA: Diagnosis not present

## 2019-05-25 DIAGNOSIS — D75839 Thrombocytosis, unspecified: Secondary | ICD-10-CM

## 2019-05-25 DIAGNOSIS — M8949 Other hypertrophic osteoarthropathy, multiple sites: Secondary | ICD-10-CM | POA: Diagnosis not present

## 2019-05-25 DIAGNOSIS — D473 Essential (hemorrhagic) thrombocythemia: Secondary | ICD-10-CM

## 2019-05-25 DIAGNOSIS — E559 Vitamin D deficiency, unspecified: Secondary | ICD-10-CM

## 2019-05-25 LAB — URINALYSIS, ROUTINE W REFLEX MICROSCOPIC
Bilirubin Urine: NEGATIVE
Ketones, ur: NEGATIVE
Leukocytes,Ua: NEGATIVE
Nitrite: NEGATIVE
Specific Gravity, Urine: 1.01 (ref 1.000–1.030)
Total Protein, Urine: NEGATIVE
Urine Glucose: NEGATIVE
Urobilinogen, UA: 0.2 (ref 0.0–1.0)
WBC, UA: NONE SEEN
pH: 6 (ref 5.0–8.0)

## 2019-05-25 LAB — BASIC METABOLIC PANEL WITH GFR
BUN: 16 mg/dL (ref 6–23)
CO2: 25 meq/L (ref 19–32)
Calcium: 9.5 mg/dL (ref 8.4–10.5)
Chloride: 104 meq/L (ref 96–112)
Creatinine, Ser: 0.83 mg/dL (ref 0.40–1.20)
GFR: 68.24 mL/min
Glucose, Bld: 95 mg/dL (ref 70–99)
Potassium: 4.1 meq/L (ref 3.5–5.1)
Sodium: 139 meq/L (ref 135–145)

## 2019-05-25 LAB — CBC WITH DIFFERENTIAL/PLATELET
Basophils Absolute: 0.1 10*3/uL (ref 0.0–0.1)
Basophils Relative: 1 % (ref 0.0–3.0)
Eosinophils Absolute: 0.3 10*3/uL (ref 0.0–0.7)
Eosinophils Relative: 3.1 % (ref 0.0–5.0)
HCT: 38.4 % (ref 36.0–46.0)
Hemoglobin: 12.7 g/dL (ref 12.0–15.0)
Lymphocytes Relative: 35.1 % (ref 12.0–46.0)
Lymphs Abs: 3.1 10*3/uL (ref 0.7–4.0)
MCHC: 33.2 g/dL (ref 30.0–36.0)
MCV: 88.5 fl (ref 78.0–100.0)
Monocytes Absolute: 0.8 10*3/uL (ref 0.1–1.0)
Monocytes Relative: 8.9 % (ref 3.0–12.0)
Neutro Abs: 4.5 10*3/uL (ref 1.4–7.7)
Neutrophils Relative %: 51.9 % (ref 43.0–77.0)
Platelets: 505 10*3/uL — ABNORMAL HIGH (ref 150.0–400.0)
RBC: 4.34 Mil/uL (ref 3.87–5.11)
RDW: 12.9 % (ref 11.5–15.5)
WBC: 8.7 10*3/uL (ref 4.0–10.5)

## 2019-05-25 LAB — VITAMIN D 25 HYDROXY (VIT D DEFICIENCY, FRACTURES): VITD: 42.69 ng/mL (ref 30.00–100.00)

## 2019-05-25 MED ORDER — CELECOXIB 100 MG PO CAPS: 100.0000 mg | ORAL_CAPSULE | Freq: Two times a day (BID) | ORAL | 1 refills | Status: DC

## 2019-05-25 NOTE — Progress Notes (Signed)
Subjective:  Patient ID: Shelley Perez, female    DOB: 31-Oct-1950  Age: 68 y.o. MRN: [id number]  CC: Hypertension and Osteoarthritis   HPI Shelley Perez presents for f/up - She tells me her blood pressure has been well controlled.  She denies any recent episodes of CP, DOE, palpitations, edema, or fatigue.  Outpatient Medications Prior to Visit  Medication Sig Dispense Refill  . albuterol (PROVENTIL) (2.5 MG/3ML) 0.083% nebulizer solution Take 3 mLs (2.5 mg total) by nebulization every 6 (six) hours as needed for wheezing or shortness of breath. 75 mL 1  . aspirin 81 MG tablet Take 81 mg by mouth daily.    . budesonide-formoterol (SYMBICORT) 80-4.5 MCG/ACT inhaler Inhale 2 puffs into the lungs 2 (two) times daily. 1 Inhaler 0  . Cholecalciferol 1.25 MG (50000 UT) capsule Take 1 capsule (50,000 Units total) by mouth once a week. 12 capsule 0  . cyanocobalamin 1000 MCG tablet Take 1,000 mcg by mouth daily.    . furosemide (LASIX) 20 MG tablet TAKE 1 TABLET BY MOUTH  TWICE A DAY 180 tablet 1  . ipratropium-albuterol (DUONEB) 0.5-2.5 (3) MG/3ML SOLN Up to one every 4 hours as needed    . linaclotide (LINZESS) 145 MCG CAPS capsule Take 1 capsule (145 mcg total) by mouth daily as needed. 90 capsule 1  . loratadine (CLARITIN) 10 MG tablet Take 10 mg by mouth daily as needed for allergies.    . mirabegron ER (MYRBETRIQ) 25 MG TB24 tablet Take 1 tablet (25 mg total) by mouth daily. 90 tablet 1  . Misc. Devices (ACAPELLA) MISC Use as needed.    . Omega-3 1000 MG CAPS Take 1 capsule by mouth daily.    . Pitavastatin Calcium (LIVALO PO) Take 1 capsule by mouth daily.    . potassium chloride (K-DUR,KLOR-CON) 10 MEQ tablet TAKE 1 TABLET BY MOUTH  DAILY 90 tablet 1  . celecoxib (CELEBREX) 100 MG capsule Take 1 capsule (100 mg total) by mouth 2 (two) times daily. 120 capsule 1  . dextromethorphan-guaiFENesin (MUCINEX DM) 30-600 MG 12hr tablet Take 1 tablet by mouth 2 (two) times daily as needed for  cough.    . famotidine (PEPCID) 20 MG tablet Take 20 mg by mouth 2 (two) times daily.    . sertraline (ZOLOFT) 100 MG tablet TAKE 1 TABLET BY MOUTH  DAILY 90 tablet 1   No facility-administered medications prior to visit.     ROS Review of Systems  Constitutional: Positive for unexpected weight change. Negative for diaphoresis and fatigue.  HENT: Negative.   Eyes: Negative for visual disturbance.  Respiratory: Negative for cough, chest tightness, shortness of breath and wheezing.   Cardiovascular: Negative for chest pain, palpitations and leg swelling.  Gastrointestinal: Negative for abdominal pain, constipation, diarrhea, nausea and vomiting.  Endocrine: Negative.   Genitourinary: Positive for frequency and urgency. Negative for difficulty urinating and dysuria.  Musculoskeletal: Positive for arthralgias. Negative for back pain and myalgias.  Skin: Negative.  Negative for color change and pallor.  Neurological: Negative.  Negative for dizziness, weakness and light-headedness.  Hematological: Negative for adenopathy. Does not bruise/bleed easily.  Psychiatric/Behavioral: Negative.     Objective:  BP 138/78 (BP Location: Left Arm, Patient Position: Sitting, Cuff Size: Normal)   Pulse 77   Temp 98.5 F (36.9 C) (Oral)   Resp 16   Ht 5' 0.75" (1.543 m)   Wt 190 lb (86.2 kg)   SpO2 96%   BMI 36.20 kg/m   BP Readings  from Last 3 Encounters:  05/25/19 138/78  02/22/19 130/70  09/13/18 130/80    Wt Readings from Last 3 Encounters:  05/25/19 190 lb (86.2 kg)  02/22/19 188 lb (85.3 kg)  09/13/18 193 lb (87.5 kg)    Physical Exam Vitals signs reviewed.  Constitutional:      Appearance: She is obese.  HENT:     Nose: Nose normal.     Mouth/Throat:     Mouth: Mucous membranes are moist.  Eyes:     General: No scleral icterus.    Conjunctiva/sclera: Conjunctivae normal.  Neck:     Musculoskeletal: Normal range of motion and neck supple.  Cardiovascular:     Rate and  Rhythm: Normal rate and regular rhythm.     Heart sounds: No murmur.  Pulmonary:     Effort: Pulmonary effort is normal.     Breath sounds: No stridor. No wheezing, rhonchi or rales.  Abdominal:     General: Abdomen is flat. Bowel sounds are normal. There is no distension.     Palpations: There is no hepatomegaly or splenomegaly.     Tenderness: There is no abdominal tenderness.  Musculoskeletal: Normal range of motion.     Right lower leg: No edema.     Left lower leg: No edema.  Lymphadenopathy:     Cervical: No cervical adenopathy.  Skin:    General: Skin is warm and dry.  Neurological:     General: No focal deficit present.     Mental Status: She is alert.  Psychiatric:        Mood and Affect: Mood normal.        Behavior: Behavior normal.     Lab Results  Component Value Date   WBC 8.7 05/25/2019   HGB 12.7 05/25/2019   HCT 38.4 05/25/2019   PLT 505.0 (H) 05/25/2019   GLUCOSE 95 05/25/2019   CHOL 211 (H) 02/22/2019   TRIG 110.0 02/22/2019   HDL 60.20 02/22/2019   LDLDIRECT 120.0 02/16/2018   LDLCALC 128 (H) 02/22/2019   ALT 13 02/22/2019   AST 14 02/22/2019   NA 139 05/25/2019   K 4.1 05/25/2019   CL 104 05/25/2019   CREATININE 0.83 05/25/2019   BUN 16 05/25/2019   CO2 25 05/25/2019   TSH 2.41 02/22/2019   INR 1.06 06/29/2015   HGBA1C 5.6 02/22/2019    Mm Diag Breast Tomo Uni Right  Result Date: 05/13/2018 CLINICAL DATA:  68 year old female with focal right axillary pain approximately 2 months ago, improved today but still tender. EXAM: DIGITAL DIAGNOSTIC RIGHT MAMMOGRAM WITH CAD AND TOMO ULTRASOUND RIGHT BREAST COMPARISON:  Previous exam(s). ACR Breast Density Category b: There are scattered areas of fibroglandular density. FINDINGS: A radiopaque BB was placed at the site of the patient's clinical symptoms in the right axilla. No suspicious mammographic findings are seen deep to the radiopaque BB or within the remainder of the right breast. Mammographic  images were processed with CAD. Targeted ultrasound is performed, showing normal tissue without focal or suspicious sonographic finding. Evaluation of the right axilla was performed. Note is made of morphologically normal lymph nodes. IMPRESSION: No suspicious mammographic or sonographic findings corresponding to the patient's clinical symptoms. Recommendation is for clinical and symptomatic follow-up. RECOMMENDATION: 1. Clinical follow-up recommended for the painful area of concern in the right breast. Any further workup should be based on clinical grounds. 2. Patient is due for annual bilateral screening in March 2020. I have discussed the findings and recommendations with  the patient. Results were also provided in writing at the conclusion of the visit. If applicable, a reminder letter will be sent to the patient regarding the next appointment. BI-RADS CATEGORY  1: Negative. Electronically Signed   By: Kristopher Oppenheim M.D.   On: 05/13/2018 15:48   [place] Axilla Right  Result Date: 05/13/2018 CLINICAL DATA:  68 year old female with focal right axillary pain approximately 2 months ago, improved today but still tender. EXAM: DIGITAL DIAGNOSTIC RIGHT MAMMOGRAM WITH CAD AND TOMO ULTRASOUND RIGHT BREAST COMPARISON:  Previous exam(s). ACR Breast Density Category b: There are scattered areas of fibroglandular density. FINDINGS: A radiopaque BB was placed at the site of the patient's clinical symptoms in the right axilla. No suspicious mammographic findings are seen deep to the radiopaque BB or within the remainder of the right breast. Mammographic images were processed with CAD. Targeted ultrasound is performed, showing normal tissue without focal or suspicious sonographic finding. Evaluation of the right axilla was performed. Note is made of morphologically normal lymph nodes. IMPRESSION: No suspicious mammographic or sonographic findings corresponding to the patient's clinical symptoms. Recommendation is for clinical  and symptomatic follow-up. RECOMMENDATION: 1. Clinical follow-up recommended for the painful area of concern in the right breast. Any further workup should be based on clinical grounds. 2. Patient is due for annual bilateral screening in March 2020. I have discussed the findings and recommendations with the patient. Results were also provided in writing at the conclusion of the visit. If applicable, a reminder letter will be sent to the patient regarding the next appointment. BI-RADS CATEGORY  1: Negative. Electronically Signed   By: Kristopher Oppenheim M.D.   On: 05/13/2018 15:48    Assessment & Plan:   Bette was seen today for hypertension and osteoarthritis.  Diagnoses and all orders for this visit:  Thrombocytosis ([place])- Her platelet count remains mildly elevated but is stable over the last 2 years.  Her other cell lines are all normal and she is asymptomatic.  This is consistent with a benign condition.  Will continue to monitor her platelet count in the future. -     CBC with Differential/Platelet; Future  OAB (overactive bladder)- Her UA is normal and she is getting symptom relief with Myrbetriq. -     Urinalysis, Routine w reflex microscopic; Future  Vitamin D deficiency -     VITAMIN D 25 Hydroxy (Vit-D Deficiency, Fractures); Future  Essential hypertension- her blood pressure is adequately well controlled. -     Basic metabolic panel; Future  Primary osteoarthritis involving multiple joints -     celecoxib (CELEBREX) 100 MG capsule; Take 1 capsule (100 mg total) by mouth 2 (two) times daily.   I have discontinued Fotini Frisbee's famotidine, dextromethorphan-guaiFENesin, and sertraline. I am also having her maintain her aspirin, ipratropium-albuterol, Acapella, Omega-3, loratadine, cyanocobalamin, albuterol, Pitavastatin Calcium (LIVALO PO), budesonide-formoterol, potassium chloride, furosemide, Cholecalciferol, mirabegron ER, linaclotide, and celecoxib.  Meds ordered this  encounter  Medications  . celecoxib (CELEBREX) 100 MG capsule    Sig: Take 1 capsule (100 mg total) by mouth 2 (two) times daily.    Dispense:  180 capsule    Refill:  1     Follow-up: Return in about 6 months (around 11/23/2019).  Scarlette Calico, MD

## 2019-05-25 NOTE — Patient Instructions (Signed)

## 2019-05-26 ENCOUNTER — Encounter: Payer: Self-pay | Admitting: Internal Medicine

## 2019-06-02 ENCOUNTER — Telehealth: Payer: Self-pay | Admitting: Internal Medicine

## 2019-06-02 DIAGNOSIS — I10 Essential (primary) hypertension: Secondary | ICD-10-CM

## 2019-06-02 NOTE — Telephone Encounter (Signed)
°  Relation to pt: self  Call back number: [phone]  Reason for call:  Patient requesting 90 day supply potassium chloride (K-DUR,KLOR-CON) 10 MEQ tablet and furosemide (LASIX) 20 MG tablet    [place], [place] [phone] (Phone) [phone] (Fax)

## 2019-06-03 MED ORDER — POTASSIUM CHLORIDE CRYS ER 10 MEQ PO TBCR: 10.0000 meq | EXTENDED_RELEASE_TABLET | Freq: Every day | ORAL | 1 refills | Status: DC

## 2019-06-03 MED ORDER — FUROSEMIDE 20 MG PO TABS: ORAL_TABLET | ORAL | 1 refills | Status: DC

## 2019-06-03 NOTE — Telephone Encounter (Signed)
Refills sent. Left detailed message informing pt.

## 2019-06-14 ENCOUNTER — Other Ambulatory Visit: Payer: Self-pay

## 2019-06-14 ENCOUNTER — Encounter: Payer: Self-pay | Admitting: Internal Medicine

## 2019-06-14 ENCOUNTER — Ambulatory Visit: Payer: Medicare Other | Admitting: Internal Medicine

## 2019-06-14 DIAGNOSIS — J449 Chronic obstructive pulmonary disease, unspecified: Secondary | ICD-10-CM

## 2019-06-14 MED ORDER — BUDESONIDE-FORMOTEROL FUMARATE 80-4.5 MCG/ACT IN AERO: INHALATION_SPRAY | RESPIRATORY_TRACT | 11 refills | Status: DC

## 2019-06-14 NOTE — Patient Instructions (Addendum)
No changes in medications.   See calendar for specific medication instructions and bring it back for each and every office visit for every healthcare provider you see.  Without it,  you may not receive the best quality medical care that we feel you deserve.  You will note that the calendar groups together  your maintenance  medications that are timed at particular times of the day.  Think of this as your checklist for what your doctor has instructed you to do until your next evaluation to see what benefit  there is  to staying on a consistent group of medications intended to keep you well.  The other group at the bottom is entirely up to you to use as you see fit  for specific symptoms that may arise between visits that require you to treat them on an as needed basis.  Think of this as your action plan or "what if" list.   Separating the top medications from the bottom group is fundamental to providing you adequate care going forward.    Dr Jeral Fruit is a radiologist - please disregard his recommendations.   Please schedule a follow up visit in 12 months but call sooner if needed

## 2019-06-14 NOTE — Progress Notes (Signed)
Subjective:    Patient ID: Shelley Perez, female    DOB: [birth year],   MRN: [id number]    Brief patient profile:  68 yowf quit smoking 2015 with some sensation of midline chest discomfort brought on by smoking that resolved @ 172lb p quit smoking with no breathing problems though tendency for colds going to chest and persistent sore throat since ET 11/22/116 for R knee then  worst episode middle of Feb 2017 >  sev ov's rx as flu with tamiflu / pred/ abx/ only some better so referred to pulmonary clinic 11/22/2015 by Dr  Philis Fendt at [facility]   History of Present Illness  11/22/2015 1st [facility] office visit/ Urie Loughner   Chief Complaint  Patient presents with  . Pulmonary Consult    Referred by Dr. Philis Fendt. Pt c/o cough and SOB since she was dxed with Influenza in Feb 2017. Cough is prod with yellow sputum.    rx with doxy and prednisone x 4 days and qvar not better. Cough is worse at hs and in am and doe x more than slow walk on flat level = MMRC2 Having to use neb saba qid whereas prior to feb 2017 was not using it at all and prev on no maint rx  rec Plan A = Automatic =  symbicort 160 Take 2 puffs first thing in am and then another 2 puffs about 12 hours later.  Plan B = Backup  ok to use the nebulizer up to every 4 hours but if start needing it regularly call for immediate appointment GERD diet      12/28/2015  f/u ov/Ayrianna Mcginniss re: GOLD 0 copd/ mild AB/uacs Chief Complaint  Patient presents with  . Follow-up    Breathing has improved. She has not had to use albuterol inhaler or neb.   can't take ppi regularly due to joints aching  Not taking pepcid at all  rec You do have significant copd and unlikely you ever will  Continue symbicort 80 Take 2 puffs first thing in am and then another 2 puffs about 12 hours later if you think you need it.  Try pepcid ac 20 mg after bfast and after supper  GERD diet     05/21/2017  f/u ov/Mariselda Badalamenti re:  GOLD 0 copd/ AB with prob UACS  Chief  Complaint  Patient presents with  . Hospitalization Follow-up    Pt in hospital 05/13/17-05/15/17 due to COPD. States that she is still coughing with yellow to white mucus and c/o SOB all the time that is worse early in the morning and at night. Pt states that she is sore in the chest due to being hospitalized.  after last visit did fine on symbicort 80 2pffs daily but stopped it after several months but still had trouble had trouble with mowing grass due to cough/ sob and did not call as better when avoided exposure  [place] since late summer  2018 with recurrent cough/sob  eval by Ronnald Ramp 04/28/17 and no better p omnicef and prednisone  admtted 9/26-28/ 2018  [place] with dx "aecopd" rx with steroids and duoneb and better but not back to baseline ex tol and "never want to get that bad again. / not on any gerd rx  Still with coughing fits/ quite harsh with obvious pseudowheeze  And can't sleep flat due to cough and chest gen sore ant with cough rec For cough > mucinex dm up to 1200 mg every 12 hours with flutter valve as much  as possible  And if still coughing then use your cough syrup as needed   Plan A = Automatic = symbicort  80 Take 2 puffs first thing in am and then another 2 puffs about 12 hours later.  Pecpid 20 mg after bfast and supper  Work on inhaler technique:   GERD   Plan B = Backup Only use your nebulizer as a rescue medication  Prednisone 10 mg take  4 each am x 2 days,   2 each am x 2 days,  1 each am x 2 days and stop      09/10/2017  f/u ov/Kortney Potvin re:  Cough flare since summer of 2018  variant asthma vs uacs  Chief Complaint  Patient presents with  . Follow-up    Breathing is doing well and she has not needed her neb. No new co's.   main issue is nasal obst at hs, not using  Any not nasal sprays / not using bed blocks  Overt hb symptoms with mild orophanygeal sense of dysphagia  Not limited by breathing from desired activities   rec Continue Plan A = Automatic =  symbicort  80 Take 2 puffs first thing in am and then another 2 puffs about 12 hours later.  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 Follow-up    Cough is "pretty good" only has occ cough now- occ prod with clear sputum.  She rarely uses neb. She states she had a fall approx 2 months ago and fractured 2 ribs on the left.   Dyspnea:  Worse = MMRC1 = can walk nl pace, flat grade, can't hurry or go uphills or steps s sob   Cough: much better Sleep: about 10 degrees  SABA use:  Not using at all  Still hurting on L where fx 2 ribs  - pain down to a 2/10  s hematoma/bruising    09/13/2018  f/u ov/Damaso Laday re: copd GOLD 0  Chief Complaint  Patient presents with  . Follow-up    Breathing is doing well. She rarely uses her duoneb.   Dyspnea:  R Heels stops her before beathing Cough: none Sleeping: about 10 degrees hob up  SABA use: rarely  02: none  rec Change symbicort to 80 2 pffs each am and the other 2 puffs are optional  Work on inhaler technique:       06/14/2019  f/u ov/Inge Waldroup re:  COPD GOLD 0 / symb 80 2 each am  Chief Complaint  Patient presents with  . Follow-up    Breathing is doing well. No new co's. She has not used neb recently.   Dyspnea:  2 miles s stopping  Cough: some am congestion ? Worse if misses symbicort / white mucus Sleeping: 10 degrees wedge pillow SABA use: no need for neb 02: none   No obvious day to day or daytime variability or assoc excess/ purulent  sputum or mucus plugs or hemoptysis or cp or chest  tightness, subjective wheeze or overt sinus or hb symptoms.   Sleeping  without nocturnal   exacerbation  of respiratory  c/o's or need for noct saba. Also denies any obvious fluctuation of symptoms with weather or environmental changes or other aggravating or alleviating factors except as outlined above   No unusual exposure hx or h/o childhood pna/ asthma or knowledge of premature birth.  Current Allergies, Complete Past Medical History, Past Surgical History, Family History, and Social History were reviewed in [employer] record.  ROS  The following are not active complaints unless bolded Hoarseness, sore throat, dysphagia, dental problems, itching, sneezing,  nasal congestion or discharge of excess mucus or purulent secretions, ear ache,   fever, chills, sweats, unintended wt loss or wt gain, classically pleuritic or exertional cp,  orthopnea pnd or arm/hand swelling  or leg swelling, presyncope, palpitations, abdominal pain, anorexia, nausea, vomiting, diarrhea  or change in bowel habits or change in bladder habits, change in stools or change in urine, dysuria, hematuria,  rash, arthralgias, visual complaints, headache, numbness, weakness or ataxia or problems with walking or coordination,  change in mood or  memory.        Current Meds  Medication Sig  . albuterol (PROVENTIL) (2.5 MG/3ML) 0.083% nebulizer solution Take 3 mLs (2.5 mg total) by nebulization every 6 (six) hours as needed for wheezing or shortness of breath.  . Ascorbic Acid (VITAMIN C PO) Take 1 tablet by mouth daily.  Marland Kitchen aspirin 81 MG tablet Take 81 mg by mouth daily.  . budesonide-formoterol (SYMBICORT) 80-4.5 MCG/ACT inhaler Inhale 2 puffs into the lungs 2 (two) times daily.  . celecoxib (CELEBREX) 100 MG capsule Take 1 capsule (100 mg total) by mouth 2 (two) times daily.  . Cholecalciferol 1.25 MG (50000 UT) capsule Take 1 capsule (50,000 Units total) by mouth once a week.  . cyanocobalamin 1000 MCG  tablet Take 1,000 mcg by mouth daily.  Marland Kitchen dextromethorphan-guaiFENesin (MUCINEX DM) 30-600 MG 12hr tablet Take 1 tablet by mouth 2 (two) times daily as needed for cough.  . furosemide (LASIX) 20 MG tablet TAKE 1 TABLET BY MOUTH  TWICE A DAY  . linaclotide (LINZESS) 145 MCG CAPS capsule Take 1 capsule (145 mcg total) by mouth daily as needed.  Marland Kitchen MAGNESIUM PO Take 1 tablet by mouth daily.  . mirabegron ER (MYRBETRIQ) 25 MG TB24 tablet Take 1 tablet (25 mg total) by mouth daily.  . Misc. Devices (ACAPELLA) MISC Use as needed.  . Multiple Vitamins-Minerals (ZINC PO) Take 1 tablet by mouth daily.  . potassium chloride (KLOR-CON) 10 MEQ tablet Take 1 tablet (10 mEq total) by mouth daily.  . TURMERIC PO Take 1 capsule by mouth daily.               Objective:   Physical Exam     06/14/2019     190  09/13/2018       193  12/10/2017       191  09/10/2017       191  05/21/2017       185  12/28/2015       187  12/11/2015        187   11/22/15 191 lb 12.8 oz (87 kg)  11/11/15 184 lb 12.8 oz (83.825 kg)  11/10/15 187 lb (84.823 kg)          full top denture    HEENT :  pt wearing mask not removed for exam due to covid -19 concerns.    NECK :  without JVD/Nodes/TM/ nl carotid upstrokes bilaterally   LUNGS: no acc muscle use,  Nl contour chest which is clear to A and P bilaterally without cough on insp or exp maneuvers   CV:  RRR  no s3 or murmur or increase in P2, and no edema   ABD:  Mod obese soft and nontender with nl inspiratory excursion in the supine position. No bruits or organomegaly appreciated, bowel sounds nl  MS:  Nl gait/ ext warm without deformities, calf tenderness, cyanosis or clubbing No obvious joint restrictions   SKIN: warm and dry without lesions    NEURO:  alert, approp, nl sensorium with  no motor or cerebellar deficits apparent.                 Assessment & Plan:

## 2019-06-14 NOTE — Assessment & Plan Note (Signed)
Quit smoking 2015 11/22/2015   try symbicort 160 2bid  - 12/11/2015   try reducing to symb 80 2bid  - PFT's  12/28/2015  FEV1 1.95 (91 % ) ratio 85  p 6 % improvement from saba p no rx prior to study with DLCO  85 % corrects to 106 % for alv volume    - 09/13/2018  After extensive coaching inhaler device,  effectiveness =    75%   All goals of chronic asthma control met including optimal function and elimination of symptoms with minimal need for rescue therapy.  Contingencies discussed in full including contacting this office immediately if not controlling the symptoms using the rule of two's.     Based on two studies from NEJM  378; 20 p 1865 (2018) and 380 : p2020-30 (2019) in pts with mild asthma it is reasonable to use low dose symbicort eg 80 2bid "prn" flare in this setting but I emphasized this was only shown with symbicort and takes advantage of the rapid onset of action but is not the same as "rescue therapy" but can be stopped once the acute symptoms have resolved and the need for rescue has been minimized (< 2 x weekly)    Since having more am congestion on lower doses of symb suggested she first try the  2 bid dosing ofr at least a week to get the full advantage of both components of symbicort to see if has effect on am symptoms or not   Overall though doing great on present rx  I had an extended discussion with the patient reviewing all relevant studies completed to date and  lasting 15 to 20 minutes of a 25 minute visit    Each maintenance medication was reviewed in detail including most importantly the difference between maintenance and prns and under what circumstances the prns are to be triggered using an action plan format that is not reflected in the computer generated alphabetically organized AVS but trather by a customized med calendar that reflects the AVS meds with confirmed 100% correlation.   In addition, Please see AVS for unique instructions that I personally wrote and  verbalized to the the pt in detail and then reviewed with pt  by my nurse highlighting any  changes in therapy recommended at today's visit to their plan of care.

## 2019-06-16 NOTE — Telephone Encounter (Signed)
Patient states the potassium medication is wrong and the wrong potassium was sent to the pharmacy.  Patient Is confused on how she was prescribed this potassium medication.  The pharmacy([facility]) is stating that this is not the correct medication either. Patient is requesting a call back from PCP asap.  Patient states she has not had her potassium or her furosemide. meds until 10/17 Patient states she is swelling. Offered to get a triage nurse on the phone but patient denied.   [facility] [phone]  Patient Call back [phone]

## 2019-06-16 NOTE — Telephone Encounter (Addendum)
[phone]   Spoke to pt and she stated that [facility] is not able to process the refill until they speak to [place].   Contacted [facility] - spoke and gave verbal clarification to pharmacy to keep the rx as previously prescribed (per patients request).   According to the pharmacy, the previous rx was for the crystal particulate formulation. The name is Klor Con M 10 meq. I found an rx description for this and sent in with note to the pharmacy.

## 2019-06-17 MED ORDER — POTASSIUM CHLORIDE ER 10 MEQ PO TBCR: 10.0000 meq | EXTENDED_RELEASE_TABLET | Freq: Every day | ORAL | 1 refills | Status: DC

## 2019-06-17 NOTE — Addendum Note (Signed)
Addended by: Karle Barr on: 06/17/2019 10:48 AM   Modules accepted: Orders

## 2019-06-20 ENCOUNTER — Other Ambulatory Visit: Payer: Self-pay | Admitting: Internal Medicine

## 2019-06-20 DIAGNOSIS — I10 Essential (primary) hypertension: Secondary | ICD-10-CM

## 2019-07-05 DIAGNOSIS — H04123 Dry eye syndrome of bilateral lacrimal glands: Secondary | ICD-10-CM | POA: Diagnosis not present

## 2019-07-05 DIAGNOSIS — H18513 Endothelial corneal dystrophy, bilateral: Secondary | ICD-10-CM | POA: Diagnosis not present

## 2019-07-05 DIAGNOSIS — H16213 Exposure keratoconjunctivitis, bilateral: Secondary | ICD-10-CM | POA: Diagnosis not present

## 2019-07-05 DIAGNOSIS — H2513 Age-related nuclear cataract, bilateral: Secondary | ICD-10-CM | POA: Diagnosis not present

## 2019-10-10 ENCOUNTER — Encounter: Payer: Self-pay | Admitting: Family

## 2019-10-10 ENCOUNTER — Other Ambulatory Visit: Payer: Self-pay

## 2019-10-10 ENCOUNTER — Ambulatory Visit (INDEPENDENT_AMBULATORY_CARE_PROVIDER_SITE_OTHER): Payer: Medicare Other | Admitting: Family

## 2019-10-10 VITALS — BP 130/78 | HR 72 | Temp 98.1°F | Ht 60.0 in | Wt 194.2 lb

## 2019-10-10 DIAGNOSIS — R319 Hematuria, unspecified: Secondary | ICD-10-CM

## 2019-10-10 DIAGNOSIS — R300 Dysuria: Secondary | ICD-10-CM | POA: Diagnosis not present

## 2019-10-10 LAB — POC URINALSYSI DIPSTICK (AUTOMATED)
Bilirubin, UA: NEGATIVE
Glucose, UA: NEGATIVE
Ketones, UA: NEGATIVE
Leukocytes, UA: NEGATIVE
Nitrite, UA: NEGATIVE
Protein, UA: NEGATIVE
Spec Grav, UA: 1.025 (ref 1.010–1.025)
Urobilinogen, UA: 0.2 E.U./dL
pH, UA: 5.5 (ref 5.0–8.0)

## 2019-10-10 MED ORDER — NITROFURANTOIN MONOHYD MACRO 100 MG PO CAPS: 100.0000 mg | ORAL_CAPSULE | Freq: Two times a day (BID) | ORAL | 0 refills | Status: DC

## 2019-10-10 NOTE — Addendum Note (Signed)
Addended by: Marcina Millard on: 10/10/2019 04:48 PM   Modules accepted: Orders

## 2019-10-10 NOTE — Progress Notes (Signed)
Shelley Perez is a 69 y.o. female with the following history as recorded in EpicCare:  Patient Active Problem List   Diagnosis Date Noted  . Essential hypertension 05/25/2019  . Hyperglycemia 02/22/2019  . OAB (overactive bladder) 02/22/2019  . COPD GOLD 0/ AB 09/15/2018  . Mastodynia of right breast 04/28/2018  . Asthma 04/09/2018  . Allergic rhinitis 04/09/2018  . Routine general medical examination at a health care facility 02/17/2018  . Atherosclerosis of aorta ([place]) 02/16/2018  . Chronic calcific pancreatitis ([place]) 01/01/2018  . 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 Constipation 01/21/2017  . Hyperlipidemia LDL goal <130 01/21/2017  . Thrombocytosis ([place]) 01/21/2017  . Gastroesophageal reflux disease with esophagitis 01/29/2016  . Osteoporosis 01/29/2016  . Vitamin D deficiency 01/29/2016  . Menopause ovarian failure 01/29/2016  . Upper airway cough syndrome 12/12/2015  . Abnormal CT of the chest 11/22/2015  . COPD with acute exacerbation ([place]) 11/21/2015  . Class 2 obesity with body mass index (BMI) of 36.0 to 36.9 in adult 07/11/2015  . Depression with anxiety 04/19/2011    Current Outpatient Medications  Medication Sig Dispense Refill  . albuterol (PROVENTIL) (2.5 MG/3ML) 0.083% nebulizer solution Take 3 mLs (2.5 mg total) by nebulization every 6 (six) hours as needed for wheezing or shortness of breath. 75 mL 1  . Ascorbic Acid (VITAMIN C PO) Take 1 tablet by mouth daily.    Marland Kitchen aspirin 81 MG tablet Take 81 mg by mouth daily.    . budesonide-formoterol (SYMBICORT) 80-4.5 MCG/ACT inhaler Take 2 puffs first thing in am and then another 2 puffs about 12 hours later. 1 Inhaler 11  . celecoxib (CELEBREX) 100 MG capsule Take 1 capsule (100 mg total) by mouth 2 (two) times daily. 180 capsule 1  . Cholecalciferol 1.25 MG (50000 UT) capsule Take 1 capsule (50,000 Units total) by mouth once a week. 12 capsule 0  . Coenzyme Q10 100 MG capsule Take by mouth.    . cyanocobalamin 1000 MCG  tablet Take 1,000 mcg by mouth daily.    Marland Kitchen dextromethorphan-guaiFENesin (MUCINEX DM) 30-600 MG 12hr tablet Take 1 tablet by mouth 2 (two) times daily as needed for cough.    . diclofenac Sodium (VOLTAREN) 1 % GEL Apply topically.    . furosemide (LASIX) 20 MG tablet TAKE 1 TABLET BY MOUTH  TWICE A DAY 180 tablet 1  . ipratropium-albuterol (DUONEB) 0.5-2.5 (3) MG/3ML SOLN Up to one every 4 hours as needed    . linaclotide (LINZESS) 145 MCG CAPS capsule Take 1 capsule (145 mcg total) by mouth daily as needed. 90 capsule 1  . MAGNESIUM PO Take 1 tablet by mouth daily.    . mirabegron ER (MYRBETRIQ) 25 MG TB24 tablet Take 1 tablet (25 mg total) by mouth daily. 90 tablet 1  . Misc. Devices (ACAPELLA) MISC Use as needed.    . Multiple Vitamins-Minerals (ZINC PO) Take 1 tablet by mouth daily.    . Omega-3 Fatty Acids (FISH OIL) 1000 MG CAPS Take by mouth.    . potassium chloride (KLOR-CON) 10 MEQ tablet Take 1 tablet (10 mEq total) by mouth daily. 90 tablet 1  . TURMERIC PO Take 1 capsule by mouth daily.    . nitrofurantoin, macrocrystal-monohydrate, (MACROBID) 100 MG capsule Take 1 capsule (100 mg total) by mouth 2 (two) times daily. 14 capsule 0   No current facility-administered medications for this visit.    Allergies: Rosuvastatin calcium, Atorvastatin, Codeine, Crestor [rosuvastatin], Prozac [fluoxetine hcl], Latex, and Sulfa antibiotics  Past  Medical History:  Diagnosis Date  . Anemia   . Anxiety   . Arthritis   . Asthma   . Complication of anesthesia    "they gave me too much and I had to stay 3 days " 2005 after choley   . COPD (chronic obstructive pulmonary disease) ([place])   . Depression   . Fibromyalgia   . History of kidney stones   . Hx of pancreatitis    with gallstones  . Hyperlipidemia   . Insomnia   . Osteoporosis     Past Surgical History:  Procedure Laterality Date  . c sections    . CHOLECYSTECTOMY    . DENTAL SURGERY    . FOOT SURGERY    . KNEE SURGERY  1978 /  1971  . SHOULDER SURGERY    . TOTAL KNEE ARTHROPLASTY Right 07/10/2015   Procedure: RIGHT TOTAL KNEE ARTHROPLASTY;  Surgeon: Paralee Cancel, MD;  Location: [facility];  Service: Orthopedics;  Laterality: Right;    Family History  Problem Relation Age of Onset  . Hyperlipidemia Mother   . Hypertension Mother   . Heart attack Mother   . Cancer Father   . Asthma Sister   . Lung cancer Sister        smoked    Social History   Tobacco Use  . Smoking status: Former Smoker    Packs/day: 1.00    Years: 40.00    Pack years: 40.00    Types: Cigarettes    Quit date: 06/28/2013    Years since quitting: 6.2  . Smokeless tobacco: Never Used  Substance Use Topics  . Alcohol use: No    Alcohol/week: 0.0 standard drinks    Subjective:   UTI symptoms x 3 days; + frequency, pelvic pain, strong odor; no fever; + sense of incomplete emptying;  No blood in urine; not prone to UTIs; Does take Myrbetriq;  Has had kidney stone in the past;   Objective:  Vitals:   10/10/19 1548  BP: 130/78  Pulse: 72  Temp: 98.1 F (36.7 C)  TempSrc: Oral  SpO2: 98%  Weight: 194 lb 3.2 oz (88.1 kg)  Height: 5' (1.524 m)    General: Well developed, well nourished, in no acute distress  Skin : Warm and dry.  Head: Normocephalic and atraumatic  Lungs: Respirations unlabored;  Neurologic: Alert and oriented; speech intact; face symmetrical; moves all extremities well; CNII-XII intact without focal deficit   Assessment:  1. Dysuria   2. Hematuria, unspecified type     Plan:  Check U/A and urine culture; Rx for Macrobid 100 mg bid x 7 days; discussed concern for kidney stone and patient does not want CT today; will re-evaluate her symptoms and response once urine culture results are back; follow-up to be determined;   This visit occurred during the SARS-CoV-2 public health emergency.  Safety protocols were in place, including screening questions prior to the visit, additional usage of staff PPE, and extensive  cleaning of exam room while observing appropriate contact time as indicated for disinfecting solutions.      No follow-ups on file.  Orders Placed This Encounter  Procedures  . Urine Culture  . CT RENAL STONE STUDY    Standing Status:   Future    Standing Expiration Date:   01/06/2021    Order Specific Question:   Preferred imaging location?    Answer:   [id number]    Order Specific Question:   Radiology Contrast Protocol -  do NOT remove file path    Answer:   [email or web address]    Requested Prescriptions   Signed Prescriptions Disp Refills  . nitrofurantoin, macrocrystal-monohydrate, (MACROBID) 100 MG capsule 14 capsule 0    Sig: Take 1 capsule (100 mg total) by mouth 2 (two) times daily.

## 2019-10-11 LAB — URINE CULTURE: Result:: NO GROWTH

## 2019-10-26 ENCOUNTER — Encounter: Payer: Self-pay | Admitting: Gastroenterology

## 2019-10-26 DIAGNOSIS — R311 Benign essential microscopic hematuria: Secondary | ICD-10-CM | POA: Diagnosis not present

## 2019-10-26 DIAGNOSIS — R310 Gross hematuria: Secondary | ICD-10-CM | POA: Diagnosis not present

## 2019-10-31 DIAGNOSIS — L218 Other seborrheic dermatitis: Secondary | ICD-10-CM | POA: Diagnosis not present

## 2019-10-31 DIAGNOSIS — B078 Other viral warts: Secondary | ICD-10-CM | POA: Diagnosis not present

## 2019-11-02 ENCOUNTER — Encounter: Payer: Self-pay | Admitting: Internal Medicine

## 2019-11-02 ENCOUNTER — Encounter: Payer: Self-pay | Admitting: Gastroenterology

## 2019-11-02 DIAGNOSIS — R3129 Other microscopic hematuria: Secondary | ICD-10-CM | POA: Diagnosis not present

## 2019-11-02 DIAGNOSIS — R311 Benign essential microscopic hematuria: Secondary | ICD-10-CM | POA: Diagnosis not present

## 2019-11-21 DIAGNOSIS — R311 Benign essential microscopic hematuria: Secondary | ICD-10-CM | POA: Diagnosis not present

## 2019-11-23 ENCOUNTER — Ambulatory Visit: Payer: Medicare Other | Admitting: Internal Medicine

## 2019-11-25 ENCOUNTER — Encounter: Payer: Self-pay | Admitting: Gastroenterology

## 2019-11-28 DIAGNOSIS — B070 Plantar wart: Secondary | ICD-10-CM | POA: Diagnosis not present

## 2019-11-30 ENCOUNTER — Telehealth: Payer: Self-pay | Admitting: Internal Medicine

## 2019-11-30 NOTE — Telephone Encounter (Signed)
New message:   Pt's daughter is calling and states the patient is needing a letter to give to the post office that she is physically disabled and unable to walk to the road to get her mail. The daughter states that her current mailbox is on the patients house and she has to have a letter with our letter head and from her doctor for it to remain that way. Please advise.

## 2019-12-01 NOTE — Telephone Encounter (Signed)
Pt informed that letter was ready for pick up

## 2019-12-05 ENCOUNTER — Other Ambulatory Visit: Payer: Self-pay | Admitting: Family Medicine

## 2019-12-05 DIAGNOSIS — Z1231 Encounter for screening mammogram for malignant neoplasm of breast: Secondary | ICD-10-CM

## 2019-12-09 ENCOUNTER — Ambulatory Visit: Payer: Medicare Other | Admitting: Nurse Practitioner

## 2019-12-23 ENCOUNTER — Encounter: Payer: Self-pay | Admitting: Gastroenterology

## 2019-12-23 ENCOUNTER — Other Ambulatory Visit (INDEPENDENT_AMBULATORY_CARE_PROVIDER_SITE_OTHER): Payer: Medicare Other

## 2019-12-23 ENCOUNTER — Other Ambulatory Visit: Payer: Self-pay

## 2019-12-23 ENCOUNTER — Ambulatory Visit: Payer: Medicare Other | Admitting: Gastroenterology

## 2019-12-23 VITALS — BP 120/80 | HR 58 | Temp 98.4°F | Ht 61.0 in | Wt 194.0 lb

## 2019-12-23 DIAGNOSIS — K766 Portal hypertension: Secondary | ICD-10-CM

## 2019-12-23 DIAGNOSIS — R1031 Right lower quadrant pain: Secondary | ICD-10-CM | POA: Diagnosis not present

## 2019-12-23 DIAGNOSIS — K5909 Other constipation: Secondary | ICD-10-CM

## 2019-12-23 DIAGNOSIS — R933 Abnormal findings on diagnostic imaging of other parts of digestive tract: Secondary | ICD-10-CM | POA: Diagnosis not present

## 2019-12-23 LAB — COMPREHENSIVE METABOLIC PANEL
ALT: 14 U/L (ref 0–35)
AST: 16 U/L (ref 0–37)
Albumin: 4.3 g/dL (ref 3.5–5.2)
Alkaline Phosphatase: 87 U/L (ref 39–117)
BUN: 16 mg/dL (ref 6–23)
CO2: 30 mEq/L (ref 19–32)
Calcium: 9.4 mg/dL (ref 8.4–10.5)
Chloride: 105 mEq/L (ref 96–112)
Creatinine, Ser: 0.71 mg/dL (ref 0.40–1.20)
GFR: 81.58 mL/min (ref >60.00)
Glucose, Bld: 94 mg/dL (ref 70–99)
Potassium: 3.9 mEq/L (ref 3.5–5.1)
Sodium: 139 mEq/L (ref 135–145)
Total Bilirubin: 0.4 mg/dL (ref 0.2–1.2)
Total Protein: 7.3 g/dL (ref 6.0–8.3)

## 2019-12-23 LAB — CBC WITH DIFFERENTIAL/PLATELET
Basophils Absolute: 0.1 10*3/uL (ref 0.0–0.1)
Basophils Relative: 0.9 % (ref 0.0–3.0)
Eosinophils Absolute: 0.2 10*3/uL (ref 0.0–0.7)
Eosinophils Relative: 3 % (ref 0.0–5.0)
HCT: 37.9 % (ref 36.0–46.0)
Hemoglobin: 12.6 g/dL (ref 12.0–15.0)
Lymphocytes Relative: 35 % (ref 12.0–46.0)
Lymphs Abs: 2.4 10*3/uL (ref 0.7–4.0)
MCHC: 33.3 g/dL (ref 30.0–36.0)
MCV: 88.5 fl (ref 78.0–100.0)
Monocytes Absolute: 0.6 10*3/uL (ref 0.1–1.0)
Monocytes Relative: 9 % (ref 3.0–12.0)
Neutro Abs: 3.5 10*3/uL (ref 1.4–7.7)
Neutrophils Relative %: 52.1 % (ref 43.0–77.0)
Platelets: 453 10*3/uL — ABNORMAL HIGH (ref 150.0–400.0)
RBC: 4.29 Mil/uL (ref 3.87–5.11)
RDW: 13.3 % (ref 11.5–15.5)
WBC: 6.8 10*3/uL (ref 4.0–10.5)

## 2019-12-23 LAB — PROTIME-INR
INR: 1 ratio (ref 0.8–1.0)
Prothrombin Time: 10.8 s (ref 9.6–13.1)

## 2019-12-23 NOTE — Progress Notes (Signed)
[facility] Consult Note:  History: Shelley Perez 12/23/2019  Referring provider: Janith Lima, MD  Reason for consult/chief complaint: RUQ pain (abnormal CT scan)   Subjective  HPI: This is a very pleasant 69 year old woman referred by primary care for abnormal findings on CT abdomen and pelvis. She describes at least a year of some intermittent right-sided abdominal pain that does not seem to have any consistent or clear triggers or relieving factors.  It is mostly dull like a pressure, sometimes more upper and other times more in the right lower quadrant.  Sometimes it bothers her to lay on that side and she feels it might wake her from sleep at night.  She has chronic constipation for years and has been on Linzess, which she takes as needed.  If she takes it daily she gets loose stool with urgency. She was evaluated by primary care with this pain, part of that work-up included a urine test showing hematuria, she was then referred to urology.  CT scan was done with findings as noted below and then referred to [place]. Shelley Perez does not have rectal bleeding, her appetite is generally good and weight stable.  She denies dysphagia, odynophagia, nausea or vomiting.  She has not previously wanted a screening colonoscopy because an aunt apparently died from complications of that. Chronic constipation, on Linzess, no I or prior colonoscopy  Shelley Perez' urologist suggested that the abnormal findings on CT could have been related to previous cholecystectomy (perhaps also because this was suggested by the radiologist).  She brought several photos from her laparoscopic cholecystectomy in 2005.  There is some handwritten notes in the margins indicating certain structures or findings, but the writing is faded and I cannot read it.   She says that her surgeon came out to her in recovery and apologized profusely for something that had occurred during surgery, but she does not recall what it  was.  She stayed in the hospital several days, and does not recall needing any additional surgeries or procedures, and believes she has been well since then.  ROS:  Review of Systems  Constitutional: Negative for appetite change and unexpected weight change.  HENT: Negative for mouth sores and voice change.   Eyes: Negative for pain and redness.  Respiratory: Positive for cough. Negative for shortness of breath.   Cardiovascular: Negative for chest pain and palpitations.  Genitourinary: Negative for dysuria and hematuria.  Musculoskeletal: Negative for arthralgias and myalgias.       She has intermittent numbness and stiffness in both hands  Skin: Negative for pallor and rash.  Neurological: Negative for weakness and headaches.  Hematological: Negative for adenopathy.     Past Medical History: Past Medical History:  Diagnosis Date  . Anemia   . Anxiety   . Arthritis   . Asthma   . Cataract   . Complication of anesthesia    "they gave me too much and I had to stay 3 days " 2005 after choley   . COPD (chronic obstructive pulmonary disease) ([place])   . Depression   . Fibromyalgia   . GERD (gastroesophageal reflux disease)   . Hematuria   . History of kidney stones   . Hx of pancreatitis    with gallstones  . Hyperlipidemia   . Hypertension   . Insomnia   . Obesity   . Osteoporosis   . Overactive bladder   . Thrombocythemia ([place])   . Vitamin D deficiency    Sees Shelley Perez  for COPD  Past Surgical History: Past Surgical History:  Procedure Laterality Date  . c sections    . CHOLECYSTECTOMY    . DENTAL SURGERY    . FOOT SURGERY    . KNEE SURGERY  1978 / 1971  . SHOULDER SURGERY    . TOTAL KNEE ARTHROPLASTY Right 07/10/2015   Procedure: RIGHT TOTAL KNEE ARTHROPLASTY;  Surgeon: Paralee Cancel, MD;  Location: [facility];  Service: Orthopedics;  Laterality: Right;     Family History: Family History  Problem Relation Age of Onset  . Hyperlipidemia Mother   . Hypertension  Mother   . Heart attack Mother   . Heart disease Mother   . Cancer Father   . Asthma Sister   . Lung cancer Sister        smoked  . Colon cancer Neg Hx     Social History: Social History   Socioeconomic History  . Marital status: Widowed    Spouse name: Not on file  . Number of children: 2  . Years of education: Not on file  . Highest education level: Not on file  Occupational History  . Occupation: retired  Tobacco Use  . Smoking status: Former Smoker    Packs/day: 1.00    Years: 40.00    Pack years: 40.00    Types: Cigarettes    Quit date: 06/28/2013    Years since quitting: 6.4  . Smokeless tobacco: Never Used  Substance and Sexual Activity  . Alcohol use: No    Alcohol/week: 0.0 standard drinks  . Drug use: No  . Sexual activity: Never  Other Topics Concern  . Not on file  Social History Narrative  . Not on file   Social Determinants of Health   Financial Resource Strain:   . Difficulty of Paying Living Expenses:   Food Insecurity:   . Worried About Charity fundraiser in the Last Year:   . Arboriculturist in the Last Year:   Transportation Needs:   . Film/video editor (Medical):   Marland Kitchen Lack of Transportation (Non-Medical):   Physical Activity:   . Days of Exercise per Week:   . Minutes of Exercise per Session:   Stress:   . Feeling of Stress :   Social Connections:   . Frequency of Communication with Friends and Family:   . Frequency of Social Gatherings with Friends and Family:   . Attends Religious Services:   . Active Member of Clubs or Organizations:   . Attends Archivist Meetings:   Marland Kitchen Marital Status:     Allergies: Allergies  Allergen Reactions  . Rosuvastatin Calcium     Other reaction(s): Other Muscle and joint pain  . Atorvastatin     Muscle and joint pain  . Codeine Nausea Only and Other (See Comments)    Stomach pains  . Crestor [Rosuvastatin]     Muscle and joint pain  . Prozac [Fluoxetine Hcl] Other (See  Comments)    Changes her personality  . Latex Rash  . Sulfa Antibiotics Rash    Outpatient Meds: Current Outpatient Medications  Medication Sig Dispense Refill  . albuterol (PROVENTIL) (2.5 MG/3ML) 0.083% nebulizer solution Take 3 mLs (2.5 mg total) by nebulization every 6 (six) hours as needed for wheezing or shortness of breath. 75 mL 1  . amoxicillin (AMOXIL) 500 MG capsule Take 4 capsules by mouth 1 hour prior to appointment ( has a total knee)    . Ascorbic Acid (VITAMIN  C PO) Take 1,000 mg by mouth daily.     Marland Kitchen aspirin 81 MG tablet Take 81 mg by mouth daily.    . budesonide-formoterol (SYMBICORT) 80-4.5 MCG/ACT inhaler Take 2 puffs first thing in am and then another 2 puffs about 12 hours later. 1 Inhaler 11  . celecoxib (CELEBREX) 100 MG capsule Take 1 capsule (100 mg total) by mouth 2 (two) times daily. (Patient taking differently: Take 100 mg by mouth daily. ) 180 capsule 1  . Cholecalciferol (VITAMIN D) 50 MCG (2000 UT) CAPS Take 1 capsule by mouth daily.    Marland Kitchen dextromethorphan-guaiFENesin (MUCINEX DM) 30-600 MG 12hr tablet Take 1 tablet by mouth 2 (two) times daily as needed for cough.    . furosemide (LASIX) 20 MG tablet TAKE 1 TABLET BY MOUTH  TWICE A DAY (Patient taking differently: Take 20 mg by mouth daily. TAKE 1 TABLET BY MOUTH  TWICE A DAY) 180 tablet 1  . ipratropium-albuterol (DUONEB) 0.5-2.5 (3) MG/3ML SOLN Up to one every 4 hours as needed    . linaclotide (LINZESS) 145 MCG CAPS capsule Take 1 capsule (145 mcg total) by mouth daily as needed. 90 capsule 1  . mirabegron ER (MYRBETRIQ) 25 MG TB24 tablet Take 1 tablet (25 mg total) by mouth daily. 90 tablet 1  . Misc. Devices (ACAPELLA) MISC Use as needed.    . Multiple Vitamins-Minerals (ZINC PO) 50 mg one daily    . potassium chloride (KLOR-CON) 10 MEQ tablet Take 1 tablet (10 mEq total) by mouth daily. 90 tablet 1   No current facility-administered medications for this visit.       ___________________________________________________________________ Objective   Exam:  BP 120/80   Pulse (!) 58   Temp 98.4 F (36.9 C)   Ht 5\' 1"  (1.549 m)   Wt 194 lb (88 kg)   BMI 36.66 kg/m  Accompanied by her daughter and granddaughter.  General: Well-appearing, pleasant and conversational, somewhat hard of hearing.  Gets on exam table without difficulty.  Eyes: sclera anicteric, no redness  ENT: oral mucosa moist without lesions, no cervical or supraclavicular lymphadenopathy  CV: RRR without murmur, S1/S2, no JVD, no peripheral edema  Resp: clear to auscultation bilaterally, normal RR and effort noted  GI: soft, obese, no tenderness, with active bowel sounds. No guarding or palpable organomegaly noted, limited by body habitus.  Skin; warm and dry, no rash or jaundice noted  Neuro: awake, alert and oriented x 3. Normal gross motor function and fluent speech  Labs:  CBC Latest Ref Rng & Units 05/25/2019 02/22/2019 02/16/2018  WBC 4.0 - 10.5 K/uL 8.7 6.8 8.7  Hemoglobin 12.0 - 15.0 g/dL 12.7 12.7 12.1  Hematocrit 36.0 - 46.0 % 38.4 37.9 35.8(L)  Platelets 150.0 - 400.0 K/uL 505.0(H) 446.0(H) 445.0(H)   CMP Latest Ref Rng & Units 05/25/2019 02/22/2019 02/16/2018  Glucose 70 - 99 mg/dL 95 111(H) 102(H)  BUN 6 - 23 mg/dL 16 15 16   Creatinine 0.40 - 1.20 mg/dL 0.83 0.73 0.67  Sodium 135 - 145 mEq/L 139 141 140  Potassium 3.5 - 5.1 mEq/L 4.1 4.1 3.9  Chloride 96 - 112 mEq/L 104 104 105  CO2 19 - 32 mEq/L 25 29 29   Calcium 8.4 - 10.5 mg/dL 9.5 9.2 8.9  Total Protein 6.0 - 8.3 g/dL - 7.1 6.8  Total Bilirubin 0.2 - 1.2 mg/dL - 0.5 0.3  Alkaline Phos 39 - 117 U/L - 93 92  AST 0 - 37 U/L - 14 19  ALT 0 - 35 U/L - 13 21   Last INR was normal in November 2016  Radiologic Studies:  CT abdomen and pelvis with and without contrast on 11/02/2019 (ordered by urology for hematuria) has a report scanned and on file in the epic EMR.  Indicates stable biliary ductal  dilatation status post cholecystectomy, unchanged from previous exam.  No focal hepatic lesion.  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  This mass extends posteriorly to the pancreatic head and measures 2.3 x 1.8 cm, unchanged compared to the study of 01/17/2018). Pancreas reportedly normal. 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  Portosystemic collaterals in upper abdomen along with portal venous varix and signs of potential cavernous transformation as discussed.  Splenorenal shunting is demonstrated on the left and large collateral network bridging short gastrics and splenic vein with left renal vein.  Calcified atherosclerotic changes of the abdominal aorta and noncalcified plaque.  No adenopathy.  No pelvic adenopathy.  Other findings as noted in report.  Of note, it is reportedly a comparison to an MRI dated 01/17/2018 (no mention of this or any vascular abnormalities on the MRI report below).   ______________________________________  CLINICAL DATA:  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 Abdominal bloating. Weight gain. Prior cholecystectomy.   EXAM: MRI ABDOMEN WITHOUT AND WITH CONTRAST   TECHNIQUE: Multiplanar multisequence MR imaging of the abdomen was performed both before and after the administration of intravenous contrast.   Creatinine was obtained on site at [facility] at [address]   Results: Creatinine 0.7 mg/dL.   CONTRAST:  13mL MULTIHANCE GADOBENATE DIMEGLUMINE 529 MG/ML IV SOLN   COMPARISON:  12/31/2017 lumbar spine radiographs.   FINDINGS: Lower chest: Small fat containing left-sided Bochdalek's hernia. No acute abnormality at the lung bases.   Hepatobiliary: Normal liver size and configuration. No significant hepatic steatosis. No liver mass. Cholecystectomy. Bile ducts are within normal post  cholecystectomy limits, with common bile duct diameter 6 mm. No choledocholithiasis.   Pancreas: No pancreatic mass or duct dilation. No pancreas divisum. Pancreatic parenchymal signal intensity is within normal limits. No peripancreatic fluid collections.   Spleen: Normal size. No mass.   Adrenals/Urinary Tract: Normal adrenals. No hydronephrosis. Subcentimeter simple renal cyst in the medial lower right kidney. Otherwise normal kidneys, with no suspicious renal masses.   Stomach/Bowel: Small hiatal hernia. Otherwise normal nondistended stomach. Visualized small and large bowel is normal caliber, with no bowel wall thickening.   Vascular/Lymphatic: Atherosclerotic nonaneurysmal abdominal aorta. Patent portal, splenic, hepatic and renal veins. No pathologically enlarged lymph nodes in the abdomen.   Other: No abdominal ascites or focal fluid collection.   Musculoskeletal: No aggressive appearing focal osseous lesions.   IMPRESSION: 1. No acute abnormality. Normal pancreas, with no evidence of acute pancreatitis. Bile ducts are within normal post cholecystectomy limits. 2. Small hiatal hernia. 3.  Aortic Atherosclerosis (ICD10-I70.0).     Electronically Signed   By: Ilona Sorrel M.D.   On: 01/18/2018 08:22  (I am not able to personally review the images as they were done at an outside imaging center)  Assessment: Encounter Diagnoses  Name Primary?  . Abnormal finding on GI tract imaging Yes  . RLQ abdominal pain   . Chronic constipation   . 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  No current thrombus in the portal vein, unknown if that  or some other complication of cholecystectomy could have led to this.  How or if that could have occurred would be a better question for general surgeon, but is probably moot at this point.  The questions are whether or not this collateral vasculature extends into esophageal or  gastric cardia varices, and therefore whether or not anything can and should be done about this such as an interventional radiologic obliteration.  If there are varices, this would not be amenable to TIPS given the cavernous transformation of the portal vein, but perhaps B RTO.  The right-sided abdominal pain is of unclear cause, definitely unrelated to the collateral vasculature seen on the CT scan.  It is probably benign given lack of other CT scan findings in the bowel.  Possibly related to constipation, and I recommended she take low-dose MiraLAX every 1 to 2 days as well.  Plan:  CBC, CMP, PT/INR to ensure stability from last October. Upper endoscopy to screen for varices.  She is agreeable after discussion of procedure and risks.  The benefits and risks of the planned procedure were described in detail with the patient or (when appropriate) their health care proxy.  Risks were outlined as including, but not limited to, bleeding, infection, perforation, adverse medication reaction leading to cardiac or pulmonary decompensation, pancreatitis (if ERCP).  The limitation of incomplete mucosal visualization was also discussed.  No guarantees or warranties were given. Patient at increased risk for cardiopulmonary complications of procedure due to medical comorbidities.   She declines a screening colonoscopy.  Thank you for the courtesy of this consult.  Please call me with any questions or concerns.  Nelida Meuse III  CC: Referring provider noted above

## 2019-12-23 NOTE — Patient Instructions (Signed)
If you are age 69 or older, your body mass index should be between 23-30. Your Body mass index is 36.66 kg/m. If this is out of the aforementioned range listed, please consider follow up with your Primary Care Provider.  If you are age 28 or younger, your body mass index should be between 19-25. Your Body mass index is 36.66 kg/m. If this is out of the aformentioned range listed, please consider follow up with your Primary Care Provider.   You have been scheduled for an endoscopy. Please follow written instructions given to you at your visit today. If you use inhalers (even only as needed), please bring them with you on the day of your procedure.  Your provider has requested that you go to the basement level for lab work before leaving today. Press "B" on the elevator. The lab is located at the first door on the left as you exit the elevator.  Due to recent changes in healthcare laws, you may see the results of your imaging and laboratory studies on MyChart before your provider has had a chance to review them.  We understand that in some cases there may be results that are confusing or concerning to you. Not all laboratory results come back in the same time frame and the provider may be waiting for multiple results in order to interpret others.  Please give [place] 48 hours in order for your provider to thoroughly review all the results before contacting the office for clarification of your results.    It was a pleasure to see you today!  Dr. Loletha Carrow

## 2019-12-26 ENCOUNTER — Encounter: Payer: Self-pay | Admitting: Gastroenterology

## 2019-12-29 ENCOUNTER — Ambulatory Visit (INDEPENDENT_AMBULATORY_CARE_PROVIDER_SITE_OTHER): Payer: Medicare Other

## 2019-12-29 ENCOUNTER — Other Ambulatory Visit: Payer: Self-pay | Admitting: Gastroenterology

## 2019-12-29 DIAGNOSIS — Z1159 Encounter for screening for other viral diseases: Secondary | ICD-10-CM | POA: Diagnosis not present

## 2019-12-30 LAB — SARS CORONAVIRUS 2 (TAT 6-24 HRS): SARS Coronavirus 2: NEGATIVE

## 2020-01-03 ENCOUNTER — Other Ambulatory Visit: Payer: Self-pay

## 2020-01-03 ENCOUNTER — Encounter: Payer: Self-pay | Admitting: Gastroenterology

## 2020-01-03 ENCOUNTER — Ambulatory Visit (AMBULATORY_SURGERY_CENTER): Payer: Medicare Other | Admitting: Gastroenterology

## 2020-01-03 VITALS — BP 162/72 | HR 68 | Temp 97.7°F | Resp 16 | Ht 61.0 in | Wt 194.0 lb

## 2020-01-03 DIAGNOSIS — K766 Portal hypertension: Secondary | ICD-10-CM | POA: Diagnosis not present

## 2020-01-03 DIAGNOSIS — R933 Abnormal findings on diagnostic imaging of other parts of digestive tract: Secondary | ICD-10-CM | POA: Diagnosis not present

## 2020-01-03 MED ORDER — SODIUM CHLORIDE 0.9 % IV SOLN: 500.0000 mL | Freq: Once | INTRAVENOUS | Status: DC

## 2020-01-03 NOTE — Op Note (Signed)
[facility] Patient Name: Shelley Perez Procedure Date: 01/03/2020 2:12 PM MRN: [id number] Endoscopist: Mallie Mussel L. 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 relation to complication of                            distant cholecystectomy such as portal vein injury                            or thrombus) This exam done to exclude esophageal                            or gastric varices requiring intervention. Medicines:                Monitored Anesthesia Care Procedure:                Pre-Anesthesia Assessment:                           - Prior to the procedure, a History and Physical                            was performed, and patient medications and                            allergies were reviewed. The patient's tolerance of                            previous anesthesia was also reviewed. The risks                            and benefits of the procedure and the sedation                            options and risks were discussed with the patient.                            All questions were answered, and informed consent                            was obtained. Prior Anticoagulants: The patient has  taken no previous anticoagulant or antiplatelet                            agents. ASA Grade Assessment: II - A patient with                            mild systemic disease. After reviewing the risks                            and benefits, the patient was deemed in                            satisfactory condition to undergo the procedure.                           After  obtaining informed consent, the endoscope was                            passed under direct vision. Throughout the                            procedure, the patient's blood pressure, pulse, and                            oxygen saturations were monitored continuously. The                            Endoscope was introduced through the mouth, and                            advanced to the second part of duodenum. The upper                            GI endoscopy was accomplished without difficulty.                            The patient tolerated the procedure well. Scope In: Scope Out: Findings:                 The esophagus was normal. No varices.                           The stomach was normal.                           The cardia and gastric fundus were normal on                            retroflexion. No varices.                           The examined duodenum was normal. Complications:            No immediate complications. Estimated Blood Loss:     Estimated blood loss: none. Impression:               -  Normal esophagus.                           - Normal stomach.                           - Normal examined duodenum.                           - No specimens collected. Recommendation:           - Patient has a contact number available for                            emergencies. The signs and symptoms of potential                            delayed complications were discussed with the                            patient. Return to normal activities tomorrow.                            Written discharge instructions were provided to the                            patient.                           - Resume previous diet.                           - Continue present medications. Emric Kowalewski L. Loletha Carrow, MD 01/03/2020 2:33:08 PM This report has been signed electronically.

## 2020-01-03 NOTE — Progress Notes (Signed)
Pt's states no medical or surgical changes since previsit or office visit. 

## 2020-01-03 NOTE — Patient Instructions (Signed)
YOU HAD AN ENDOSCOPIC PROCEDURE TODAY AT [facility]:   Refer to the procedure report that was given to you for any specific questions about what was found during the examination.  If the procedure report does not answer your questions, please call your gastroenterologist to clarify.  If you requested that your care partner not be given the details of your procedure findings, then the procedure report has been included in a sealed envelope for you to review at your convenience later.  YOU SHOULD EXPECT: Some feelings of bloating in the abdomen. Passage of more gas than usual.  Walking can help get rid of the air that was put into your GI tract during the procedure and reduce the bloating.  Please Note:  You might notice some irritation and congestion in your nose or some drainage.  This is from the oxygen used during your procedure.  There is no need for concern and it should clear up in a day or so.  SYMPTOMS TO REPORT IMMEDIATELY:  Following upper endoscopy (EGD)  Vomiting of blood or coffee ground material  New chest pain or pain under the shoulder blades  Painful or persistently difficult swallowing  New shortness of breath  Fever of 100F or higher  Black, tarry-looking stools  For urgent or emergent issues, a gastroenterologist can be reached at any hour by calling [phone]. Do not use MyChart messaging for urgent concerns.    DIET:  We do recommend a small meal at first, but then you may proceed to your regular diet.  Drink plenty of fluids but you should avoid alcoholic beverages for 24 hours.  ACTIVITY:  You should plan to take it easy for the rest of today and you should NOT DRIVE or use heavy machinery until tomorrow (because of the sedation medicines used during the test).    FOLLOW UP: Our staff will call the number listed on your records 48-72 hours following your procedure to check on you and address any questions or concerns that you may have regarding  the information given to you following your procedure. If we do not reach you, we will leave a message.  We will attempt to reach you two times.  During this call, we will ask if you have developed any symptoms of COVID 19. If you develop any symptoms (ie: fever, flu-like symptoms, shortness of breath, cough etc.) before then, please call [phone].  If you test positive for Covid 19 in the 2 weeks post procedure, please call and report this information to [place].   SIGNATURES/CONFIDENTIALITY: You and/or your care partner have signed paperwork which will be entered into your electronic medical record.  These signatures attest to the fact that that the information above on your After Visit Summary has been reviewed and is understood.  Full responsibility of the confidentiality of this discharge information lies with you and/or your care-partner.  Please continue your normal medications

## 2020-01-03 NOTE — Progress Notes (Signed)
A/ox3, pleased with MAC, report to RN 

## 2020-01-05 ENCOUNTER — Telehealth: Payer: Self-pay

## 2020-01-05 NOTE — Telephone Encounter (Signed)
1st follow up call made.  NAULM 

## 2020-01-05 NOTE — Telephone Encounter (Signed)
2nd follow up call made.  NAULM 

## 2020-02-15 ENCOUNTER — Ambulatory Visit (INDEPENDENT_AMBULATORY_CARE_PROVIDER_SITE_OTHER): Payer: Medicare Other | Admitting: Internal Medicine

## 2020-02-15 ENCOUNTER — Other Ambulatory Visit: Payer: Self-pay

## 2020-02-15 ENCOUNTER — Encounter: Payer: Self-pay | Admitting: Internal Medicine

## 2020-02-15 VITALS — BP 172/76 | HR 63 | Temp 98.5°F | Resp 16 | Ht 61.0 in | Wt 191.0 lb

## 2020-02-15 DIAGNOSIS — E559 Vitamin D deficiency, unspecified: Secondary | ICD-10-CM

## 2020-02-15 DIAGNOSIS — I10 Essential (primary) hypertension: Secondary | ICD-10-CM | POA: Diagnosis not present

## 2020-02-15 DIAGNOSIS — H6121 Impacted cerumen, right ear: Secondary | ICD-10-CM

## 2020-02-15 DIAGNOSIS — H9193 Unspecified hearing loss, bilateral: Secondary | ICD-10-CM | POA: Insufficient documentation

## 2020-02-15 DIAGNOSIS — D473 Essential (hemorrhagic) thrombocythemia: Secondary | ICD-10-CM

## 2020-02-15 DIAGNOSIS — Z1211 Encounter for screening for malignant neoplasm of colon: Secondary | ICD-10-CM

## 2020-02-15 DIAGNOSIS — I700 Atherosclerosis of aorta: Secondary | ICD-10-CM | POA: Diagnosis not present

## 2020-02-15 DIAGNOSIS — E785 Hyperlipidemia, unspecified: Secondary | ICD-10-CM | POA: Diagnosis not present

## 2020-02-15 DIAGNOSIS — D75839 Thrombocytosis, unspecified: Secondary | ICD-10-CM

## 2020-02-15 DIAGNOSIS — Z1231 Encounter for screening mammogram for malignant neoplasm of breast: Secondary | ICD-10-CM | POA: Insufficient documentation

## 2020-02-15 DIAGNOSIS — N3281 Overactive bladder: Secondary | ICD-10-CM | POA: Diagnosis not present

## 2020-02-15 LAB — CBC WITH DIFFERENTIAL/PLATELET
Basophils Absolute: 0.1 10*3/uL (ref 0.0–0.1)
Basophils Relative: 0.9 % (ref 0.0–3.0)
Eosinophils Absolute: 0.3 10*3/uL (ref 0.0–0.7)
Eosinophils Relative: 4.2 % (ref 0.0–5.0)
HCT: 36.3 % (ref 36.0–46.0)
Hemoglobin: 12.3 g/dL (ref 12.0–15.0)
Lymphocytes Relative: 36.9 % (ref 12.0–46.0)
Lymphs Abs: 2.7 10*3/uL (ref 0.7–4.0)
MCHC: 33.9 g/dL (ref 30.0–36.0)
MCV: 87.7 fl (ref 78.0–100.0)
Monocytes Absolute: 0.8 10*3/uL (ref 0.1–1.0)
Monocytes Relative: 10.8 % (ref 3.0–12.0)
Neutro Abs: 3.5 10*3/uL (ref 1.4–7.7)
Neutrophils Relative %: 47.2 % (ref 43.0–77.0)
Platelets: 461 10*3/uL — ABNORMAL HIGH (ref 150.0–400.0)
RBC: 4.14 Mil/uL (ref 3.87–5.11)
RDW: 13.1 % (ref 11.5–15.5)
WBC: 7.4 10*3/uL (ref 4.0–10.5)

## 2020-02-15 LAB — LIPID PANEL
Cholesterol: 214 mg/dL — ABNORMAL HIGH (ref 0–200)
HDL: 57.4 mg/dL (ref >39.00)
LDL Cholesterol: 126 mg/dL — ABNORMAL HIGH (ref 0–99)
NonHDL: 156.9
Total CHOL/HDL Ratio: 4
Triglycerides: 153 mg/dL — ABNORMAL HIGH (ref 0.0–149.0)
VLDL: 30.6 mg/dL (ref 0.0–40.0)

## 2020-02-15 LAB — IBC PANEL
Iron: 98 ug/dL (ref 42–145)
Saturation Ratios: 29 % (ref 20.0–50.0)
Transferrin: 241 mg/dL (ref 212.0–360.0)

## 2020-02-15 LAB — URINALYSIS, ROUTINE W REFLEX MICROSCOPIC
Bilirubin Urine: NEGATIVE
Ketones, ur: NEGATIVE
Leukocytes,Ua: NEGATIVE
Nitrite: NEGATIVE
Specific Gravity, Urine: <=1.005 — AB (ref 1.000–1.030)
Total Protein, Urine: NEGATIVE
Urine Glucose: NEGATIVE
Urobilinogen, UA: 0.2 (ref 0.0–1.0)
pH: 6 (ref 5.0–8.0)

## 2020-02-15 LAB — VITAMIN D 25 HYDROXY (VIT D DEFICIENCY, FRACTURES): VITD: 37.8 ng/mL (ref 30.00–100.00)

## 2020-02-15 LAB — BASIC METABOLIC PANEL
BUN: 15 mg/dL (ref 6–23)
CO2: 27 mEq/L (ref 19–32)
Calcium: 9.4 mg/dL (ref 8.4–10.5)
Chloride: 104 mEq/L (ref 96–112)
Creatinine, Ser: 0.72 mg/dL (ref 0.40–1.20)
GFR: 80.23 mL/min (ref >60.00)
Glucose, Bld: 85 mg/dL (ref 70–99)
Potassium: 4 mEq/L (ref 3.5–5.1)
Sodium: 138 mEq/L (ref 135–145)

## 2020-02-15 LAB — FERRITIN: Ferritin: 76.8 ng/mL (ref 10.0–291.0)

## 2020-02-15 MED ORDER — EDARBI 40 MG PO TABS: 1.0000 | ORAL_TABLET | Freq: Every day | ORAL | 1 refills | Status: DC

## 2020-02-15 MED ORDER — INDAPAMIDE 1.25 MG PO TABS: 1.2500 mg | ORAL_TABLET | Freq: Every day | ORAL | 0 refills | Status: DC

## 2020-02-15 MED ORDER — ASPIRIN EC 81 MG PO TBEC: 81.0000 mg | DELAYED_RELEASE_TABLET | Freq: Every day | ORAL | 1 refills | Status: DC

## 2020-02-15 NOTE — Patient Instructions (Signed)

## 2020-02-15 NOTE — Progress Notes (Signed)
Patient consent obtained. Irrigation with water and peroxide performed. Full view of right tympanic membranes after procedure.  Patient tolerated procedure well.   

## 2020-02-15 NOTE — Progress Notes (Signed)
Subjective:  Patient ID: Shelley Perez, female    DOB: [birth year]  Age: 69 y.o. MRN: [id number]  CC: Hypertension  This visit occurred during the SARS-CoV-2 public health emergency.  Safety protocols were in place, including screening questions prior to the visit, additional usage of staff PPE, and extensive cleaning of exam room while observing appropriate contact time as indicated for disinfecting solutions.    HPI Shelley Perez presents for f/up - She complains of a several week history of decreased level of hearing, worse on the right than the left.  She denies pain, dizziness, vertigo, or lightheadedness.  She continues to complain of lower extremity edema.  She tells me she is not getting much symptom relief with the loop diuretic.  She does not monitor her blood pressure.  She is active and denies any recent episodes of headache, blurred vision, chest pain, shortness of breath, palpitations, or fatigue.  Outpatient Medications Prior to Visit  Medication Sig Dispense Refill  . albuterol (PROVENTIL) (2.5 MG/3ML) 0.083% nebulizer solution Take 3 mLs (2.5 mg total) by nebulization every 6 (six) hours as needed for wheezing or shortness of breath. 75 mL 1  . Ascorbic Acid (VITAMIN C PO) Take 1,000 mg by mouth daily.     . budesonide-formoterol (SYMBICORT) 80-4.5 MCG/ACT inhaler Take 2 puffs first thing in am and then another 2 puffs about 12 hours later. 1 Inhaler 11  . celecoxib (CELEBREX) 100 MG capsule Take 1 capsule (100 mg total) by mouth 2 (two) times daily. (Patient taking differently: Take 100 mg by mouth daily. ) 180 capsule 1  . Cholecalciferol (VITAMIN D) 50 MCG (2000 UT) CAPS Take 1 capsule by mouth daily.    Marland Kitchen dextromethorphan-guaiFENesin (MUCINEX DM) 30-600 MG 12hr tablet Take 1 tablet by mouth 2 (two) times daily as needed for cough.    Marland Kitchen ipratropium-albuterol (DUONEB) 0.5-2.5 (3) MG/3ML SOLN Up to one every 4 hours as needed    . linaclotide (LINZESS) 145 MCG CAPS capsule  Take 1 capsule (145 mcg total) by mouth daily as needed. 90 capsule 1  . Misc. Devices (ACAPELLA) MISC Use as needed.    . Multiple Vitamins-Minerals (ZINC PO) 50 mg one daily    . potassium chloride (KLOR-CON) 10 MEQ tablet Take 1 tablet (10 mEq total) by mouth daily. 90 tablet 1  . aspirin 81 MG tablet Take 81 mg by mouth daily.    . furosemide (LASIX) 20 MG tablet TAKE 1 TABLET BY MOUTH  TWICE A DAY (Patient taking differently: Take 20 mg by mouth daily. TAKE 1 TABLET BY MOUTH  TWICE A DAY) 180 tablet 1  . mirabegron ER (MYRBETRIQ) 25 MG TB24 tablet Take 1 tablet (25 mg total) by mouth daily. 90 tablet 1  . amoxicillin (AMOXIL) 500 MG capsule Take 4 capsules by mouth 1 hour prior to appointment ( has a total knee) (Patient not taking: Reported on 02/15/2020)     No facility-administered medications prior to visit.    ROS Review of Systems  Constitutional: Negative for appetite change, diaphoresis, fatigue and unexpected weight change.  HENT: Negative.   Eyes: Negative.   Respiratory: Negative for cough, chest tightness, shortness of breath and wheezing.   Cardiovascular: Positive for leg swelling. Negative for chest pain and palpitations.  Gastrointestinal: Negative for abdominal pain, constipation, diarrhea, nausea and vomiting.  Endocrine: Positive for polyuria.  Genitourinary: Positive for frequency and urgency. Negative for difficulty urinating, dysuria and hematuria.  Musculoskeletal: Negative.  Negative for arthralgias, back pain  and myalgias.  Neurological: Negative.  Negative for dizziness, weakness and light-headedness.  Hematological: Negative for adenopathy. Does not bruise/bleed easily.  Psychiatric/Behavioral: Negative.     Objective:  BP (!) 172/76 (BP Location: Left Arm, Patient Position: Sitting, Cuff Size: Large)   Pulse 63   Temp 98.5 F (36.9 C) (Oral)   Resp 16   Ht 5\' 1"  (1.549 m)   Wt 191 lb (86.6 kg)   SpO2 97%   BMI 36.09 kg/m   BP Readings from Last  3 Encounters:  02/15/20 (!) 172/76  01/03/20 (!) 162/72  12/23/19 120/80    Wt Readings from Last 3 Encounters:  02/15/20 191 lb (86.6 kg)  01/03/20 194 lb (88 kg)  12/23/19 194 lb (88 kg)    Physical Exam Vitals reviewed.  Constitutional:      Appearance: She is obese.  HENT:     Right Ear: Decreased hearing noted. No middle ear effusion. There is impacted cerumen. No mastoid tenderness. Tympanic membrane is not injected.     Left Ear: Hearing, tympanic membrane and ear canal normal. No decreased hearing noted.     Ears:     Comments: I put Colace in her right EAC and then I irrigated it with water and used an ear pick to remove the cerumen.  Her hearing has now returned to normal.  She tolerated the procedure well with no complications.    Nose: Nose normal.     Mouth/Throat:     Mouth: Mucous membranes are moist.  Eyes:     General: No scleral icterus.    Conjunctiva/sclera: Conjunctivae normal.  Cardiovascular:     Rate and Rhythm: Normal rate and regular rhythm.     Heart sounds: No murmur heard.      Comments: EKG - Sinus rhythm with sinus arrhythmia and one PVC. Q-wave in III is not new. Otherwise unremarkable EKG. Pulmonary:     Effort: Pulmonary effort is normal.     Breath sounds: No stridor. No wheezing or rhonchi.  Abdominal:     General: Abdomen is protuberant. Bowel sounds are normal. There is no distension.     Palpations: Abdomen is soft. There is no hepatomegaly, splenomegaly or mass.     Tenderness: There is no abdominal tenderness.  Musculoskeletal:        General: Normal range of motion.     Cervical back: Neck supple.  Lymphadenopathy:     Cervical: No cervical adenopathy.  Skin:    General: Skin is warm and dry.  Neurological:     General: No focal deficit present.     Mental Status: She is alert.  Psychiatric:        Mood and Affect: Mood normal.        Behavior: Behavior normal.     Lab Results  Component Value Date   WBC 7.4  02/15/2020   HGB 12.3 02/15/2020   HCT 36.3 02/15/2020   PLT 461.0 (H) 02/15/2020   GLUCOSE 85 02/15/2020   CHOL 214 (H) 02/15/2020   TRIG 153.0 (H) 02/15/2020   HDL 57.40 02/15/2020   LDLDIRECT 120.0 02/16/2018   LDLCALC 126 (H) 02/15/2020   ALT 14 12/23/2019   AST 16 12/23/2019   NA 138 02/15/2020   K 4.0 02/15/2020   CL 104 02/15/2020   CREATININE 0.72 02/15/2020   BUN 15 02/15/2020   CO2 27 02/15/2020   TSH 2.41 02/22/2019   INR 1.0 12/23/2019   HGBA1C 5.6 02/22/2019  MM DIAG BREAST TOMO UNI RIGHT  Result Date: 05/13/2018 CLINICAL DATA:  69 year old female with focal right axillary pain approximately 2 months ago, improved today but still tender. EXAM: DIGITAL DIAGNOSTIC RIGHT MAMMOGRAM WITH CAD AND TOMO ULTRASOUND RIGHT BREAST COMPARISON:  Previous exam(s). ACR Breast Density Category b: There are scattered areas of fibroglandular density. FINDINGS: A radiopaque BB was placed at the site of the patient's clinical symptoms in the right axilla. No suspicious mammographic findings are seen deep to the radiopaque BB or within the remainder of the right breast. Mammographic images were processed with CAD. Targeted ultrasound is performed, showing normal tissue without focal or suspicious sonographic finding. Evaluation of the right axilla was performed. Note is made of morphologically normal lymph nodes. IMPRESSION: No suspicious mammographic or sonographic findings corresponding to the patient's clinical symptoms. Recommendation is for clinical and symptomatic follow-up. RECOMMENDATION: 1. Clinical follow-up recommended for the painful area of concern in the right breast. Any further workup should be based on clinical grounds. 2. Patient is due for annual bilateral screening in March 2020. I have discussed the findings and recommendations with the patient. Results were also provided in writing at the conclusion of the visit. If applicable, a reminder letter will be sent to the patient  regarding the next appointment. BI-RADS CATEGORY  1: Negative. Electronically Signed   By: Kristopher Oppenheim M.D.   On: 05/13/2018 15:48   [place] AXILLA RIGHT  Result Date: 05/13/2018 CLINICAL DATA:  69 year old female with focal right axillary pain approximately 2 months ago, improved today but still tender. EXAM: DIGITAL DIAGNOSTIC RIGHT MAMMOGRAM WITH CAD AND TOMO ULTRASOUND RIGHT BREAST COMPARISON:  Previous exam(s). ACR Breast Density Category b: There are scattered areas of fibroglandular density. FINDINGS: A radiopaque BB was placed at the site of the patient's clinical symptoms in the right axilla. No suspicious mammographic findings are seen deep to the radiopaque BB or within the remainder of the right breast. Mammographic images were processed with CAD. Targeted ultrasound is performed, showing normal tissue without focal or suspicious sonographic finding. Evaluation of the right axilla was performed. Note is made of morphologically normal lymph nodes. IMPRESSION: No suspicious mammographic or sonographic findings corresponding to the patient's clinical symptoms. Recommendation is for clinical and symptomatic follow-up. RECOMMENDATION: 1. Clinical follow-up recommended for the painful area of concern in the right breast. Any further workup should be based on clinical grounds. 2. Patient is due for annual bilateral screening in March 2020. I have discussed the findings and recommendations with the patient. Results were also provided in writing at the conclusion of the visit. If applicable, a reminder letter will be sent to the patient regarding the next appointment. BI-RADS CATEGORY  1: Negative. Electronically Signed   By: Kristopher Oppenheim M.D.   On: 05/13/2018 15:48    Assessment & Plan:   Shelley Perez was seen today for hypertension.  Diagnoses and all orders for this visit:  Screen for colon cancer -     Cologuard  Essential hypertension- Her blood pressure is not adequately well controlled.  I  recommended that she change to an ARB and thiazide diuretic.  Labs are negative for secondary causes or endorgan damage. -     CBC with Differential/Platelet; Future -     Urinalysis, Routine w reflex microscopic; Future -     Basic metabolic panel; Future -     Discontinue: Azilsartan Medoxomil (EDARBI) 40 MG TABS; Take 1 tablet by mouth daily. -     EKG 12-Lead -  indapamide (LOZOL) 1.25 MG tablet; Take 1 tablet (1.25 mg total) by mouth daily. -     Basic metabolic panel -     Urinalysis, Routine w reflex microscopic -     CBC with Differential/Platelet -     irbesartan (AVAPRO) 150 MG tablet; Take 1 tablet (150 mg total) by mouth daily.  Atherosclerosis of aorta ([place])- Will continue to work on risk factor modifications.  She is not willing to take a statin. -     aspirin EC 81 MG tablet; Take 1 tablet (81 mg total) by mouth daily.  OAB (overactive bladder)- Will continue Myrbetriq. -     Urinalysis, Routine w reflex microscopic; Future -     Urinalysis, Routine w reflex microscopic  Thrombocytosis ([place])- Her platelet count remains mildly elevated.  She does not complain of bleeding or bruising.  Her iron levels normal.  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  Will continue to monitor. -     CBC with Differential/Platelet; Future -     IBC panel; Future -     Ferritin; Future -     Ferritin -     IBC panel -     CBC with Differential/Platelet  Hyperlipidemia LDL goal <130- She is not willing to take a statin. -     Lipid panel; Future -     Lipid panel  Vitamin D deficiency- Her Vit D level is normal now. -     VITAMIN D 25 Hydroxy (Vit-D Deficiency, Fractures); Future -     VITAMIN D 25 Hydroxy (Vit-D Deficiency, Fractures)  Visit for screening mammogram -     MM DIGITAL SCREENING BILATERAL; Future  Bilateral hearing loss, unspecified hearing loss type -     Ambulatory referral to Audiology  Hearing loss secondary to cerumen impaction, right- Her hearing is normal now  that the cerumen has been removed.   I have discontinued Shelley Perez's aspirin, furosemide, and Edarbi. I am also having her start on indapamide, aspirin EC, and irbesartan. Additionally, I am having her maintain her ipratropium-albuterol, Acapella, albuterol, linaclotide, celecoxib, dextromethorphan-guaiFENesin, Ascorbic Acid (VITAMIN C PO), budesonide-formoterol, potassium chloride, amoxicillin, Multiple Vitamins-Minerals (ZINC PO), Vitamin D, and mirabegron ER.  Meds ordered this encounter  Medications  . DISCONTD: Azilsartan Medoxomil (EDARBI) 40 MG TABS    Sig: Take 1 tablet by mouth daily.    Dispense:  90 tablet    Refill:  1  . indapamide (LOZOL) 1.25 MG tablet    Sig: Take 1 tablet (1.25 mg total) by mouth daily.    Dispense:  90 tablet    Refill:  0  . aspirin EC 81 MG tablet    Sig: Take 1 tablet (81 mg total) by mouth daily.    Dispense:  90 tablet    Refill:  1  . irbesartan (AVAPRO) 150 MG tablet    Sig: Take 1 tablet (150 mg total) by mouth daily.    Dispense:  90 tablet    Refill:  1  . mirabegron ER (MYRBETRIQ) 25 MG TB24 tablet    Sig: Take 1 tablet (25 mg total) by mouth daily.    Dispense:  90 tablet    Refill:  1   I spent 60 minutes in preparing to see the patient by review of recent labs, imaging and procedures, obtaining and reviewing separately obtained history, communicating with the patient and family or caregiver, ordering medications, tests or procedures, and documenting clinical information in the EHR including the differential Dx,  treatment, and any further evaluation and other management of1. Essential hypertension 2. Atherosclerosis of aorta ([place]) 3. OAB (overactive bladder) 4. Thrombocytosis ([place]) 5. Hyperlipidemia LDL goal <130 6. Vitamin D deficiency 7. Bilateral hearing loss, unspecified hearing loss type 8. Hearing loss secondary to cerumen impaction, right     Follow-up: Return in about 3 months (around 05/17/2020).  Scarlette Calico,  MD

## 2020-02-16 ENCOUNTER — Telehealth: Payer: Self-pay | Admitting: Internal Medicine

## 2020-02-16 DIAGNOSIS — S0502XA Injury of conjunctiva and corneal abrasion without foreign body, left eye, initial encounter: Secondary | ICD-10-CM | POA: Diagnosis not present

## 2020-02-16 MED ORDER — MIRABEGRON ER 25 MG PO TB24: 25.0000 mg | ORAL_TABLET | Freq: Every day | ORAL | 1 refills | Status: DC

## 2020-02-16 MED ORDER — IRBESARTAN 150 MG PO TABS: 150.0000 mg | ORAL_TABLET | Freq: Every day | ORAL | 1 refills | Status: DC

## 2020-02-16 NOTE — Telephone Encounter (Signed)
    Pt c/o medication issue:  1. Name of Medication: indapamide (LOZOL) 1.25 MG tablet  2. How are you currently taking this medication (dosage and times per day)? As written  3. Are you having a reaction (difficulty breathing--STAT)? no  4. What is your medication issue? Patient wants to confirm she can take indapamide (LOZOL) 1.25 MG tablet, she has a a sulfur allergy

## 2020-02-16 NOTE — Telephone Encounter (Signed)
New Message:   Pt states this medication Azilsartan Medoxomil (EDARBI) 40 MG TABS is $350 and she states they said there is no generic and she is unable to pay for this medication. Please advise.

## 2020-02-17 DIAGNOSIS — S0502XD Injury of conjunctiva and corneal abrasion without foreign body, left eye, subsequent encounter: Secondary | ICD-10-CM | POA: Diagnosis not present

## 2020-02-17 DIAGNOSIS — H16213 Exposure keratoconjunctivitis, bilateral: Secondary | ICD-10-CM | POA: Diagnosis not present

## 2020-02-17 NOTE — Telephone Encounter (Signed)
LVM for pt to call back as soon as possible.   

## 2020-02-17 NOTE — Telephone Encounter (Signed)
F/u   Pt c/o medication issue:  1. Name of Medication: irbesartan (AVAPRO) 150 MG tablet   2. How are you currently taking this medication (dosage and times per day)? daily  3. Are you having a reaction (difficulty breathing--STAT)? No   4. What is your medication issue? Patient take potassium pill  Please advise

## 2020-02-21 ENCOUNTER — Other Ambulatory Visit: Payer: Self-pay

## 2020-02-21 ENCOUNTER — Encounter: Payer: Self-pay | Admitting: Internal Medicine

## 2020-02-21 ENCOUNTER — Ambulatory Visit (INDEPENDENT_AMBULATORY_CARE_PROVIDER_SITE_OTHER): Payer: Medicare Other | Admitting: Internal Medicine

## 2020-02-21 VITALS — BP 136/72 | HR 62 | Temp 97.8°F | Resp 16 | Ht 61.0 in | Wt 186.2 lb

## 2020-02-21 DIAGNOSIS — Z789 Other specified health status: Secondary | ICD-10-CM

## 2020-02-21 DIAGNOSIS — E785 Hyperlipidemia, unspecified: Secondary | ICD-10-CM | POA: Diagnosis not present

## 2020-02-21 DIAGNOSIS — I10 Essential (primary) hypertension: Secondary | ICD-10-CM | POA: Diagnosis not present

## 2020-02-21 LAB — BASIC METABOLIC PANEL
BUN: 20 mg/dL (ref 6–23)
CO2: 28 mEq/L (ref 19–32)
Calcium: 9.5 mg/dL (ref 8.4–10.5)
Chloride: 103 mEq/L (ref 96–112)
Creatinine, Ser: 0.76 mg/dL (ref 0.40–1.20)
GFR: 75.38 mL/min (ref >60.00)
Glucose, Bld: 95 mg/dL (ref 70–99)
Potassium: 4 mEq/L (ref 3.5–5.1)
Sodium: 139 mEq/L (ref 135–145)

## 2020-02-21 NOTE — Patient Instructions (Signed)

## 2020-02-21 NOTE — Progress Notes (Signed)
Subjective:  Patient ID: Shelley Perez, female    DOB: 12-16-1950  Age: 69 y.o. MRN: [id number]  CC: Hypertension  This visit occurred during the SARS-CoV-2 public health emergency.  Safety protocols were in place, including screening questions prior to the visit, additional usage of staff PPE, and extensive cleaning of exam room while observing appropriate contact time as indicated for disinfecting solutions.    HPI Shelley Perez presents for f/up - She tells me her blood pressure has been well controlled since she started taking the diuretic and ARB.  She is concerned that it may be too much medicine though because she has had some fatigue late in the day.  She wants to keep taking the diuretic because it has helped remove some of the fluid from her lower extremities.  She denies dizziness, lightheadedness, headache, chest pain, or shortness of breath.  Outpatient Medications Prior to Visit  Medication Sig Dispense Refill  . albuterol (PROVENTIL) (2.5 MG/3ML) 0.083% nebulizer solution Take 3 mLs (2.5 mg total) by nebulization every 6 (six) hours as needed for wheezing or shortness of breath. 75 mL 1  . Ascorbic Acid (VITAMIN C PO) Take 1,000 mg by mouth daily.     Marland Kitchen aspirin EC 81 MG tablet Take 1 tablet (81 mg total) by mouth daily. 90 tablet 1  . budesonide-formoterol (SYMBICORT) 80-4.5 MCG/ACT inhaler Take 2 puffs first thing in am and then another 2 puffs about 12 hours later. 1 Inhaler 11  . celecoxib (CELEBREX) 100 MG capsule Take 1 capsule (100 mg total) by mouth 2 (two) times daily. (Patient taking differently: Take 100 mg by mouth daily. ) 180 capsule 1  . Cholecalciferol (VITAMIN D) 50 MCG (2000 UT) CAPS Take 1 capsule by mouth daily.    Marland Kitchen dextromethorphan-guaiFENesin (MUCINEX DM) 30-600 MG 12hr tablet Take 1 tablet by mouth 2 (two) times daily as needed for cough.    . indapamide (LOZOL) 1.25 MG tablet Take 1 tablet (1.25 mg total) by mouth daily. 90 tablet 0  .  ipratropium-albuterol (DUONEB) 0.5-2.5 (3) MG/3ML SOLN Up to one every 4 hours as needed    . linaclotide (LINZESS) 145 MCG CAPS capsule Take 1 capsule (145 mcg total) by mouth daily as needed. 90 capsule 1  . mirabegron ER (MYRBETRIQ) 25 MG TB24 tablet Take 1 tablet (25 mg total) by mouth daily. 90 tablet 1  . Misc. Devices (ACAPELLA) MISC Use as needed.    . Multiple Vitamins-Minerals (ZINC PO) 50 mg one daily    . potassium chloride (KLOR-CON) 10 MEQ tablet Take 1 tablet (10 mEq total) by mouth daily. 90 tablet 1  . irbesartan (AVAPRO) 150 MG tablet Take 1 tablet (150 mg total) by mouth daily. 90 tablet 1  . amoxicillin (AMOXIL) 500 MG capsule Take 4 capsules by mouth 1 hour prior to appointment ( has a total knee) (Patient not taking: Reported on 02/21/2020)     No facility-administered medications prior to visit.    ROS Review of Systems  Constitutional: Positive for fatigue. Negative for diaphoresis and unexpected weight change.  HENT: Negative.   Eyes: Negative.   Respiratory: Negative for cough, chest tightness, shortness of breath and wheezing.   Cardiovascular: Negative for palpitations and leg swelling.  Gastrointestinal: Negative for abdominal pain, diarrhea and nausea.  Endocrine: Negative.   Genitourinary: Negative.   Musculoskeletal: Negative.   Skin: Negative.  Negative for color change.  Neurological: Negative.  Negative for dizziness, weakness, light-headedness, numbness and headaches.  Hematological: Negative  for adenopathy. Does not bruise/bleed easily.  Psychiatric/Behavioral: Negative.     Objective:  BP 136/72 (BP Location: Left Arm, Patient Position: Sitting, Cuff Size: Normal)   Pulse 62   Temp 97.8 F (36.6 C) (Oral)   Resp 16   Ht 5\' 1"  (1.549 m)   Wt 186 lb 4 oz (84.5 kg)   SpO2 97%   BMI 35.19 kg/m   BP Readings from Last 3 Encounters:  02/21/20 136/72  02/15/20 (!) 172/76  01/03/20 (!) 162/72    Wt Readings from Last 3 Encounters:  02/21/20  186 lb 4 oz (84.5 kg)  02/15/20 191 lb (86.6 kg)  01/03/20 194 lb (88 kg)    Physical Exam Vitals reviewed.  Constitutional:      Appearance: She is obese.  HENT:     Nose: Nose normal.     Mouth/Throat:     Mouth: Mucous membranes are moist.  Eyes:     General: No scleral icterus.    Conjunctiva/sclera: Conjunctivae normal.  Cardiovascular:     Rate and Rhythm: Normal rate and regular rhythm.     Heart sounds: No murmur heard.   Pulmonary:     Effort: Pulmonary effort is normal.     Breath sounds: No stridor. No wheezing, rhonchi or rales.  Abdominal:     General: Abdomen is protuberant. Bowel sounds are normal. There is no distension.     Palpations: Abdomen is soft. There is no hepatomegaly, splenomegaly or mass.     Tenderness: There is no abdominal tenderness.  Musculoskeletal:        General: Normal range of motion.     Cervical back: Neck supple.     Right lower leg: No edema.     Left lower leg: No edema.  Lymphadenopathy:     Cervical: No cervical adenopathy.  Skin:    General: Skin is warm and dry.  Neurological:     General: No focal deficit present.     Mental Status: She is alert.  Psychiatric:        Mood and Affect: Mood normal.        Behavior: Behavior normal.     Lab Results  Component Value Date   WBC 7.4 02/15/2020   HGB 12.3 02/15/2020   HCT 36.3 02/15/2020   PLT 461.0 (H) 02/15/2020   GLUCOSE 95 02/21/2020   CHOL 214 (H) 02/15/2020   TRIG 153.0 (H) 02/15/2020   HDL 57.40 02/15/2020   LDLDIRECT 120.0 02/16/2018   LDLCALC 126 (H) 02/15/2020   ALT 14 12/23/2019   AST 16 12/23/2019   NA 139 02/21/2020   K 4.0 02/21/2020   CL 103 02/21/2020   CREATININE 0.76 02/21/2020   BUN 20 02/21/2020   CO2 28 02/21/2020   TSH 2.41 02/22/2019   INR 1.0 12/23/2019   HGBA1C 5.6 02/22/2019    MM DIAG BREAST TOMO UNI RIGHT  Result Date: 05/13/2018 CLINICAL DATA:  69 year old female with focal right axillary pain approximately 2 months ago,  improved today but still tender. EXAM: DIGITAL DIAGNOSTIC RIGHT MAMMOGRAM WITH CAD AND TOMO ULTRASOUND RIGHT BREAST COMPARISON:  Previous exam(s). ACR Breast Density Category b: There are scattered areas of fibroglandular density. FINDINGS: A radiopaque BB was placed at the site of the patient's clinical symptoms in the right axilla. No suspicious mammographic findings are seen deep to the radiopaque BB or within the remainder of the right breast. Mammographic images were processed with CAD. Targeted ultrasound is performed, showing normal tissue  without focal or suspicious sonographic finding. Evaluation of the right axilla was performed. Note is made of morphologically normal lymph nodes. IMPRESSION: No suspicious mammographic or sonographic findings corresponding to the patient's clinical symptoms. Recommendation is for clinical and symptomatic follow-up. RECOMMENDATION: 1. Clinical follow-up recommended for the painful area of concern in the right breast. Any further workup should be based on clinical grounds. 2. Patient is due for annual bilateral screening in March 2020. I have discussed the findings and recommendations with the patient. Results were also provided in writing at the conclusion of the visit. If applicable, a reminder letter will be sent to the patient regarding the next appointment. BI-RADS CATEGORY  1: Negative. Electronically Signed   By: Kristopher Oppenheim M.D.   On: 05/13/2018 15:48   [place] AXILLA RIGHT  Result Date: 05/13/2018 CLINICAL DATA:  69 year old female with focal right axillary pain approximately 2 months ago, improved today but still tender. EXAM: DIGITAL DIAGNOSTIC RIGHT MAMMOGRAM WITH CAD AND TOMO ULTRASOUND RIGHT BREAST COMPARISON:  Previous exam(s). ACR Breast Density Category b: There are scattered areas of fibroglandular density. FINDINGS: A radiopaque BB was placed at the site of the patient's clinical symptoms in the right axilla. No suspicious mammographic findings are seen  deep to the radiopaque BB or within the remainder of the right breast. Mammographic images were processed with CAD. Targeted ultrasound is performed, showing normal tissue without focal or suspicious sonographic finding. Evaluation of the right axilla was performed. Note is made of morphologically normal lymph nodes. IMPRESSION: No suspicious mammographic or sonographic findings corresponding to the patient's clinical symptoms. Recommendation is for clinical and symptomatic follow-up. RECOMMENDATION: 1. Clinical follow-up recommended for the painful area of concern in the right breast. Any further workup should be based on clinical grounds. 2. Patient is due for annual bilateral screening in March 2020. I have discussed the findings and recommendations with the patient. Results were also provided in writing at the conclusion of the visit. If applicable, a reminder letter will be sent to the patient regarding the next appointment. BI-RADS CATEGORY  1: Negative. Electronically Signed   By: Kristopher Oppenheim M.D.   On: 05/13/2018 15:48    Assessment & Plan:   Iviona was seen today for hypertension.  Diagnoses and all orders for this visit:  Essential hypertension- Her blood pressure is well controlled and her electrolytes and renal function are normal.  She complains of fatigue so will discontinue the ARB and will continue the thiazide diuretic. -     Basic metabolic panel; Future -     Basic metabolic panel  Hyperlipidemia LDL goal <130- She has a mildly elevated ASCVD risk score.  Statin intolerance- She is not willing to take a statin.   I have discontinued Kaysha Felty's irbesartan. I am also having her maintain her ipratropium-albuterol, Acapella, albuterol, linaclotide, celecoxib, dextromethorphan-guaiFENesin, Ascorbic Acid (VITAMIN C PO), budesonide-formoterol, potassium chloride, amoxicillin, Multiple Vitamins-Minerals (ZINC PO), Vitamin D, indapamide, aspirin EC, and mirabegron ER.  No  orders of the defined types were placed in this encounter.    Follow-up: Return in about 3 months (around 05/23/2020).  Scarlette Calico, MD

## 2020-02-21 NOTE — Telephone Encounter (Signed)
Pt called back and spoke to scheduling. Pt stated that her BP has been lower than usual.  Pt has been scheduled for 11 am today.

## 2020-02-22 DIAGNOSIS — Z789 Other specified health status: Secondary | ICD-10-CM | POA: Insufficient documentation

## 2020-03-01 ENCOUNTER — Other Ambulatory Visit: Payer: Self-pay

## 2020-03-01 ENCOUNTER — Ambulatory Visit (INDEPENDENT_AMBULATORY_CARE_PROVIDER_SITE_OTHER): Payer: Medicare Other

## 2020-03-01 DIAGNOSIS — Z1231 Encounter for screening mammogram for malignant neoplasm of breast: Secondary | ICD-10-CM | POA: Diagnosis not present

## 2020-03-05 LAB — HM MAMMOGRAPHY

## 2020-03-13 ENCOUNTER — Other Ambulatory Visit: Payer: Self-pay | Admitting: Internal Medicine

## 2020-03-13 DIAGNOSIS — N3281 Overactive bladder: Secondary | ICD-10-CM

## 2020-05-16 ENCOUNTER — Other Ambulatory Visit: Payer: Self-pay | Admitting: Internal Medicine

## 2020-05-16 DIAGNOSIS — I10 Essential (primary) hypertension: Secondary | ICD-10-CM

## 2020-05-16 DIAGNOSIS — M159 Polyosteoarthritis, unspecified: Secondary | ICD-10-CM

## 2020-05-17 ENCOUNTER — Ambulatory Visit: Payer: Medicare Other | Admitting: Internal Medicine

## 2020-05-28 ENCOUNTER — Telehealth: Payer: Self-pay | Admitting: Internal Medicine

## 2020-05-28 NOTE — Telephone Encounter (Signed)
ATC patient unable to reach LM to call back office (x1)  Last OV 05/2019 Has appt 07/2020 with MW

## 2020-05-29 MED ORDER — BUDESONIDE-FORMOTEROL FUMARATE 80-4.5 MCG/ACT IN AERO: INHALATION_SPRAY | RESPIRATORY_TRACT | 5 refills | Status: DC

## 2020-05-29 MED ORDER — IPRATROPIUM-ALBUTEROL 0.5-2.5 (3) MG/3ML IN SOLN: RESPIRATORY_TRACT | 11 refills | Status: AC

## 2020-05-29 NOTE — Telephone Encounter (Signed)
Called and spoke with pt letting her know the info stated by MW that we were sending Rx for duoneb to pharmacy for pt. Pt verbalized understanding. Nothing further needed.

## 2020-05-29 NOTE — Telephone Encounter (Signed)
Ok to refill duoneb x one month then needs ov to regroup with all meds in hand using a trust but verify approach to confirm accurate Medication  Reconciliation

## 2020-05-29 NOTE — Telephone Encounter (Signed)
Called and spoke to pt. Pt is requesting Symbicort and Ipratropium neb solution. Refill sent in for Symbicort. However, pt last seen in 05/2019 by Dr. Melvyn Novas and her med list at that time did not reflect Ipratropium, just albuterol neb. Pt questioning if she should be on Duoneb or just albuterol.   Dr. Melvyn Novas, please advise. Thanks.

## 2020-05-30 ENCOUNTER — Encounter: Payer: Self-pay | Admitting: Internal Medicine

## 2020-05-30 ENCOUNTER — Ambulatory Visit (INDEPENDENT_AMBULATORY_CARE_PROVIDER_SITE_OTHER): Payer: Medicare Other | Admitting: Internal Medicine

## 2020-05-30 ENCOUNTER — Other Ambulatory Visit: Payer: Self-pay

## 2020-05-30 VITALS — BP 132/66 | HR 60 | Temp 98.3°F | Resp 16 | Ht 61.0 in | Wt 191.0 lb

## 2020-05-30 DIAGNOSIS — I700 Atherosclerosis of aorta: Secondary | ICD-10-CM | POA: Diagnosis not present

## 2020-05-30 DIAGNOSIS — I10 Essential (primary) hypertension: Secondary | ICD-10-CM

## 2020-05-30 DIAGNOSIS — N3281 Overactive bladder: Secondary | ICD-10-CM | POA: Diagnosis not present

## 2020-05-30 DIAGNOSIS — D75839 Thrombocytosis, unspecified: Secondary | ICD-10-CM | POA: Diagnosis not present

## 2020-05-30 DIAGNOSIS — K5904 Chronic idiopathic constipation: Secondary | ICD-10-CM | POA: Diagnosis not present

## 2020-05-30 MED ORDER — TRULANCE 3 MG PO TABS: 1.0000 | ORAL_TABLET | Freq: Every day | ORAL | 1 refills | Status: DC

## 2020-05-30 MED ORDER — GEMTESA 75 MG PO TABS: 1.0000 | ORAL_TABLET | Freq: Every day | ORAL | 0 refills | Status: DC

## 2020-05-30 NOTE — Patient Instructions (Signed)

## 2020-05-30 NOTE — Progress Notes (Signed)
Subjective:  Patient ID: Shelley Perez, female    DOB: [birth year]  Age: 69 y.o. MRN: [id number]  CC: Hypertension  This visit occurred during the SARS-CoV-2 public health emergency.  Safety protocols were in place, including screening questions prior to the visit, additional usage of staff PPE, and extensive cleaning of exam room while observing appropriate contact time as indicated for disinfecting solutions.    HPI [name] presents for f/up - She continues to complain of chronic, intermittent RLQ abdominal pain.  She tells me that she recently saw a urologist and a gastroenterologist.  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  The GI has her scheduled for an upper endoscopy to see if she has any esophageal varices.  She complains of weight gain.  She exercises and does not experience headache, blurred vision, chest pain, shortness of breath, or edema.  She complains of chronic constipation.  She is not getting much symptom relief with MiraLAX.  She tried Linzess but says it gave her diarrhea and cramping.  She continues to complain of frequent urination.  She has decided not to take Myrbetriq because she is convinced it is raising her blood pressure.  Outpatient Medications Prior to Visit  Medication Sig Dispense Refill  . albuterol (PROVENTIL) (2.5 MG/3ML) 0.083% nebulizer solution Take 3 mLs (2.5 mg total) by nebulization every 6 (six) hours as needed for wheezing or shortness of breath. 75 mL 1  . amoxicillin (AMOXIL) 500 MG capsule Take 4 capsules by mouth 1 hour prior to appointment ( has a total knee)    . Ascorbic Acid (VITAMIN C PO) Take 1,000 mg by mouth daily.     Marland Kitchen aspirin EC 81 MG tablet Take 1 tablet (81 mg total) by mouth daily. 90 tablet 1  . budesonide-formoterol (SYMBICORT) 80-4.5 MCG/ACT inhaler Take 2 puffs first thing in am and then another 2 puffs about 12 hours later. 1 each 5  . celecoxib (CELEBREX) 100 MG capsule TAKE 1 CAPSULE(100 MG) BY  MOUTH TWICE DAILY 180 capsule 1  . Cholecalciferol (VITAMIN D) 50 MCG (2000 UT) CAPS Take 1 capsule by mouth daily.    Marland Kitchen dextromethorphan-guaiFENesin (MUCINEX DM) 30-600 MG 12hr tablet Take 1 tablet by mouth 2 (two) times daily as needed for cough.    . indapamide (LOZOL) 1.25 MG tablet TAKE 1 TABLET(1.25 MG) BY MOUTH DAILY 90 tablet 0  . ipratropium-albuterol (DUONEB) 0.5-2.5 (3) MG/3ML SOLN Up to one every 4 hours as needed 360 mL 11  . irbesartan (AVAPRO) 150 MG tablet Take 150 mg by mouth daily.    . Misc. Devices (ACAPELLA) MISC Use as needed.    . Multiple Vitamins-Minerals (ZINC PO) 50 mg one daily    . potassium chloride (KLOR-CON) 10 MEQ tablet Take 1 tablet (10 mEq total) by mouth daily. 90 tablet 1  . Zinc 50 MG CAPS Take by mouth.    . linaclotide (LINZESS) 145 MCG CAPS capsule Take 1 capsule (145 mcg total) by mouth daily as needed. 90 capsule 1  . MYRBETRIQ 25 MG TB24 tablet TAKE 1 TABLET(25 MG) BY MOUTH DAILY 90 tablet 1   No facility-administered medications prior to visit.    ROS Review of Systems  Constitutional: Positive for unexpected weight change (wt gain). Negative for appetite change, chills, diaphoresis and fatigue.  HENT: Negative.  Negative for trouble swallowing.   Eyes: Negative.   Respiratory: Negative for cough, chest tightness, shortness of breath and wheezing.   Cardiovascular:  Negative for chest pain, palpitations and leg swelling.  Gastrointestinal: Positive for abdominal pain and constipation. Negative for blood in stool, diarrhea, nausea and vomiting.  Endocrine: Positive for polyuria. Negative for cold intolerance and heat intolerance.  Genitourinary: Positive for difficulty urinating, frequency and urgency. Negative for decreased urine volume and hematuria.  Musculoskeletal: Negative.  Negative for arthralgias and myalgias.  Skin: Negative.  Negative for color change and pallor.  Neurological: Negative.  Negative for dizziness, weakness,  light-headedness, numbness and headaches.  Hematological: Negative for adenopathy. Does not bruise/bleed easily.  Psychiatric/Behavioral: Negative.     Objective:  BP 132/66 (BP Location: Left Arm, Patient Position: Sitting, Cuff Size: Large)   Pulse 60   Temp 98.3 F (36.8 C) (Oral)   Resp 16   Ht 5\' 1"  (1.549 m)   Wt 191 lb (86.6 kg)   SpO2 97%   BMI 36.09 kg/m   BP Readings from Last 3 Encounters:  05/30/20 132/66  02/21/20 136/72  02/15/20 (!) 172/76    Wt Readings from Last 3 Encounters:  05/30/20 191 lb (86.6 kg)  02/21/20 186 lb 4 oz (84.5 kg)  02/15/20 191 lb (86.6 kg)    Physical Exam Vitals reviewed.  Constitutional:      Appearance: She is obese. She is not ill-appearing.  HENT:     Nose: Nose normal.     Mouth/Throat:     Mouth: Mucous membranes are moist.  Eyes:     General: No scleral icterus.    Conjunctiva/sclera: Conjunctivae normal.  Cardiovascular:     Rate and Rhythm: Normal rate and regular rhythm.     Heart sounds: No murmur heard.   Pulmonary:     Effort: Pulmonary effort is normal.     Breath sounds: No stridor. No wheezing, rhonchi or rales.  Abdominal:     General: Abdomen is protuberant. Bowel sounds are decreased. There is no distension.     Palpations: Abdomen is soft. There is no hepatomegaly, splenomegaly or mass.     Tenderness: There is no abdominal tenderness.     Hernia: No hernia is present.  Musculoskeletal:        General: Normal range of motion.     Cervical back: Neck supple.     Right lower leg: No edema.     Left lower leg: No edema.  Lymphadenopathy:     Cervical: No cervical adenopathy.  Skin:    General: Skin is warm and dry.     Coloration: Skin is not pale.  Neurological:     General: No focal deficit present.     Mental Status: She is alert. Mental status is at baseline.  Psychiatric:        Mood and Affect: Mood normal.        Behavior: Behavior normal.     Lab Results  Component Value Date   WBC  7.4 02/15/2020   HGB 12.3 02/15/2020   HCT 36.3 02/15/2020   PLT 461.0 (H) 02/15/2020   GLUCOSE 95 02/21/2020   CHOL 214 (H) 02/15/2020   TRIG 153.0 (H) 02/15/2020   HDL 57.40 02/15/2020   LDLDIRECT 120.0 02/16/2018   LDLCALC 126 (H) 02/15/2020   ALT 14 12/23/2019   AST 16 12/23/2019   NA 139 02/21/2020   K 4.0 02/21/2020   CL 103 02/21/2020   CREATININE 0.76 02/21/2020   BUN 20 02/21/2020   CO2 28 02/21/2020   TSH 2.41 02/22/2019   INR 1.0 12/23/2019   HGBA1C 5.6  02/22/2019    MM DIAG BREAST TOMO UNI RIGHT  Result Date: 05/13/2018 CLINICAL DATA:  69 year old female with focal right axillary pain approximately 2 months ago, improved today but still tender. EXAM: DIGITAL DIAGNOSTIC RIGHT MAMMOGRAM WITH CAD AND TOMO ULTRASOUND RIGHT BREAST COMPARISON:  Previous exam(s). ACR Breast Density Category b: There are scattered areas of fibroglandular density. FINDINGS: A radiopaque BB was placed at the site of the patient's clinical symptoms in the right axilla. No suspicious mammographic findings are seen deep to the radiopaque BB or within the remainder of the right breast. Mammographic images were processed with CAD. Targeted ultrasound is performed, showing normal tissue without focal or suspicious sonographic finding. Evaluation of the right axilla was performed. Note is made of morphologically normal lymph nodes. IMPRESSION: No suspicious mammographic or sonographic findings corresponding to the patient's clinical symptoms. Recommendation is for clinical and symptomatic follow-up. RECOMMENDATION: 1. Clinical follow-up recommended for the painful area of concern in the right breast. Any further workup should be based on clinical grounds. 2. Patient is due for annual bilateral screening in March 2020. I have discussed the findings and recommendations with the patient. Results were also provided in writing at the conclusion of the visit. If applicable, a reminder letter will be sent to the patient  regarding the next appointment. BI-RADS CATEGORY  1: Negative. Electronically Signed   By: Kristopher Oppenheim M.D.   On: 05/13/2018 15:48   [place] AXILLA RIGHT  Result Date: 05/13/2018 CLINICAL DATA:  69 year old female with focal right axillary pain approximately 2 months ago, improved today but still tender. EXAM: DIGITAL DIAGNOSTIC RIGHT MAMMOGRAM WITH CAD AND TOMO ULTRASOUND RIGHT BREAST COMPARISON:  Previous exam(s). ACR Breast Density Category b: There are scattered areas of fibroglandular density. FINDINGS: A radiopaque BB was placed at the site of the patient's clinical symptoms in the right axilla. No suspicious mammographic findings are seen deep to the radiopaque BB or within the remainder of the right breast. Mammographic images were processed with CAD. Targeted ultrasound is performed, showing normal tissue without focal or suspicious sonographic finding. Evaluation of the right axilla was performed. Note is made of morphologically normal lymph nodes. IMPRESSION: No suspicious mammographic or sonographic findings corresponding to the patient's clinical symptoms. Recommendation is for clinical and symptomatic follow-up. RECOMMENDATION: 1. Clinical follow-up recommended for the painful area of concern in the right breast. Any further workup should be based on clinical grounds. 2. Patient is due for annual bilateral screening in March 2020. I have discussed the findings and recommendations with the patient. Results were also provided in writing at the conclusion of the visit. If applicable, a reminder letter will be sent to the patient regarding the next appointment. BI-RADS CATEGORY  1: Negative. Electronically Signed   By: Kristopher Oppenheim M.D.   On: 05/13/2018 15:48    Assessment & Plan:   Sephora was seen today for hypertension.  Diagnoses and all orders for this visit:  Atherosclerosis of aorta ([place])- She is not willing to take a statin for CV risk reduction.  Essential hypertension- Her blood  pressure is adequately well controlled. -     BASIC METABOLIC PANEL WITH GFR; Future  Chronic idiopathic constipation- She will try Amitiza for this. -     Discontinue: Plecanatide (TRULANCE) 3 MG TABS; Take 1 tablet by mouth daily. -     BASIC METABOLIC PANEL WITH GFR; Future -     Magnesium; Future -     TSH; Future -  lubiprostone (AMITIZA) 8 MCG capsule; Take 1 capsule (8 mcg total) by mouth 2 (two) times daily with a meal.  OAB (overactive bladder) -     Vibegron (GEMTESA) 75 MG TABS; Take 1 tablet by mouth daily.  Thrombocytosis- She has no symptoms or complications related to this.  I will monitor her platelet count as well as her hemoglobin, hematocrit, and white cell count. -     CBC with Differential/Platelet; Future   I have discontinued [employer] linaclotide, Myrbetriq, and Trulance. I am also having her start on Gemtesa and lubiprostone. Additionally, I am having her maintain her Acapella, albuterol, dextromethorphan-guaiFENesin, Ascorbic Acid (VITAMIN C PO), potassium chloride, amoxicillin, Multiple Vitamins-Minerals (ZINC PO), Vitamin D, aspirin EC, indapamide, celecoxib, budesonide-formoterol, ipratropium-albuterol, Zinc, and irbesartan.  Meds ordered this encounter  Medications  . DISCONTD: Plecanatide (TRULANCE) 3 MG TABS    Sig: Take 1 tablet by mouth daily.    Dispense:  90 tablet    Refill:  1  . Vibegron (GEMTESA) 75 MG TABS    Sig: Take 1 tablet by mouth daily.    Dispense:  36 tablet    Refill:  0  . lubiprostone (AMITIZA) 8 MCG capsule    Sig: Take 1 capsule (8 mcg total) by mouth 2 (two) times daily with a meal.    Dispense:  180 capsule    Refill:  1     Follow-up: Return in about 3 months (around 08/30/2020).  Scarlette Calico, MD

## 2020-05-31 ENCOUNTER — Telehealth: Payer: Self-pay | Admitting: Internal Medicine

## 2020-05-31 MED ORDER — LUBIPROSTONE 8 MCG PO CAPS: 8.0000 ug | ORAL_CAPSULE | Freq: Two times a day (BID) | ORAL | 1 refills | Status: DC

## 2020-05-31 NOTE — Telephone Encounter (Signed)
   Patient calling to report Plecanatide (TRULANCE) 3 MG TABS not covered by insurance. Consider order for Amitiza.(send to [facility])  Patient also requesting Vitamin D to ordered to [place], [place]

## 2020-06-13 ENCOUNTER — Ambulatory Visit: Payer: Medicare Other | Admitting: Internal Medicine

## 2020-06-15 ENCOUNTER — Other Ambulatory Visit (INDEPENDENT_AMBULATORY_CARE_PROVIDER_SITE_OTHER): Payer: Medicare Other

## 2020-06-15 ENCOUNTER — Ambulatory Visit: Payer: Medicare Other | Admitting: Internal Medicine

## 2020-06-15 ENCOUNTER — Encounter: Payer: Self-pay | Admitting: Internal Medicine

## 2020-06-15 DIAGNOSIS — I10 Essential (primary) hypertension: Secondary | ICD-10-CM | POA: Diagnosis not present

## 2020-06-15 DIAGNOSIS — K5904 Chronic idiopathic constipation: Secondary | ICD-10-CM | POA: Diagnosis not present

## 2020-06-15 DIAGNOSIS — D75839 Thrombocytosis, unspecified: Secondary | ICD-10-CM

## 2020-06-15 LAB — CBC WITH DIFFERENTIAL/PLATELET
Basophils Absolute: 0.1 10*3/uL (ref 0.0–0.1)
Basophils Relative: 1.1 % (ref 0.0–3.0)
Eosinophils Absolute: 0.3 10*3/uL (ref 0.0–0.7)
Eosinophils Relative: 4.9 % (ref 0.0–5.0)
HCT: 36.9 % (ref 36.0–46.0)
Hemoglobin: 12.4 g/dL (ref 12.0–15.0)
Lymphocytes Relative: 36.7 % (ref 12.0–46.0)
Lymphs Abs: 2.5 10*3/uL (ref 0.7–4.0)
MCHC: 33.6 g/dL (ref 30.0–36.0)
MCV: 88.8 fl (ref 78.0–100.0)
Monocytes Absolute: 0.7 10*3/uL (ref 0.1–1.0)
Monocytes Relative: 10.2 % (ref 3.0–12.0)
Neutro Abs: 3.2 10*3/uL (ref 1.4–7.7)
Neutrophils Relative %: 47.1 % (ref 43.0–77.0)
Platelets: 461 10*3/uL — ABNORMAL HIGH (ref 150.0–400.0)
RBC: 4.15 Mil/uL (ref 3.87–5.11)
RDW: 12.9 % (ref 11.5–15.5)
WBC: 6.8 10*3/uL (ref 4.0–10.5)

## 2020-06-15 LAB — COMPREHENSIVE METABOLIC PANEL
ALT: 19 U/L (ref 0–35)
AST: 17 U/L (ref 0–37)
Albumin: 4 g/dL (ref 3.5–5.2)
Alkaline Phosphatase: 88 U/L (ref 39–117)
BUN: 15 mg/dL (ref 6–23)
CO2: 31 mEq/L (ref 19–32)
Calcium: 9.2 mg/dL (ref 8.4–10.5)
Chloride: 101 mEq/L (ref 96–112)
Creatinine, Ser: 0.68 mg/dL (ref 0.40–1.20)
GFR: 88.71 mL/min (ref >60.00)
Glucose, Bld: 89 mg/dL (ref 70–99)
Potassium: 4 mEq/L (ref 3.5–5.1)
Sodium: 138 mEq/L (ref 135–145)
Total Bilirubin: 0.6 mg/dL (ref 0.2–1.2)
Total Protein: 7.1 g/dL (ref 6.0–8.3)

## 2020-06-15 LAB — TSH: TSH: 2.64 u[IU]/mL (ref 0.35–4.50)

## 2020-06-15 LAB — MAGNESIUM: Magnesium: 1.8 mg/dL (ref 1.5–2.5)

## 2020-06-22 ENCOUNTER — Telehealth: Payer: Self-pay

## 2020-06-22 NOTE — Telephone Encounter (Signed)
Pt states she did receive the kit, that it's sitting in her bathroom. Pt denies questions about kit use & states she will complete it.

## 2020-06-26 ENCOUNTER — Ambulatory Visit: Payer: Medicare Other | Admitting: Internal Medicine

## 2020-07-10 ENCOUNTER — Other Ambulatory Visit: Payer: Self-pay | Admitting: Internal Medicine

## 2020-07-10 DIAGNOSIS — I10 Essential (primary) hypertension: Secondary | ICD-10-CM

## 2020-07-10 MED ORDER — POTASSIUM CHLORIDE ER 10 MEQ PO TBCR: 10.0000 meq | EXTENDED_RELEASE_TABLET | Freq: Every day | ORAL | 1 refills | Status: AC

## 2020-07-26 DIAGNOSIS — H02834 Dermatochalasis of left upper eyelid: Secondary | ICD-10-CM | POA: Diagnosis not present

## 2020-07-26 DIAGNOSIS — H04123 Dry eye syndrome of bilateral lacrimal glands: Secondary | ICD-10-CM | POA: Diagnosis not present

## 2020-07-26 DIAGNOSIS — H02831 Dermatochalasis of right upper eyelid: Secondary | ICD-10-CM | POA: Diagnosis not present

## 2020-07-26 DIAGNOSIS — H2513 Age-related nuclear cataract, bilateral: Secondary | ICD-10-CM | POA: Diagnosis not present

## 2020-07-27 ENCOUNTER — Encounter: Payer: Self-pay | Admitting: Internal Medicine

## 2020-07-27 ENCOUNTER — Ambulatory Visit: Payer: Medicare Other | Admitting: Internal Medicine

## 2020-07-27 ENCOUNTER — Other Ambulatory Visit: Payer: Self-pay

## 2020-07-27 DIAGNOSIS — J449 Chronic obstructive pulmonary disease, unspecified: Secondary | ICD-10-CM

## 2020-07-27 NOTE — Progress Notes (Signed)
Subjective:    Patient ID: Shelley Perez, female    DOB: [birth year]    MRN: [id number]    Brief patient profile:  54 yowf quit smoking 2015 with some sensation of midline chest discomfort brought on by smoking that resolved @ 172lb p quit smoking with no breathing problems though tendency for colds going to chest and persistent sore throat since ET 11/22/116 for R knee then  worst episode middle of Feb 2017 >  sev ov's rx as flu with tamiflu / pred/ abx/ only some better so referred to pulmonary clinic 11/22/2015 by Dr  Philis Fendt at [facility]   History of Present Illness  11/22/2015 1st [facility] office visit/ Shelley Perez   Chief Complaint  Patient presents with  . Pulmonary Consult    Referred by Dr. Philis Fendt. Pt c/o cough and SOB since she was dxed with Influenza in Feb 2017. Cough is prod with yellow sputum.    rx with doxy and prednisone x 4 days and qvar not better. Cough is worse at hs and in am and doe x more than slow walk on flat level = MMRC2 Having to use neb saba qid whereas prior to feb 2017 was not using it at all and prev on no maint rx  rec Plan A = Automatic =  symbicort 160 Take 2 puffs first thing in am and then another 2 puffs about 12 hours later.  Plan B = Backup  ok to use the nebulizer up to every 4 hours but if start needing it regularly call for immediate appointment GERD diet      12/28/2015  f/u ov/Shelley Perez re: GOLD 0 copd/ mild AB/uacs Chief Complaint  Patient presents with  . Follow-up    Breathing has improved. She has not had to use albuterol inhaler or neb.   can't take ppi regularly due to joints aching  Not taking pepcid at all  rec You do have significant copd and unlikely you ever will  Continue symbicort 80 Take 2 puffs first thing in am and then another 2 puffs about 12 hours later if you think you need it.  Try pepcid ac 20 mg after bfast and after supper  GERD diet     05/21/2017  f/u ov/Shelley Perez re:  GOLD 0 copd/ AB with prob UACS  Chief  Complaint  Patient presents with  . Hospitalization Follow-up    Pt in hospital 05/13/17-05/15/17 due to COPD. States that she is still coughing with yellow to white mucus and c/o SOB all the time that is worse early in the morning and at night. Pt states that she is sore in the chest due to being hospitalized.  after last visit did fine on symbicort 80 2pffs daily but stopped it after several months but still had trouble had trouble with mowing grass due to cough/ sob and did not call as better when avoided exposure  [place] since late summer  2018 with recurrent cough/sob  eval by Ronnald Ramp 04/28/17 and no better p omnicef and prednisone  admtted 9/26-28/ 2018  [place] with dx "aecopd" rx with steroids and duoneb and better but not back to baseline ex tol and "never want to get that bad again. / not on any gerd rx  Still with coughing fits/ quite harsh with obvious pseudowheeze  And can't sleep flat due to cough and chest gen sore ant with cough rec For cough > mucinex dm up to 1200 mg every 12 hours with flutter valve as  much as possible  And if still coughing then use your cough syrup as needed   Plan A = Automatic = symbicort  80 Take 2 puffs first thing in am and then another 2 puffs about 12 hours later.  Pecpid 20 mg after bfast and supper  Work on inhaler technique:   GERD   Plan B = Backup Only use your nebulizer as a rescue medication  Prednisone 10 mg take  4 each am x 2 days,   2 each am x 2 days,  1 each am x 2 days and stop      09/10/2017  f/u ov/Shelley Perez re:  Cough flare since summer of 2018  variant asthma vs uacs  Chief Complaint  Patient presents with  . Follow-up    Breathing is doing well and she has not needed her neb. No new co's.   main issue is nasal obst at hs, not using  Any not nasal sprays / not using bed blocks  Overt hb symptoms with mild orophanygeal sense of dysphagia  Not limited by breathing from desired activities   rec Continue Plan A = Automatic =  symbicort  80 Take 2 puffs first thing in am and then another 2 puffs about 12 hours later.  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 Follow-up    Cough is "pretty good" only has occ cough now- occ prod with clear sputum.  She rarely uses neb. She states she had a fall approx 2 months ago and fractured 2 ribs on the left.   Dyspnea:  Worse = MMRC1 = can walk nl pace, flat grade, can't hurry or go uphills or steps s sob   Cough: much better Sleep: about 10 degrees  SABA use:  Not using at all  Still hurting on L where fx 2 ribs  - pain down to a 2/10  s hematoma/bruising    09/13/2018  f/u ov/Shelley Perez re: copd GOLD 0  Chief Complaint  Patient presents with  . Follow-up    Breathing is doing well. She rarely uses her duoneb.   Dyspnea:  R Heels stops her before beathing Cough: none Sleeping: about 10 degrees hob up  SABA use: rarely  02: none  rec Change symbicort to 80 2 pffs each am and the other 2 puffs are optional  Work on inhaler technique:     07/27/2020  f/u ov/Shelley Perez re:  COPD  0 just using symb 80  2 in am Chief Complaint  Patient presents with  . Follow-up    Breathing is overall doing well and no new co's today   Dyspnea: no regular walking but Not limited by breathing from desired activities   Cough: some am congestion / min mucoid using mucinex and flutter though not really timing prn correctly  Sleeping: 10 degrees  SABA use: none at all  02: none    No obvious  day to day or daytime variability or assoc excess/  purulent sputum or mucus plugs or hemoptysis or cp or chest tightness, subjective wheeze or overt sinus or hb symptoms.   Sleeping as aov  without nocturnal  or early am exacerbation  of respiratory  c/o's or need for noct saba. Also denies any obvious fluctuation of symptoms with weather or environmental changes or other aggravating or alleviating factors except as outlined above   No unusual exposure hx or h/o childhood pna/ asthma or knowledge of premature birth.  Current Allergies, Complete Past Medical History, Past Surgical History, Family History, and Social History were reviewed in [employer] record.  ROS  The following are not active complaints unless bolded Hoarseness, sore throat, dysphagia, dental problems, itching, sneezing,  nasal congestion or discharge of excess mucus or purulent secretions, ear ache,   fever, chills, sweats, unintended wt loss or wt gain, classically pleuritic or exertional cp,  orthopnea pnd or arm/hand swelling  or leg swelling, presyncope, palpitations, abdominal pain, anorexia, nausea, vomiting, diarrhea  or change in bowel habits or change in bladder habits, change in stools or change in urine, dysuria, hematuria,  rash, arthralgias, visual complaints, headache, numbness, weakness or ataxia or problems with walking or coordination,  change in mood or  memory.        Current Meds  Medication Sig  . Ascorbic Acid (VITAMIN C PO) Take 1,000 mg by mouth daily.   Marland Kitchen aspirin EC 81 MG tablet Take 1 tablet (81 mg total) by mouth daily.  . budesonide-formoterol (SYMBICORT) 80-4.5 MCG/ACT inhaler Take 2 puffs first thing in am and then another 2 puffs about 12 hours later.  . celecoxib (CELEBREX) 100 MG capsule TAKE 1 CAPSULE(100 MG) BY MOUTH TWICE DAILY  . Cholecalciferol (VITAMIN D) 50 MCG (2000 UT) CAPS Take 1 capsule by mouth daily.  Marland Kitchen dextromethorphan-guaiFENesin (MUCINEX DM) 30-600 MG 12hr tablet Take 1 tablet by mouth 2 (two) times daily as  needed for cough.  . indapamide (LOZOL) 1.25 MG tablet TAKE 1 TABLET(1.25 MG) BY MOUTH DAILY  . ipratropium-albuterol (DUONEB) 0.5-2.5 (3) MG/3ML SOLN Up to one every 4 hours as needed  . irbesartan (AVAPRO) 150 MG tablet Take 150 mg by mouth daily.  . Misc. Devices (ACAPELLA) MISC Use as needed.  . Multiple Vitamins-Minerals (ZINC PO) 50 mg one daily  . potassium chloride (KLOR-CON) 10 MEQ tablet Take 1 tablet (10 mEq total) by mouth daily.  . Zinc 50 MG CAPS Take by mouth.               Objective:   Physical Exam    07/27/2020      193 06/14/2019     190  09/13/2018       193  12/10/2017       191  09/10/2017       191  05/21/2017       185  12/28/2015       187  12/11/2015        187   11/22/15 191 lb 12.8 oz (87 kg)  11/11/15 184 lb 12.8 oz (83.825 kg)  11/10/15 187 lb (84.823 kg)     Vital signs reviewed  07/27/2020  - Note at rest 02 sats  97% on RA       full top denture    HEENT : pt wearing mask not removed for exam due to covid -19 concerns.    NECK :  without JVD/Nodes/TM/ nl carotid upstrokes  bilaterally   LUNGS: no acc muscle use,  Nl contour chest which is clear to A and P bilaterally without cough on insp or exp maneuvers   CV:  RRR  no s3 or murmur or increase in P2, and no edema   ABD:  Obese soft and nontender with nl inspiratory excursion in the supine position. No bruits or organomegaly appreciated, bowel sounds nl  MS:  Nl gait/ ext warm without deformities, calf tenderness, cyanosis or clubbing No obvious joint restrictions   SKIN: warm and dry without lesions    NEURO:  alert, approp, nl sensorium with  no motor or cerebellar deficits apparent.              Assessment & Plan:

## 2020-07-27 NOTE — Patient Instructions (Signed)
No change in my recommendations    I very strongly recommend you get the moderna or pfizer vaccine as soon as possible based on your risk of dying from the virus  and the proven safety and benefit of these vaccines against even the delta variant.  This can save your life as well as  those of your loved ones,  especially if they are also not vaccinated.     Please schedule a follow up visit in 12 months but call sooner if needed

## 2020-07-27 NOTE — Assessment & Plan Note (Signed)
Quit smoking 2015 11/22/2015   try symbicort 160 2bid  - 12/11/2015   try reducing to symb 80 2bid  - PFT's  12/28/2015  FEV1 1.95 (91 % ) ratio 85  p 6 % improvement from saba p no rx prior to study with DLCO  85 % corrects to 106 % for alv volume    - 09/13/2018  After extensive coaching inhaler device,  effectiveness =    75%   All goals of chronic asthma control met including optimal function and elimination of symptoms with minimal need for rescue therapy.  Contingencies discussed in full including contacting this office immediately if not controlling the symptoms using the rule of two's.     F/u can be yearly    Each maintenance medication was reviewed in detail including most importantly the difference between maintenance and as needed and under what circumstances the prns are to be used.  Please see AVS for specific  Instructions which are unique to this visit and I personally typed out  which were reviewed in detail in writing with the patient and a copy provided.         In addition: Patient is unvaccinated and was informed of the seriousness of COVID 19 infection as a direct risk to lung health as well as safety to close contacts and should continue to wear a facemask in public and minimize exposure to public locations but especially avoid any area or activity where non-close contacts are not observing distancing or wearing an appropriate face mask.  I strongly recommended pt take either of the vaccines available through local drugstores based on updated information on millions of Americans treated with the [place] products  which have proven both safe and  effective even against the new delta variant.      Each maintenance medication was reviewed in detail including emphasizing most importantly the difference between maintenance and prns and under what circumstances the prns are to be triggered using an action plan format where appropriate.  Total time for H and P, chart  review, counseling, teaching device and generating customized AVS unique to this office visit / charting = 26 min

## 2020-07-31 ENCOUNTER — Telehealth: Payer: Self-pay | Admitting: Internal Medicine

## 2020-07-31 NOTE — Progress Notes (Signed)
  Chronic Care Management   Outreach Note  07/31/2020 Name: Shelley Perez MRN: [id number] DOB: 1951/01/04  Referred by: Janith Lima, MD Reason for referral : No chief complaint on file.   An unsuccessful telephone outreach was attempted today. The patient was referred to the pharmacist for assistance with care management and care coordination.   Follow Up Plan:   Carley Perdue [employer] Scheduler

## 2020-08-01 ENCOUNTER — Telehealth: Payer: Self-pay | Admitting: Internal Medicine

## 2020-08-01 NOTE — Progress Notes (Signed)
°  Chronic Care Management   Outreach Note  08/01/2020 Name: Shelley Perez MRN: [id number] DOB: 01-21-1951  Referred by: Janith Lima, MD Reason for referral : No chief complaint on file.   An unsuccessful telephone outreach was attempted today. The patient was referred to the pharmacist for assistance with care management and care coordination.   Follow Up Plan:   Carley Perdue [employer] Scheduler

## 2020-08-06 ENCOUNTER — Telehealth: Payer: Self-pay | Admitting: Internal Medicine

## 2020-08-06 NOTE — Progress Notes (Signed)
  Chronic Care Management   Outreach Note  08/06/2020 Name: Shelley Perez MRN: [id number] DOB: Dec 03, 1950  Referred by: Janith Lima, MD Reason for referral : No chief complaint on file.   Third unsuccessful telephone outreach was attempted today. The patient was referred to the pharmacist for assistance with care management and care coordination.   Follow Up Plan:   Carley Perdue [employer] Scheduler

## 2020-08-19 ENCOUNTER — Other Ambulatory Visit: Payer: Self-pay | Admitting: Internal Medicine

## 2020-08-19 DIAGNOSIS — I10 Essential (primary) hypertension: Secondary | ICD-10-CM

## 2020-08-19 DIAGNOSIS — I700 Atherosclerosis of aorta: Secondary | ICD-10-CM

## 2020-08-20 ENCOUNTER — Other Ambulatory Visit: Payer: Self-pay | Admitting: Internal Medicine

## 2020-08-27 ENCOUNTER — Other Ambulatory Visit: Payer: Self-pay | Admitting: Internal Medicine

## 2020-08-29 ENCOUNTER — Other Ambulatory Visit: Payer: Self-pay | Admitting: Internal Medicine

## 2020-08-29 ENCOUNTER — Telehealth: Payer: Self-pay | Admitting: Internal Medicine

## 2020-08-29 NOTE — Telephone Encounter (Signed)
Vitamin D, Ergocalciferol, (DRISDOL) 50000 UNITS CAPS capsule [[id number]]  Patient states she has spoken to Dr. Ronnald Ramp about this before but she is wondering if she can get this refilled because in the winter her vitamin D drops

## 2020-08-31 ENCOUNTER — Telehealth: Payer: Self-pay | Admitting: Internal Medicine

## 2020-08-31 NOTE — Telephone Encounter (Signed)
Per PCP pt to take OTC vit D 2000u with a glad a whole milk.  Pt has been informed and expressed understanding.

## 2020-08-31 NOTE — Telephone Encounter (Signed)
    Patient's daughter calling for status of refill for Cholecalciferol (VITAMIN D) 50 MCG (2000 UT) CAPS Please send to  [place], [address] [place] Phone:  [phone]

## 2020-08-31 NOTE — Telephone Encounter (Signed)
1.Medication Requested:Cholecalciferol 1.25 MG (50000 UT) capsule    2. Pharmacy (Name, [facility]): [facility] [id number] - [place], [place]  3. On Med List: No   4. Last Visit with PCP: 10.13.21  5. Next visit date with PCP: n/a    Agent: Please be advised that RX refills may take up to 3 business days. We ask that you follow-up with your pharmacy.

## 2020-08-31 NOTE — Telephone Encounter (Signed)
Denied.  Pt informed earlier of instruction from PCP.

## 2020-10-01 NOTE — Telephone Encounter (Signed)
LVM instructing pt to complete cologuard; call office if questions.

## 2020-10-03 DIAGNOSIS — Z96651 Presence of right artificial knee joint: Secondary | ICD-10-CM | POA: Diagnosis not present

## 2020-10-08 ENCOUNTER — Telehealth: Payer: Self-pay | Admitting: Internal Medicine

## 2020-10-08 NOTE — Telephone Encounter (Signed)
LVM for pt to rtn my call to schedule AWV with [facility]. Please schedule if pt calls the office.  ?

## 2020-12-08 ENCOUNTER — Other Ambulatory Visit: Payer: Self-pay | Admitting: Internal Medicine

## 2020-12-08 DIAGNOSIS — I10 Essential (primary) hypertension: Secondary | ICD-10-CM

## 2021-02-08 ENCOUNTER — Other Ambulatory Visit: Payer: Self-pay | Admitting: Internal Medicine

## 2021-02-14 ENCOUNTER — Telehealth: Payer: Self-pay | Admitting: Internal Medicine

## 2021-02-14 NOTE — Chronic Care Management (AMB) (Signed)
  Chronic Care Management   Outreach Note  02/14/2021 Name: Shelley Perez MRN: [id number] DOB: 03/03/1951  Referred by: Janith Lima, MD Reason for referral : No chief complaint on file.   An unsuccessful telephone outreach was attempted today. The patient was referred to the pharmacist for assistance with care management and care coordination.   Follow Up Plan:   Lauretta Grill [employer] Scheduler

## 2021-02-21 ENCOUNTER — Telehealth: Payer: Self-pay | Admitting: Internal Medicine

## 2021-02-21 NOTE — Progress Notes (Signed)
  Chronic Care Management   Note  02/21/2021 Name: Shelley Perez MRN: [id number] DOB: October 31, 1950  Shelley Perez is a 70 y.o. year old female who is a primary care patient of Janith Lima, MD. I reached out to Katrina Stack by phone today in response to a referral sent by Shelley Perez PCP, Janith Lima, MD.   Shelley Perez was given information about Chronic Care Management services today including:  CCM service includes personalized support from designated clinical staff supervised by her physician, including individualized plan of care and coordination with other care providers 24/7 contact phone numbers for assistance for urgent and routine care needs. Service will only be billed when office clinical staff spend 20 minutes or more in a month to coordinate care. Only one practitioner may furnish and bill the service in a calendar month. The patient may stop CCM services at any time (effective at the end of the month) by phone call to the office staff.   Patient did not agree to enrollment in care management services and does not wish to consider at this time.  Follow up plan:   Shelley Perez [employer] Scheduler

## 2021-03-28 ENCOUNTER — Other Ambulatory Visit: Payer: Self-pay | Admitting: Internal Medicine

## 2021-03-28 DIAGNOSIS — I700 Atherosclerosis of aorta: Secondary | ICD-10-CM

## 2021-03-28 DIAGNOSIS — I10 Essential (primary) hypertension: Secondary | ICD-10-CM

## 2021-04-06 ENCOUNTER — Other Ambulatory Visit: Payer: Self-pay | Admitting: Internal Medicine

## 2021-04-06 DIAGNOSIS — I10 Essential (primary) hypertension: Secondary | ICD-10-CM

## 2021-04-06 DIAGNOSIS — I700 Atherosclerosis of aorta: Secondary | ICD-10-CM

## 2021-04-23 ENCOUNTER — Telehealth: Payer: Self-pay

## 2021-04-23 NOTE — Telephone Encounter (Signed)
LVM instructions for pt to call back to schedule appt with PCP.

## 2021-09-24 ENCOUNTER — Encounter: Payer: Self-pay | Admitting: Internal Medicine

## 2021-09-24 ENCOUNTER — Ambulatory Visit: Payer: Medicare Other | Admitting: Internal Medicine

## 2021-09-24 ENCOUNTER — Other Ambulatory Visit: Payer: Self-pay

## 2021-09-24 DIAGNOSIS — J449 Chronic obstructive pulmonary disease, unspecified: Secondary | ICD-10-CM | POA: Diagnosis not present

## 2021-09-24 MED ORDER — FAMOTIDINE 20 MG PO TABS: ORAL_TABLET | ORAL | 11 refills | Status: AC

## 2021-09-24 MED ORDER — BUDESONIDE-FORMOTEROL FUMARATE 80-4.5 MCG/ACT IN AERO: INHALATION_SPRAY | RESPIRATORY_TRACT | 11 refills | Status: AC

## 2021-09-24 MED ORDER — PREDNISONE 10 MG PO TABS: ORAL_TABLET | ORAL | 0 refills | Status: DC

## 2021-09-24 NOTE — Patient Instructions (Addendum)
Plan A = Automatic = Always=    Symbicort 80 Take 2 puffs first thing in am and then another 2 puffs about 12 hours later.    Work on inhaler technique:  relax and gently blow all the way out then take a nice smooth full deep breath back in, triggering the inhaler at same time you start breathing in.  Hold for up to 5 seconds if you can. Blow out thru nose. Rinse and gargle with water when done.  If mouth or throat bother you at all,  try brushing teeth/gums/tongue with arm and hammer toothpaste/ make a slurry and gargle and spit out.   Keep the empty cannister       Plan B = Backup (to supplement plan A, not to replace it) Only use your albuterol - ipatropium as a rescue medication to be used if you can't catch your breath by resting or doing a relaxed purse lip breathing pattern.  - The less you use it, the better it will work when you need it. - Ok to use the inhaler up to every 4 hours if you must but call for appointment    For cough/ congestion take mucinex dm 1200 mg every 12 hours as needed with flutter as much as possible   Prednisone 10 mg take  4 each am x 2 days,   2 each am x 2 days,  1 each am x 2 days and stop   Pepcid 20 mg one at bedtime   GERD (REFLUX)  is an extremely common cause of respiratory symptoms just like yours , many times with no obvious heartburn at all.    It can be treated with medication, but also with lifestyle changes including elevation of the head of your bed (ideally with 6 -8inch blocks under the headboard of your bed),  Smoking cessation, avoidance of late meals, excessive alcohol, and avoid fatty foods, chocolate, peppermint, colas, red wine, and acidic juices such as orange juice.  NO MINT OR MENTHOL PRODUCTS SO NO COUGH DROPS  USE SUGARLESS CANDY INSTEAD (Jolley ranchers or Stover's or Life Savers) or even ice chips will also do - the key is to swallow to prevent all throat clearing. NO OIL BASED VITAMINS - use powdered substitutes.  Avoid fish oil  when coughing.    Please schedule a follow up office visit in 6 weeks, call sooner if needed with all medications /inhalers/ solutions in hand so we can verify exactly what you are taking. This includes all medications from all doctors and over the [place] separate them into two bags:  the ones you take automatically, no matter what, vs the ones you take just when you feel you need them "BAG #2 is UP TO YOU"  - this will really help [place] help you take your medications more effectively.

## 2021-09-24 NOTE — Progress Notes (Signed)
Subjective:    Patient ID: Shelley Perez, female    DOB: [birth year]    MRN: [id number]    Brief patient profile:  70 yowf quit smoking 2015 with some sensation of midline chest discomfort brought on by smoking that resolved @ 172lb p quit smoking with no breathing problems though tendency for colds going to chest and persistent sore throat since ET 11/22/116 for R knee then  worst episode middle of Feb 2017 >  sev ov's rx as flu with tamiflu / pred/ abx/ only some better so referred to pulmonary clinic 11/22/2015 by Dr  Shelley Perez at [facility]   History of Present Illness  11/22/2015 1st [facility] office visit/ Shelley Perez   Chief Complaint  Patient presents with   Pulmonary Consult    Referred by Dr. Philis Perez. Pt c/o cough and SOB since she was dxed with Influenza in Feb 2017. Cough is prod with yellow sputum.    rx with doxy and prednisone x 4 days and qvar not better. Cough is worse at hs and in am and doe x more than slow walk on flat level = MMRC2 Having to use neb saba qid whereas prior to feb 2017 was not using it at all and prev on no maint rx  rec Plan A = Automatic =  symbicort 160 Take 2 puffs first thing in am and then another 2 puffs about 12 hours later.  Plan B = Backup  ok to use the nebulizer up to every 4 hours but if start needing it regularly call for immediate appointment GERD diet     07/27/2020  f/u ov/Shelley Perez re:  COPD  0 just using symb 19  2 in am Chief Complaint  Patient presents with   Follow-up    Breathing is overall doing well and no new co's today   Dyspnea: no regular walking but Not limited by breathing from desired activities   Cough: some am congestion / min mucoid using mucinex and flutter though not really timing prn correctly  Sleeping: 10 degrees  SABA use: none at all  02: none  Rec No change in my recommendations  I very strongly recommend you get the moderna or pfizer vaccine    09/24/2021  f/u ov/Shelley Perez re: COPD GOLD 0  maint on nothing x  one month and more am congestion since quit  but no more need for saba  Chief Complaint  Patient presents with   Follow-up    Breathing is overall doing well.    Dyspnea:  walking around store fine but overall very sedentary  Cough: worst first thing in am productive thick and white  Sleeping: never got bed blocks  SABA use: not needing for over a month  02: none Covid status:   none / never infected    No obvious day to day or daytime variability or assoc excess/ purulent sputum or mucus plugs or hemoptysis or cp or chest tightness, subjective wheeze or overt sinus or hb symptoms.    Also denies any obvious fluctuation of symptoms with weather or environmental changes or other aggravating or alleviating factors except as outlined above   No unusual exposure hx or h/o childhood pna/ asthma or knowledge of premature birth.  Current Allergies, Complete Past Medical History, Past Surgical History, Family History, and Social History were reviewed in [employer] record.  ROS  The following are not active complaints unless bolded Hoarseness, sore throat, dysphagia, dental problems, itching, sneezing,  nasal  congestion or discharge of excess mucus or purulent secretions, ear ache,   fever, chills, sweats, unintended wt loss or wt gain, classically pleuritic or exertional cp,  orthopnea pnd or arm/hand swelling  or leg swelling, presyncope, palpitations, abdominal pain, anorexia, nausea, vomiting, diarrhea  or change in bowel habits or change in bladder habits, change in stools or change in urine, dysuria, hematuria,  rash, arthralgias, visual complaints, headache, numbness, weakness or ataxia or problems with walking or coordination,  change in mood or  memory.        Current Meds - - NOTE:   Unable to verify as accurately reflecting what pt takes    Medication Sig   Ascorbic Acid (VITAMIN C PO) Take 1,000 mg by mouth daily.    ASPIRIN LOW DOSE 81 MG EC tablet TAKE 1  TABLET(81 MG) BY MOUTH DAILY   celecoxib (CELEBREX) 100 MG capsule TAKE 1 CAPSULE(100 MG) BY MOUTH TWICE DAILY   Cholecalciferol (VITAMIN D) 50 MCG (2000 UT) CAPS Take 1 capsule by mouth daily.   dextromethorphan-guaiFENesin (MUCINEX DM) 30-600 MG 12hr tablet Take 1 tablet by mouth 2 (two) times daily as needed for cough.   indapamide (LOZOL) 1.25 MG tablet TAKE 1 TABLET(1.25 MG) BY MOUTH DAILY   ipratropium-albuterol (DUONEB) 0.5-2.5 (3) MG/3ML SOLN Up to one every 4 hours as needed   irbesartan (AVAPRO) 150 MG tablet TAKE 1 TABLET(150 MG) BY MOUTH DAILY   Misc. Devices (ACAPELLA) MISC Use as needed.   potassium chloride (KLOR-CON) 10 MEQ tablet Take 1 tablet (10 mEq total) by mouth daily.              Objective:   Physical Exam   Wts  09/24/2021         192   07/27/2020     193 06/14/2019     190  09/13/2018       193  12/10/2017       191  09/10/2017       191  05/21/2017       185  12/28/2015       187  12/11/2015        187   11/22/15 191 lb 12.8 oz (87 kg)  11/11/15 184 lb 12.8 oz (83.825 kg)  11/10/15 187 lb (84.823 kg)    Vital signs reviewed  09/24/2021  - Note at rest 02 sats  97% on RA   General appearance:   amb wf congested cough     full top denture    HEENT : pt wearing mask not removed for exam due to covid -19 concerns.    NECK :  without JVD/Nodes/TM/ nl carotid upstrokes bilaterally   LUNGS: no acc muscle use,  Nl contour chest  pan exp rhonchi  bilaterally without cough on insp or exp maneuvers   CV:  RRR  no s3 or murmur or increase in P2, and no edema   ABD:  soft and nontender with nl inspiratory excursion in the supine position. No bruits or organomegaly appreciated, bowel sounds nl  MS:  Nl gait/ ext warm without deformities, calf tenderness, cyanosis or clubbing No obvious joint restrictions   SKIN: warm and dry without lesions    NEURO:  alert, approp, nl sensorium with  no motor or cerebellar deficits apparent.          Assessment &  Plan:

## 2021-09-24 NOTE — Assessment & Plan Note (Addendum)
Quit smoking 2015 11/22/2015   try symbicort 160 2bid  - 12/11/2015   try reducing to symb 80 2bid  - PFT's  12/28/2015  FEV1 1.95 (91 % ) ratio 85  p 6 % improvement from saba p no rx prior to study with DLCO  85 % corrects to 106 % for alv volume    - 09/13/2018  After extensive coaching inhaler device,  effectiveness =    75%   DDX of  difficult airways management almost all start with A and  include Adherence, Ace Inhibitors, Acid Reflux, Active Sinus Disease, Alpha 1 Antitripsin deficiency, Anxiety masquerading as Airways dz,  ABPA,  Allergy(esp in young), Aspiration (esp in elderly), Adverse effects of meds,  Active smoking or vaping, A bunch of PE's (a small clot burden can't cause this syndrome unless there is already severe underlying pulm or vascular dz with poor reserve) plus two Bs  = Bronchiectasis and Beta blocker use..and one C= CHF  Adherence is always the initial "prime suspect" and is a multilayered concern that requires a "trust but verify" approach in every patient - starting with knowing how to use medications, especially inhalers, correctly, keeping up with refills and understanding the fundamental difference between maintenance and prns vs those medications only taken for a very short course and then stopped and not refilled.  - med calendar reviewd separating maint vs prns  - - The proper method of use, as well as anticipated side effects, of a metered-dose inhaler were discussed and demonstrated to the patient using teach back method.  - return with all meds in hand using a trust but verify approach to confirm accurate Medication  Reconciliation The principal here is that until we are certain that the  patients are doing what we've asked, it makes no sense to ask them to do more.   ? Allergy/asthma component > Prednisone 10 mg take  4 each am x 2 days,   2 each am x 2 days,  1 each am x 2 days and stop and continue symbicort 80 2bid   ? Acid (or non-acid) GERD > always difficult to  exclude as up to 75% of pts in some series report no assoc GI/ Heartburn symptoms> rec  Noct  acid suppression with H2 for now  and diet restrictions/ reviewed and instructions given in writing.   ? Alpha one def > unlikely with fev1 so well preserved.  ? Bronchiectasis > flutter/ mucinex max doses should cover, consider HRCT to eval next ov    F/u in 4 weeks, call sooner if needed          Each maintenance medication was reviewed in detail including emphasizing most importantly the difference between maintenance and prns and under what circumstances the prns are to be triggered using an action plan format where appropriate.  Total time for H and P, chart review, counseling, reviewing hfa/neb/ flutter  device(s) and generating customized AVS unique to this office visit / same day charting > 30 min

## 2021-11-12 ENCOUNTER — Ambulatory Visit: Payer: Medicare Other | Admitting: Internal Medicine

## 2021-11-12 ENCOUNTER — Encounter: Payer: Self-pay | Admitting: Internal Medicine

## 2021-11-12 ENCOUNTER — Other Ambulatory Visit: Payer: Self-pay

## 2021-11-12 DIAGNOSIS — J449 Chronic obstructive pulmonary disease, unspecified: Secondary | ICD-10-CM | POA: Diagnosis not present

## 2021-11-12 MED ORDER — ALBUTEROL SULFATE HFA 108 (90 BASE) MCG/ACT IN AERS: INHALATION_SPRAY | RESPIRATORY_TRACT | 11 refills | Status: AC

## 2021-11-12 NOTE — Patient Instructions (Addendum)
Plan A = Automatic = Always=  Symbicort 80 Take 2 puffs first thing in am and then another 2 puffs about 12 hours later.  ? (When it's red it's dead)  ? ?Work on inhaler technique:  relax and gently blow all the way out then take a nice smooth full deep breath back in, triggering the inhaler at same time you start breathing in.  Hold for up to 5 seconds if you can. Blow out thru nose. Rinse and gargle with water when done.  If mouth or throat bother you at all,  try brushing teeth/gums/tongue with arm and hammer toothpaste/ make a slurry and gargle and spit out.  ? ?- use the empty canister to practice  ? ?Plan B = Backup (to supplement plan A, not to replace it) ?Only use your albuterol inhaler as a rescue medication to be used if you can't catch your breath by resting or doing a relaxed purse lip breathing pattern.  ?- The less you use it, the better it will work when you need it. ?- Ok to use the inhaler up to 2 puffs  every 4 hours if you must but call for appointment if use goes up over your usual need ?- Don't leave home without it !!  (think of it like the spare tire for your car)  ? ?Plan C = Crisis (instead of Plan B but only if Plan B stops working) ?- only use your albuterol- duoneb nebulizer if you first try Plan B and it fails to help > ok to use the nebulizer up to every 4 hours but if start needing it regularly call for immediate appointment ? ? ?Plan D = Doctor ?- call me if B and C not adequate ? ?Please schedule a follow up visit in 3 months but call sooner if needed - bring inhalers with you  ? ?  ?

## 2021-11-12 NOTE — Assessment & Plan Note (Signed)
Quit smoking 2015 ?11/22/2015   try symbicort 160 2bid  ?- 12/11/2015   try reducing to symb 80 2bid  ?- PFT's  12/28/2015  FEV1 1.95 (91 % ) ratio 85  p 6 % improvement from saba p no rx prior to study with DLCO  85 % corrects to 106 % for alv volume   ? - 09/13/2018  After extensive coaching inhaler device,  effectiveness =    75%  ? ?All goals of chronic asthma control met including optimal function and elimination of symptoms with minimal need for rescue therapy on symbicort 80 2bid ? ?Contingencies discussed in full including contacting this office immediately if not controlling the symptoms using the rule of two's.  ABCD plan generated/ reviewed ? ?- The proper method of use, as well as anticipated side effects, of a metered-dose inhaler were discussed and demonstrated to the patient using teach back method.  ? ?F/u q 3 m until assure med reconcilation/ adherence. ? ?    ?  ? ?Each maintenance medication was reviewed in detail including emphasizing most importantly the difference between maintenance and prns and under what circumstances the prns are to be triggered using an action plan format where appropriate. ? ?Total time for H and P, chart review, counseling, reviewing hfa/neb device(s) and generating customized AVS unique to this office visit / same day charting = 22 min  ?     ?

## 2021-11-12 NOTE — Progress Notes (Signed)
? ?Subjective:  ? ? Patient ID: Shelley Perez, female    DOB: 05/13/1951    MRN: [id number] ? ?  ?Brief patient profile:  ?71   yowf quit smoking 2015 with some sensation of midline chest discomfort brought on by smoking that resolved @ 172lb p quit smoking with no breathing problems though tendency for colds going to chest and persistent sore throat since ET 11/22/116 for R knee then  worst episode middle of Feb 2017 >  sev ov's rx as flu with tamiflu / pred/ abx/ only some better so referred to pulmonary clinic 11/22/2015 by Dr  Philis Fendt at [facility] ? ? ?History of Present Illness  ?11/22/2015 1st [facility] office visit/ Ernestina Joe   ?Chief Complaint  ?Patient presents with  ? Pulmonary Consult  ?  Referred by Dr. Philis Fendt. Pt c/o cough and SOB since she was dxed with Influenza in Feb 2017. Cough is prod with yellow sputum.    ?rx with doxy and prednisone x 4 days and qvar not better. Cough is worse at hs and in am and doe x more than slow walk on flat level = MMRC2 ?Having to use neb saba qid whereas prior to feb 2017 was not using it at all and prev on no maint rx  ?rec ?Plan A = Automatic =  symbicort 160 Take 2 puffs first thing in am and then another 2 puffs about 12 hours later.  ?Plan B = Backup ? ok to use the nebulizer up to every 4 hours but if start needing it regularly call for immediate appointment ?GERD diet ?   ? ? ?09/24/2021  f/u ov/Kelli Robeck re: COPD GOLD 0  maint on nothing x one month and more am congestion since quit  but no more need for saba  ?Chief Complaint  ?Patient presents with  ? Follow-up  ?  Breathing is overall doing well.   ?Dyspnea:  walking around store fine but overall very sedentary  ?Cough: worst first thing in am productive thick and white  ?Sleeping: never got bed blocks  ?SABA use: not needing for over a month  ?02: none ?Covid status:   none / never infected  ?Rec ?Plan A = Automatic = Always=    Symbicort 80 Take 2 puffs first thing in am and then another 2 puffs about 12  hours later.  ?Work on inhaler technique:  ?Keep the empty cannister  ?Plan B = Backup (to supplement plan A, not to replace it) ?Only use your albuterol - ipatropium as a rescue medication ?For cough/ congestion take mucinex dm 1200 mg every 12 hours as needed with flutter as much as possible  ?Prednisone 10 mg take  4 each am x 2 days,   2 each am x 2 days,  1 each am x 2 days and stop  ?Pepcid 20 mg one at bedtime  ?GERD diet reviewed, bed blocks rec  ?Please schedule a follow up office visit in 6 weeks, call sooner if needed with all medications /inhalers/ solutions in hand   ? ? ? ?11/12/2021  f/u ov/Fatina Sprankle re: copd 0/AB maint on symbicort 80  2 bid though not historically compliant and again forgot to bring any meds/inhalers with her ?Chief Complaint  ?Patient presents with  ? Follow-up  ?  6-7 wk f/u for COPD. States she has been doing well since last visit.   ?Dyspnea:  no regular walks limited by Left knee ?Cough: better now  ?Sleeping: flat bed 1 pillow ?  SABA use: not using neb ?02: none  ?Covid status:   never vax, never infected  ? ? ?No obvious day to day or daytime variability or assoc excess/ purulent sputum or mucus plugs or hemoptysis or cp or chest tightness, subjective wheeze or overt sinus or hb symptoms.  ? ?Sleeping  without nocturnal  or early am exacerbation  of respiratory  c/o's or need for noct saba. Also denies any obvious fluctuation of symptoms with weather or environmental changes or other aggravating or alleviating factors except as outlined above  ? ?No unusual exposure hx or h/o childhood pna/ asthma or knowledge of premature birth. ? ?Current Allergies, Complete Past Medical History, Past Surgical History, Family History, and Social History were reviewed in [employer] record. ? ?ROS  The following are not active complaints unless bolded ?Hoarseness, sore throat, dysphagia, dental problems, itching, sneezing,  nasal congestion or discharge of excess mucus or  purulent secretions, ear ache,   fever, chills, sweats, unintended wt loss or wt gain, classically pleuritic or exertional cp,  orthopnea pnd or arm/hand swelling  or leg swelling, presyncope, palpitations, abdominal pain, anorexia, nausea, vomiting, diarrhea  or change in bowel habits or change in bladder habits, change in stools or change in urine, dysuria, hematuria,  rash, arthralgias, visual complaints, headache, numbness, weakness or ataxia or problems with walking or coordination,  change in mood or  memory. ?      ? ?Current Meds  ?Medication Sig  ? Ascorbic Acid (VITAMIN C PO) Take 1,000 mg by mouth daily.   ? ASPIRIN LOW DOSE 81 MG EC tablet TAKE 1 TABLET(81 MG) BY MOUTH DAILY  ? budesonide-formoterol (SYMBICORT) 80-4.5 MCG/ACT inhaler Take 2 puffs first thing in am and then another 2 puffs about 12 hours later.  ? celecoxib (CELEBREX) 100 MG capsule TAKE 1 CAPSULE(100 MG) BY MOUTH TWICE DAILY  ? Cholecalciferol (VITAMIN D) 50 MCG (2000 UT) CAPS Take 1 capsule by mouth daily.  ? dextromethorphan-guaiFENesin (MUCINEX DM) 30-600 MG 12hr tablet Take 1 tablet by mouth 2 (two) times daily as needed for cough.  ? famotidine (PEPCID) 20 MG tablet One after supper  ? indapamide (LOZOL) 1.25 MG tablet TAKE 1 TABLET(1.25 MG) BY MOUTH DAILY  ? ipratropium-albuterol (DUONEB) 0.5-2.5 (3) MG/3ML SOLN Up to one every 4 hours as needed  ? irbesartan (AVAPRO) 150 MG tablet TAKE 1 TABLET(150 MG) BY MOUTH DAILY  ? Misc. Devices (ACAPELLA) MISC Use as needed.  ? potassium chloride (KLOR-CON) 10 MEQ tablet Take 1 tablet (10 mEq total) by mouth daily.  ?    ? ?  ? ? ?   ?Objective:  ? Physical Exam ? ? Wts ? ?11/12/2021       197   ?09/24/2021         192   ?07/27/2020     193 ?06/14/2019     190  ?09/13/2018       193  ?12/10/2017       191  ?09/10/2017       191  ?05/21/2017       185  ?12/28/2015       187  ?12/11/2015        187   ?11/22/15 191 lb 12.8 oz (87 kg)  ?11/11/15 184 lb 12.8 oz (83.825 kg)  ?11/10/15 187 lb (84.823 kg)  ?   ?Vital signs reviewed  11/12/2021  - Note at rest 02 sats  98% on RA  ? ?General appearance:  amb slt hoarse wf nad ? ? HEENT : nl exam x full top denture  ? ? ?NECK :  without JVD/Nodes/TM/ nl carotid upstrokes bilaterally ? ? ?LUNGS: no acc muscle use,  Nl contour chest which is clear to A and P bilaterally without cough on insp or exp maneuvers ? ? ?CV:  RRR  no s3 or murmur or increase in P2, and no edema  ? ?ABD:  soft and nontender with nl inspiratory excursion in the supine position. No bruits or organomegaly appreciated, bowel sounds nl ? ?MS:  Nl gait/ ext warm without deformities, calf tenderness, cyanosis or clubbing ?No obvious joint restrictions  ? ?SKIN: warm and dry without lesions   ? ?NEURO:  alert, approp, nl sensorium with  no motor or cerebellar deficits apparent.   ?  ?  ?  ?   ?Assessment & Plan:  ?  ?  ?

## 2021-11-19 ENCOUNTER — Emergency Department (INDEPENDENT_AMBULATORY_CARE_PROVIDER_SITE_OTHER)
Admission: EM | Admit: 2021-11-19 | Discharge: 2021-11-19 | Disposition: A | Discharge status: Home / Self Care | Payer: Medicare Other | Source: Home / Self Care

## 2021-11-19 ENCOUNTER — Emergency Department (INDEPENDENT_AMBULATORY_CARE_PROVIDER_SITE_OTHER): Payer: Medicare Other

## 2021-11-19 DIAGNOSIS — M25531 Pain in right wrist: Secondary | ICD-10-CM

## 2021-11-19 DIAGNOSIS — S63501A Unspecified sprain of right wrist, initial encounter: Secondary | ICD-10-CM

## 2021-11-19 MED ORDER — ACETAMINOPHEN 500 MG PO TABS
1000.0000 mg | ORAL_TABLET | Freq: Once | ORAL | Status: AC
  Administered 2021-11-19: 1000 mg via ORAL

## 2021-11-19 MED ORDER — METHYLPREDNISOLONE 4 MG PO TBPK: ORAL_TABLET | ORAL | 0 refills | Status: DC

## 2021-11-19 NOTE — ED Provider Notes (Signed)
?[place] ? ? ? ?CSN: [id number] ?Arrival date & time: 11/19/21  1620 ? ? ?  ? ?History   ?Chief Complaint ?Chief Complaint  ?Patient presents with  ? Wrist Pain  ?  RT  ? ? ?HPI ?Shelley Perez is a 71 y.o. female.  ? ?HPI 88-year-old female presents with right wrist pain since last night.  Reports she was picking up her dog and felt immediate pain in her right wrist at the base of her thumb now radiate up her forearm.  Patient reports right wrist pain as 10 of 10.  PMH significant for osteoporosis, vitamin D deficiency, and COPD. ? ?Past Medical History:  ?Diagnosis Date  ? Anemia   ? Anxiety   ? Arthritis   ? Asthma   ? Cataract   ? Complication of anesthesia   ? "they gave me too much and I had to stay 3 days " 2005 after choley   ? COPD (chronic obstructive pulmonary disease) ([place])   ? Depression   ? Fibromyalgia   ? GERD (gastroesophageal reflux disease)   ? Hematuria   ? History of kidney stones   ? Hx of pancreatitis   ? with gallstones  ? Hyperlipidemia   ? Hypertension   ? Insomnia   ? Obesity   ? Osteoporosis   ? Overactive bladder   ? Thrombocythemia   ? Vitamin D deficiency   ? ? ?Patient Active Problem List  ? Diagnosis Date Noted  ? Statin intolerance 02/22/2020  ? Visit for screening mammogram 02/15/2020  ? Bilateral hearing loss 02/15/2020  ? Essential hypertension 05/25/2019  ? Hyperglycemia 02/22/2019  ? OAB (overactive bladder) 02/22/2019  ? COPD GOLD 0/ AB 09/15/2018  ? Mastodynia of right breast 04/28/2018  ? Asthma 04/09/2018  ? Allergic rhinitis 04/09/2018  ? Routine general medical examination at a health care facility 02/17/2018  ? Atherosclerosis of aorta ([place]) 02/16/2018  ? Chronic calcific pancreatitis ([place]) 01/01/2018  ? Urinary anomaly 04/14/2017  ? Chronic idiopathic constipation 01/21/2017  ? Hyperlipidemia LDL goal <130 01/21/2017  ? Thrombocytosis 01/21/2017  ? Gastroesophageal reflux disease with esophagitis 01/29/2016  ? Osteoporosis 01/29/2016  ? Vitamin D deficiency  01/29/2016  ? Menopause ovarian failure 01/29/2016  ? Upper airway cough syndrome 12/12/2015  ? Abnormal CT of the chest 11/22/2015  ? COPD with acute exacerbation ([place]) 11/21/2015  ? Class 2 obesity with body mass index (BMI) of 36.0 to 36.9 in adult 07/11/2015  ? Depression with anxiety 04/19/2011  ? ? ?Past Surgical History:  ?Procedure Laterality Date  ? c sections    ? CHOLECYSTECTOMY    ? DENTAL SURGERY    ? FOOT SURGERY    ? [place] / 1971  ? SHOULDER SURGERY    ? TOTAL KNEE ARTHROPLASTY Right 07/10/2015  ? Procedure: RIGHT TOTAL KNEE ARTHROPLASTY;  Surgeon: Paralee Cancel, MD;  Location: [facility];  Service: Orthopedics;  Laterality: Right;  ? ? ?OB History   ?No obstetric history on file. ?  ? ? ? ?Home Medications   ? ?Prior to Admission medications   ?Medication Sig Start Date End Date Taking? Authorizing Provider  ?methylPREDNISolone (MEDROL DOSEPAK) 4 MG TBPK tablet Take as directed 11/19/21  Yes Eliezer Lofts, FNP  ?albuterol (PROAIR HFA) 108 (90 Base) MCG/ACT inhaler 2 puffs every 4 hours as needed only  if your can't catch your breath 11/12/21   Tanda Rockers, MD  ?Ascorbic Acid (VITAMIN C PO) Take 1,000 mg by  mouth daily.     [provider]  ?ASPIRIN LOW DOSE 81 MG EC tablet TAKE 1 TABLET(81 MG) BY MOUTH DAILY 08/19/20   Janith Lima, MD  ?budesonide-formoterol [facility]) 80-4.5 MCG/ACT inhaler Take 2 puffs first thing in am and then another 2 puffs about 12 hours later. 09/24/21   Tanda Rockers, MD  ?celecoxib (CELEBREX) 100 MG capsule TAKE 1 CAPSULE(100 MG) BY MOUTH TWICE DAILY 05/16/20   Janith Lima, MD  ?Cholecalciferol (VITAMIN D) 50 MCG (2000 UT) CAPS Take 1 capsule by mouth daily.    [provider]  ?dextromethorphan-guaiFENesin (MUCINEX DM) 30-600 MG 12hr tablet Take 1 tablet by mouth 2 (two) times daily as needed for cough.    [provider]  ?famotidine (PEPCID) 20 MG tablet One after supper 09/24/21   Tanda Rockers, MD  ?indapamide (LOZOL) 1.25 MG  tablet TAKE 1 TABLET(1.25 MG) BY MOUTH DAILY 12/08/20   Janith Lima, MD  ?ipratropium-albuterol (DUONEB) 0.5-2.5 (3) MG/3ML SOLN Up to one every 4 hours as needed 05/29/20   Tanda Rockers, MD  ?irbesartan (AVAPRO) 150 MG tablet TAKE 1 TABLET(150 MG) BY MOUTH DAILY 08/20/20   Janith Lima, MD  ?Misc. Devices (ACAPELLA) MISC Use as needed.    [provider]  ?potassium chloride (KLOR-CON) 10 MEQ tablet Take 1 tablet (10 mEq total) by mouth daily. 07/10/20   Janith Lima, MD  ? ? ?Family History ?Family History  ?Problem Relation Age of Onset  ? Hyperlipidemia Mother   ? Hypertension Mother   ? Heart attack Mother   ? Heart disease Mother   ? Cancer Father   ? Asthma Sister   ? Lung cancer Sister   ?     smoked  ? Colon cancer Neg Hx   ? ? ?Social History ?Social History  ? ?Tobacco Use  ? Smoking status: Former  ?  Packs/day: 1.00  ?  Years: 40.00  ?  Pack years: 40.00  ?  Types: Cigarettes  ?  Quit date: 06/28/2013  ?  Years since quitting: 8.4  ? Smokeless tobacco: Never  ?Vaping Use  ? Vaping Use: Never used  ?Substance Use Topics  ? Alcohol use: No  ?  Alcohol/week: 0.0 standard drinks  ? Drug use: No  ? ? ? ?Allergies   ?Linzess [linaclotide], Myrbetriq [mirabegron], Rosuvastatin calcium, Atorvastatin, Codeine, Crestor [rosuvastatin], Prozac [fluoxetine hcl], Latex, and Sulfa antibiotics ? ? ?Review of Systems ?Review of Systems  ?Musculoskeletal:   ?     Right wrist pain x1 day  ?All other systems reviewed and are negative. ? ? ?Physical Exam ?Triage Vital Signs ?ED Triage Vitals  ?Enc Vitals Group  ?   BP 11/19/21 1717 (!) 168/84  ?   Pulse Rate 11/19/21 1717 75  ?   Resp 11/19/21 1717 18  ?   Temp 11/19/21 1717 98 ?F (36.7 ?C)  ?   Temp Source 11/19/21 1717 Oral  ?   SpO2 11/19/21 1717 97 %  ?   Weight --   ?   Height --   ?   Head Circumference --   ?   Peak Flow --   ?   Pain Score 11/19/21 1715 10  ?   Pain Loc --   ?   Pain Edu? --   ?   Excl. in [place]? --   ? ?No data found. ? ?Updated  Vital Signs ?BP (!) 168/84 (BP Location: Left Arm)  Pulse 75   Temp 98 ?F (36.7 ?C) (Oral)   Resp 18   SpO2 97%  ? ? ?Physical Exam ?Vitals and nursing note reviewed.  ?Constitutional:   ?   General: She is not in acute distress. ?   Appearance: Normal appearance. She is obese. She is not ill-appearing.  ?HENT:  ?   Head: Normocephalic and atraumatic.  ?   Mouth/Throat:  ?   Mouth: Mucous membranes are moist.  ?   Pharynx: Oropharynx is clear.  ?Eyes:  ?   Extraocular Movements: Extraocular movements intact.  ?   Conjunctiva/sclera: Conjunctivae normal.  ?   Pupils: Pupils are equal, round, and reactive to light.  ?Cardiovascular:  ?   Rate and Rhythm: Normal rate and regular rhythm.  ?   Pulses: Normal pulses.  ?   Heart sounds: Normal heart sounds.  ?Pulmonary:  ?   Effort: Pulmonary effort is normal.  ?   Breath sounds: Normal breath sounds. No wheezing, rhonchi or rales.  ?Musculoskeletal:  ?   Cervical back: Normal range of motion and neck supple.  ?   Comments: Right wrist (volar aspect): TTP with moderate soft tissue swelling noted, LROM flexion/extension  ?Skin: ?   General: Skin is warm and dry.  ?Neurological:  ?   General: No focal deficit present.  ?   Mental Status: She is alert and oriented to person, place, and time.  ? ? ? ?UC Treatments / Results  ?Labs ?(all labs ordered are listed, but only abnormal results are displayed) ?Labs Reviewed - No data to display ? ?EKG ? ? ?Radiology ?DG Wrist Complete Right ? ?Result Date: 11/19/2021 ?CLINICAL DATA:  Injury. EXAM: RIGHT WRIST - COMPLETE 3+ VIEW COMPARISON:  Right hand x-ray 08/26/2017. FINDINGS: There is no evidence of fracture or dislocation. There is no evidence of arthropathy or other focal bone abnormality. There is soft tissue swelling surrounding the wrist. IMPRESSION: No acute bony abnormality. Electronically Signed   By: Ronney Asters M.D.   On: 11/19/2021 17:41   ? ?Procedures ?Procedures (including critical care time) ? ?Medications  Ordered in UC ?Medications  ?acetaminophen (TYLENOL) tablet 1,000 mg (1,000 mg Oral Given 11/19/21 1835)  ? ? ?Initial Impression / Assessment and Plan / UC Course  ?I have reviewed the triage vital signs and the

## 2021-11-19 NOTE — ED Triage Notes (Signed)
Pt here today c/o RT wrist pain since last night. Says she was picking up her dog when she felt an immediate pain in her wrist at the base of her thumb. Pain now radiating up forearm. Swelling noted. Pain 10/10 Hx of arthritis and osteoporosis.  ?

## 2021-11-19 NOTE — Discharge Instructions (Addendum)
Advised/informed patient of right wrist x-ray results with hard copy provided to patient.  Advised patient to take medication as directed with food to completion.  Advised patient to wear right wrist brace 24/7 (except when bathing) for the next 2 weeks.  Advised patient if right wrist pain worsens and/or unresolved please follow-up with [facility] orthopedic provider (contact information provided above with this AVS) for further evaluation. ?

## 2021-11-21 ENCOUNTER — Ambulatory Visit: Payer: Medicare Other | Admitting: Family Medicine

## 2021-11-21 ENCOUNTER — Ambulatory Visit: Payer: Self-pay

## 2021-11-21 VITALS — BP 158/90 | HR 80 | Ht 62.0 in | Wt 196.2 lb

## 2021-11-21 DIAGNOSIS — M25531 Pain in right wrist: Secondary | ICD-10-CM | POA: Diagnosis not present

## 2021-11-21 MED ORDER — TRAMADOL HCL 50 MG PO TABS: 50.0000 mg | ORAL_TABLET | Freq: Three times a day (TID) | ORAL | 0 refills | Status: DC | PRN

## 2021-11-21 NOTE — Progress Notes (Signed)
? ?I, Shelley Perez, LAT, ATC acting as a scribe for Shelley Leader, MD. ? ?Subjective:   ? ?CC: R wrist pain ? ?HPI: Pt is a 71 y/o female c/o R wrist pain ongoing since 4/3. MOI: Pt was picking up her dog and felt immediate pain in her R wrist at the base of the 1st [facility] and into her forearm Pt was seen at the [facility] on 11/19/21 for this issue, dx w/ a wrist sprain, and given a wrist brace and prescribed medrol dosepak. Today, pt reports pain is all over wrist and radiating up to L elbow and into all fingers. ? ?R hand swelling: swelling ?Numbness/tingling: no ?Aggravates: using hand/fingers ?Treatments: brace, prednisone, ice, Tylenol ? ?Dx imaging: 11/19/21 R wrist XR ? 08/26/17 R hand XR ? ? ?Pertinent review of Systems: No fevers or chills ? ?Relevant historical information: Hypertension.  COPD.  History of pancreatitis. ? ? ?Objective:   ? ?Vitals:  ? 11/21/21 1416  ?BP: (!) 158/90  ?Pulse: 80  ?SpO2: 98%  ? ?General: Well Developed, well nourished, and in no acute distress.  ? ?MSK: Right wrist: Significant swelling along the dorsal and volar wrist. ?Tender palpation dorsal radial and volar wrist. ?Decreased wrist motion. ?Pain with resisted wrist extension flexion radial and ulnar deviation. ?Pulses cap refill and sensation are intact distally. ? ?Lab and Radiology Results ? ?Diagnostic Limited MSK Ultrasound of: Right wrist ?Dorsal wrist no severe tenosynovitis of dorsal wrist compartments 1 2 and 3. ?Significant hypoechoic fluid collects within dorsal wrist compartment #4. ?Moderate dorsal wrist effusion is also present. ?No tenosynovitis of dorsal wrist compartment 5 and 6 ?Mild hypoechoic fluid collects within the carpal tunnel indicating tenosynovitis of the flexor tendons as well. ?No acute fractures are visible on ultrasound examination of the rest ?Impression: Wrist effusion and tenosynovitis of the fourth dorsal wrist compartment are dominant findings. ? ? ?No results found for this or any  previous visit (from the past 72 hour(s)). ?DG Wrist Complete Right ? ?Result Date: 11/19/2021 ?CLINICAL DATA:  Injury. EXAM: RIGHT WRIST - COMPLETE 3+ VIEW COMPARISON:  Right hand x-ray 08/26/2017. FINDINGS: There is no evidence of fracture or dislocation. There is no evidence of arthropathy or other focal bone abnormality. There is soft tissue swelling surrounding the wrist. IMPRESSION: No acute bony abnormality. Electronically Signed   By: Ronney Asters M.D.   On: 11/19/2021 17:41   ?I, Shelley Perez, personally (independently) visualized and performed the interpretation of the images attached in this note. ? ? ? ?Impression and Recommendations:   ? ?Assessment and Plan: ?71 y.o. female with right wrist pain and swelling after picking her dog up.  Patient has severe wrist pain associated with swelling.  She describes a pop while lifting her dog and I am concerned may have a radiographically occult fracture or ligament injury or tendon tear that is not well appreciated on exam today.  She has fluid collecting within the fourth dorsal wrist compartment and in the dorsal wrist joint on ultrasound examination that supports a more significant injury than a simple wrist strain.  She is currently treated with a thumb spica splint which I think is appropriate.  Plan to continue wrist splint and prescribed tramadol for pain control and recheck in 1 week.  If not significantly improved at that time would recommend MRI arthrogram to further evaluate the source of pain and injury.. ? ?PDMP reviewed during this encounter. ?Orders Placed This Encounter  ?Procedures  ? [place] LIMITED JOINT SPACE  STRUCTURES UP RIGHT(NO LINKED CHARGES)  ?  Order Specific Question:   Reason for Exam (SYMPTOM  OR DIAGNOSIS REQUIRED)  ?  Answer:   right wrist pain  ?  Order Specific Question:   Preferred imaging location?  ?  Answer:   [place]  ? ?Meds ordered this encounter  ?Medications  ? traMADol (ULTRAM) 50 MG tablet  ?   Sig: Take 1 tablet (50 mg total) by mouth every 8 (eight) hours as needed for severe pain.  ?  Dispense:  15 tablet  ?  Refill:  0  ? ? ?Discussed warning signs or symptoms. Please see discharge instructions. Patient expresses understanding. ? ? ?The above documentation has been reviewed and is accurate and complete Shelley Perez, M.D. ? ?

## 2021-11-21 NOTE — Patient Instructions (Addendum)
Thank you for coming in today.  ? ?Recheck in 1 week.  ? ?Use the splint.  ? ?Use the tramadol for pain.  ? ? ?

## 2021-11-28 ENCOUNTER — Ambulatory Visit: Payer: Medicare Other | Admitting: Family Medicine

## 2021-11-28 ENCOUNTER — Ambulatory Visit (INDEPENDENT_AMBULATORY_CARE_PROVIDER_SITE_OTHER): Payer: Medicare Other

## 2021-11-28 VITALS — BP 148/92 | HR 79 | Ht 62.0 in | Wt 196.6 lb

## 2021-11-28 DIAGNOSIS — G8929 Other chronic pain: Secondary | ICD-10-CM | POA: Diagnosis not present

## 2021-11-28 DIAGNOSIS — M25562 Pain in left knee: Secondary | ICD-10-CM

## 2021-11-28 DIAGNOSIS — M25531 Pain in right wrist: Secondary | ICD-10-CM

## 2021-11-28 NOTE — Patient Instructions (Addendum)
Thank you for coming in today.  ? ?Glad you are making some progress and feeling better! ? ?Please get an Xray today before you leave   ? ?Recheck back 2 weeks ?

## 2021-11-28 NOTE — Progress Notes (Signed)
? ?I, Peterson Lombard, LAT, ATC acting as a scribe for Lynne Leader, MD. ? ?Shelley Perez is a 71 y.o. female who presents to [place] at [facility] today for f/u R wrist pain ongoing since 4/3 when she was picking up her dog and felt immediate pain in her R wrist at the base of the 1st [facility] and into her forearm Pt was seen at the [facility] on 11/19/21 for this issue. Pt was last seen by Dr. Georgina Snell on 11/21/21 and was advised to cont wearing the thumb spica splint and was prescribed tramadol. Today, pt reports some improvement in her R wrist pain. She has been able to move her fingers. Without wrist brace, pt reports pain along the radial aspect w/ thumb movements. Pt was able to bathe using both hands today, which she was excited about that progress. Pt has been compliant in wearing the wrist brace. Pt reports upset stomach w/ pain rx.  ? ?She also notes some left posterior lateral knee pain. ? ?Dx imaging: 11/19/21 R wrist XR ?            08/26/17 R hand XR ? ?Pertinent review of systems: No fevers or chills ? ?Relevant historical information: Chronic calcific pancreatitis history.  Hypertension history ? ? ?Exam:  ?BP (!) 148/92   Pulse 79   Ht '5\' 2"'$  (1.575 m)   Wt 196 lb 9.6 oz (89.2 kg)   SpO2 97%   BMI 35.96 kg/m?  ?General: Well Developed, well nourished, and in no acute distress.  ? ?MSK: Right wrist: Still some swelling at the radial styloid along the dorsal wrist. ?Decreased wrist motion. ?Tender palpation at radial styloid and dorsal wrist. ?Positive Finkelstein's test. ? ?Left knee: Decreased range of motion.  Tender palpation posterior lateral knee. ?Stable ligamentous exam.  Intact strength. ? ?Lab and Radiology Results ? ?X-ray images right wrist and left knee obtained today personally and independently interpreted ? ?Right wrist: No acute fractures are visible.  Degenerative changes present at the base of the thumb. ? ?Left knee: Significant tricompartmental DJD.  No acute  fractures are visible. ? ?Await formal radiology review ? ? ?Assessment and Plan: ?71 y.o. female with right wrist pain.  Etiology is unclear.  She felt a pop and developed pain and swelling while lifting her dog on April 3.  This would indicate that she has some sort of injury in her wrist.  However the x-ray on the initial day of injury and subsequent follow-up x-ray today per my read do not show fracture.  Ultrasound initially did not show tendon tear.  She is improving with immobilization and oral steroids.  Plan to continue immobilization with thumb spica splint/brace for another 2 weeks and recheck.  If not improved at that point would recommend MRI arthrogram. ? ?Left knee pain and swelling due to significant DJD.  Recheck in 2 weeks consider steroid injection if needed. ? ? ?PDMP not reviewed this encounter. ?Orders Placed This Encounter  ?Procedures  ? DG Wrist Complete Right  ?  Standing Status:   Future  ?  Number of Occurrences:   1  ?  Standing Expiration Date:   12/28/2021  ?  Order Specific Question:   Reason for Exam (SYMPTOM  OR DIAGNOSIS REQUIRED)  ?  Answer:   rigth wrist pain  ?  Order Specific Question:   Preferred imaging location?  ?  Answer:   [facility]  ? DG Knee AP/LAT W/[place] Left  ?  Standing Status:   Future  ?  Number of Occurrences:   1  ?  Standing Expiration Date:   12/28/2021  ?  Order Specific Question:   Reason for Exam (SYMPTOM  OR DIAGNOSIS REQUIRED)  ?  Answer:   left knee pain  ?  Order Specific Question:   Preferred imaging location?  ?  Answer:   [facility]  ? ?No orders of the defined types were placed in this encounter. ? ? ? ?Discussed warning signs or symptoms. Please see discharge instructions. Patient expresses understanding. ? ? ?The above documentation has been reviewed and is accurate and complete Lynne Leader, M.D. ? ? ?

## 2021-11-29 NOTE — Progress Notes (Signed)
Left knee x-ray shows severe arthritis changes.

## 2021-11-29 NOTE — Progress Notes (Signed)
Right wrist x-ray shows arthritis changes.  No fractures are present.

## 2021-12-12 ENCOUNTER — Ambulatory Visit: Payer: Self-pay

## 2021-12-12 ENCOUNTER — Ambulatory Visit: Payer: Medicare Other | Admitting: Family Medicine

## 2021-12-12 ENCOUNTER — Encounter: Payer: Self-pay | Admitting: Family Medicine

## 2021-12-12 VITALS — BP 132/80 | HR 77 | Ht 62.0 in | Wt 197.2 lb

## 2021-12-12 DIAGNOSIS — M25562 Pain in left knee: Secondary | ICD-10-CM | POA: Diagnosis not present

## 2021-12-12 DIAGNOSIS — G8929 Other chronic pain: Secondary | ICD-10-CM

## 2021-12-12 NOTE — Progress Notes (Signed)
? ?I, Wendy Poet, LAT, ATC, am serving as scribe for Dr. Lynne Leader. ? ?Shelley Perez is a 71 y.o. female who presents to [place] at [facility] today for for f/u of R wrist pain since 11/18/21 when she was picking up her dog and felt immediate pain in her R wrist at the base of the 1st [facility] and into her forearm. Pt was seen at the [facility] on 11/19/21 for this issue. She was last seen by Dr. Georgina Snell on 11/28/21 and noted improvement in her wrist.  She was advised to continue immobilization with thumb spica splint/brace for another 2 weeks and recheck.  She also reported L knee pain due to significant DJD.  Today, pt reports that her R wrist is doing well.  Her L knee is bothering her.  She locates her pain from her L post-lat knee that radiates into her L post-lat thigh.  Aggravating factors include standing after prolonged sitting.  She notes that her L knee is swollen. ? ?Dx imaging: L knee and R wrist XR- 11/28/21 ?11/19/21 R wrist XR ?            08/26/17 R hand XR ?Pertinent review of systems: no fever or chills ? ?Relevant historical information: Hypertension ? ? ?Exam:  ?BP 132/80 (BP Location: Left Arm, Patient Position: Sitting, Cuff Size: Normal)   Pulse 77   Ht '5\' 2"'$  (1.575 m)   Wt 197 lb 3.2 oz (89.4 kg)   SpO2 94%   BMI 36.07 kg/m?  ?General: Well Developed, well nourished, and in no acute distress.  ? ?MSK: Left knee: Tender palpation posterior lateral left knee. ?Decreased range of motion. ?Stable ligamentous exam. ?Positive lateral McMurray's test. ?Intact strength. ? ? ? ?Lab and Radiology Results ? ?Procedure: Real-time Ultrasound Guided Injection of left knee superior lateral patellar space ?Device: Philips Affiniti 50G ?Images permanently stored and available for review in PACS ?Verbal informed consent obtained.  Discussed risks and benefits of procedure. Warned about infection, bleeding, hyperglycemia damage to structures among others. ?Patient expresses understanding  and agreement ?Time-out conducted.   ?Noted no overlying erythema, induration, or other signs of local infection.   ?Skin prepped in a sterile fashion.   ?Local anesthesia: Topical Ethyl chloride.   ?With sterile technique and under real time ultrasound guidance: 40 mg of Kenalog and 2 mL of Marcaine injected into knee joint. Fluid seen entering the joint capsule.   ?Completed without difficulty   ?Pain immediately resolved suggesting accurate placement of the medication.   ?Advised to call if fevers/chills, erythema, induration, drainage, or persistent bleeding.   ?Images permanently stored and available for review in the ultrasound unit.  ?Impression: Technically successful ultrasound guided injection. ? ? ?EXAM: ?LEFT KNEE 3 VIEWS ?  ?COMPARISON:  Left tibia and fibula radiographs 11/10/2017 ?  ?FINDINGS: ?Moderate medial and mild lateral compartment joint space narrowing ?with moderate peripheral degenerative osteophytes. Severe ?patellofemoral joint space narrowing with bone-on-bone contact and ?large superior and lateral patellar and large lateral trochlear ?degenerative osteophytes. Small joint effusion. No acute fracture is ?seen. No dislocation. ?  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 ?  ?  ?Electronically Signed ?  By: Yvonne Kendall M.D. ?  On: 11/29/2021 11:38 ?  ?I, Lynne Leader, personally (independently) visualized and performed the interpretation of the images attached in this note. ? ? ? ? ? ? ?Assessment and Plan: ?71 y.o. female with left lateral leg pain.  Pain is concentrated  around the left knee but extends proximally a little distally.  She is not convinced that her pain is due to the knee but knee pain is the most likely explanation.  Plan for steroid injection today.  If not much benefit we can look elsewhere including potential lumbar radiculopathy.  Recheck back 1 month ? ? ?PDMP not reviewed this encounter. ?Orders  Placed This Encounter  ?Procedures  ? [place] LIMITED JOINT SPACE STRUCTURES LOW LEFT(NO LINKED CHARGES)  ?  Order Specific Question:   Reason for Exam (SYMPTOM  OR DIAGNOSIS REQUIRED)  ?  Answer:   L knee pain  ?  Order Specific Question:   Preferred imaging location?  ?  Answer:   [place]  ? ?No orders of the defined types were placed in this encounter. ? ? ? ?Discussed warning signs or symptoms. Please see discharge instructions. Patient expresses understanding. ? ? ?The above documentation has been reviewed and is accurate and complete Lynne Leader, M.D. ? ? ?

## 2021-12-12 NOTE — Patient Instructions (Addendum)
Good to see you today. ? ?You had a L knee injection.  Call or go to the ER if you develop a large red swollen joint with extreme pain or oozing puss.  ? ?Follow-up: 1 month ?

## 2022-01-09 ENCOUNTER — Encounter: Payer: Self-pay | Admitting: Family Medicine

## 2022-01-09 ENCOUNTER — Ambulatory Visit: Payer: Medicare Other | Admitting: Family Medicine

## 2022-01-09 VITALS — BP 130/80 | HR 65 | Ht 62.0 in | Wt 195.2 lb

## 2022-01-09 DIAGNOSIS — G8929 Other chronic pain: Secondary | ICD-10-CM | POA: Diagnosis not present

## 2022-01-09 DIAGNOSIS — M25562 Pain in left knee: Secondary | ICD-10-CM | POA: Diagnosis not present

## 2022-01-09 DIAGNOSIS — M25531 Pain in right wrist: Secondary | ICD-10-CM

## 2022-01-09 NOTE — Patient Instructions (Addendum)
Good to see you today.  Glad you're feeling better.  Follow-up as needed.  We can do another cortisone shot July 27 or later. If it is sooner we will do gel shots. Let me know.   [place]!

## 2022-01-09 NOTE — Progress Notes (Signed)
   I, Wendy Poet, LAT, ATC, am serving as scribe for Dr. Lynne Leader.  Anyelin Mogle is a 71 y.o. female who presents to [place] at [facility] today for f/u of R wrist pain since 11/18/21 when she was picking up her dog and felt immediate pain in her R wrist at the base of the [address] [facility] [address] [facility] and into her forearm. Pt was seen at the [facility] on 11/19/21 for this issue. She is also f/u on L knee pain. She was last seen by Dr. Georgina Snell on 12/12/21 and was given a L knee steroid injection. Today, pt reports that both her R wrist and L knee are feeling better.  She states that the injection really helped her knee pain.  Dx imaging: 11/28/21 L knee and R wrist XR 11/19/21 R wrist XR             08/26/17 R hand XR  Pertinent review of systems: No fevers or chills  Relevant historical information: Hypertension and COPD   Exam:  BP 130/80 (BP Location: Right Arm, Patient Position: Sitting, Cuff Size: Normal)   Pulse 65   Ht '5\' 2"'$  (1.575 m)   Wt 195 lb 3.2 oz (88.5 kg)   SpO2 95%   BMI 35.70 kg/m  General: Well Developed, well nourished, and in no acute distress.   MSK: Left knee normal.  Normal motion with crepitation. Right wrist nontender.    Lab and Radiology Results  EXAM: LEFT KNEE 3 VIEWS   COMPARISON:  Left tibia and fibula radiographs 11/10/2017   FINDINGS: Moderate medial and mild lateral compartment joint space narrowing with moderate peripheral degenerative osteophytes. Severe patellofemoral joint space narrowing with bone-on-bone contact and large superior and lateral patellar and large lateral trochlear degenerative osteophytes. Small joint effusion. No acute fracture is seen. No dislocation.   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     Electronically Signed   By: Yvonne Kendall M.D.   On: 11/29/2021 11:38   I, Lynne Leader, personally (independently) visualized and performed the  interpretation of the images attached in this note.      Assessment and Plan: 71 y.o. female with left knee pain due to DJD.  Doing well with cortisone injection 1 month ago. We discussed treatment plan.  Can repeat steroid injection every 3 months.  If her reduction in pain benefit lasts longer than 3 months we will repeat steroid injection if it does not she will let me know and we can work on authorization of ironic acid injections.  Overall she is happy with how things are going.  Wrist pain doing well watchful waiting.  Recheck as needed  Total encounter time 20 minutes including face-to-face time with the patient and, reviewing past medical record, and charting on the date of service.   Plan and options  Discussed warning signs or symptoms. Please see discharge instructions. Patient expresses understanding.   The above documentation has been reviewed and is accurate and complete Lynne Leader, M.D.

## 2022-02-13 ENCOUNTER — Ambulatory Visit: Payer: Medicare Other | Admitting: Internal Medicine

## 2022-03-21 ENCOUNTER — Encounter: Payer: Self-pay | Admitting: Emergency Medicine

## 2022-03-21 ENCOUNTER — Ambulatory Visit
Admission: EM | Admit: 2022-03-21 | Discharge: 2022-03-21 | Disposition: A | Discharge status: Home / Self Care | Payer: Medicare Other | Attending: Family Medicine | Admitting: Family Medicine

## 2022-03-21 DIAGNOSIS — I10 Essential (primary) hypertension: Secondary | ICD-10-CM | POA: Diagnosis not present

## 2022-03-21 DIAGNOSIS — R609 Edema, unspecified: Secondary | ICD-10-CM | POA: Diagnosis not present

## 2022-03-21 DIAGNOSIS — B354 Tinea corporis: Secondary | ICD-10-CM

## 2022-03-21 DIAGNOSIS — R140 Abdominal distension (gaseous): Secondary | ICD-10-CM

## 2022-03-21 LAB — POCT URINALYSIS DIP (MANUAL ENTRY)
Bilirubin, UA: NEGATIVE
Glucose, UA: NEGATIVE mg/dL
Ketones, POC UA: NEGATIVE mg/dL
Leukocytes, UA: NEGATIVE
Nitrite, UA: NEGATIVE
Protein Ur, POC: NEGATIVE mg/dL
Spec Grav, UA: 1.025 (ref 1.010–1.025)
Urobilinogen, UA: 0.2 E.U./dL
pH, UA: 5.5 (ref 5.0–8.0)

## 2022-03-21 MED ORDER — INDAPAMIDE 1.25 MG PO TABS: ORAL_TABLET | ORAL | 1 refills | Status: AC

## 2022-03-21 MED ORDER — KETOCONAZOLE 2 % EX CREA: 1.0000 | TOPICAL_CREAM | Freq: Every day | CUTANEOUS | 0 refills | Status: AC

## 2022-03-21 NOTE — ED Notes (Signed)
Pt requested a medicine or cream for the redness on her abdomen - Dr Assunta Found had discussed w/ pt - Dr Assunta Found will send an e-script for pt

## 2022-03-21 NOTE — ED Notes (Signed)
See future order for stool sample for ova & parasites only

## 2022-03-21 NOTE — ED Provider Notes (Signed)
[name]    CSN: [id number] Arrival date & time: 03/21/22  1319      History   Chief Complaint Chief Complaint  Patient presents with   Leg Swelling    HPI Shelley Perez is a 71 y.o. female.   Patient presents with 3 complaints: 1)  She complains of increased non-painful swelling in both lower legs and feet for about a month, despite wearing support hose.  The swelling is worse at night.  She presently takes Lozol 1.'25mg'$  daily. 2)  She complains of mildly pruritic rash on her lower abdomen. 3)  She is concerned that she may have worms.  She states that she sleeps with her dog which she discovered may have pinworms.  She denies any changes in her bowel movements.  She feels well otherwise.  She states that she has an appointment with her PCP in about two weeks.  The history is provided by the patient.    Past Medical History:  Diagnosis Date   Anemia    Anxiety    Arthritis    Asthma    Cataract    Complication of anesthesia    "they gave me too much and I had to stay 3 days " 2005 after choley    COPD (chronic obstructive pulmonary disease) (HCC)    Depression    Fibromyalgia    GERD (gastroesophageal reflux disease)    Hematuria    History of kidney stones    Hx of pancreatitis    with gallstones   Hyperlipidemia    Hypertension    Insomnia    Obesity    Osteoporosis    Overactive bladder    Thrombocythemia    Vitamin D deficiency     Patient Active Problem List   Diagnosis Date Noted   Statin intolerance 02/22/2020   Visit for screening mammogram 02/15/2020   Bilateral hearing loss 02/15/2020   Essential hypertension 05/25/2019   Hyperglycemia 02/22/2019   OAB (overactive bladder) 02/22/2019   COPD GOLD 0/ AB 09/15/2018   Mastodynia of right breast 04/28/2018   Asthma 04/09/2018   Allergic rhinitis 04/09/2018   Routine general medical examination at a health care facility 02/17/2018   Atherosclerosis of aorta ([place]) 02/16/2018    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   Urinary anomaly 04/14/2017   Chronic idiopathic constipation 01/21/2017   Hyperlipidemia LDL goal <130 01/21/2017   Thrombocytosis 01/21/2017   Gastroesophageal reflux disease with esophagitis 01/29/2016   Osteoporosis 01/29/2016   Vitamin D deficiency 01/29/2016   Menopause ovarian failure 01/29/2016   Upper airway cough syndrome 12/12/2015   Abnormal CT of the chest 11/22/2015   COPD with acute exacerbation ([place]) 11/21/2015   Class 2 obesity with body mass index (BMI) of 36.0 to 36.9 in adult 07/11/2015   Depression with anxiety 04/19/2011    Past Surgical History:  Procedure Laterality Date   c sections     [place] / [place] Right 07/10/2015   Procedure: RIGHT TOTAL KNEE ARTHROPLASTY;  Surgeon: Paralee Cancel, MD;  Location: [facility];  Service: Orthopedics;  Laterality: Right;    OB History   No obstetric history on file.      Home Medications    Prior to Admission medications   Medication Sig Start Date End Date Taking? Authorizing Provider  albuterol (PROAIR  HFA) 108 (90 Base) MCG/ACT inhaler 2 puffs every 4 hours as needed only  if your can't catch your breath 11/12/21  Yes Tanda Rockers, MD  Ascorbic Acid (VITAMIN C PO) Take 1,000 mg by mouth daily.    Yes [provider]  ASPIRIN LOW DOSE 81 MG EC tablet TAKE 1 TABLET(81 MG) BY MOUTH DAILY 08/19/20  Yes Janith Lima, MD  budesonide-formoterol [facility]) 80-4.5 MCG/ACT inhaler Take 2 puffs first thing in am and then another 2 puffs about 12 hours later. 09/24/21  Yes Tanda Rockers, MD  celecoxib (CELEBREX) 100 MG capsule TAKE 1 CAPSULE(100 MG) BY MOUTH TWICE DAILY 05/16/20  Yes Janith Lima, MD  Cholecalciferol (VITAMIN D) 50 MCG (2000 UT) CAPS Take 1 capsule by mouth daily.   Yes [provider]  dextromethorphan-guaiFENesin (MUCINEX DM)  30-600 MG 12hr tablet Take 1 tablet by mouth 2 (two) times daily as needed for cough.   Yes [provider]  famotidine (PEPCID) 20 MG tablet One after supper 09/24/21  Yes Tanda Rockers, MD  ipratropium-albuterol (DUONEB) 0.5-2.5 (3) MG/3ML SOLN Up to one every 4 hours as needed 05/29/20  Yes Tanda Rockers, MD  irbesartan (AVAPRO) 150 MG tablet TAKE 1 TABLET(150 MG) BY MOUTH DAILY 08/20/20  Yes Janith Lima, MD  ketoconazole (NIZORAL) 2 % cream Apply 1 Application topically daily. 03/21/22  Yes Kandra Nicolas, MD  Misc. Devices (ACAPELLA) MISC Use as needed.   Yes [provider]  potassium chloride (KLOR-CON) 10 MEQ tablet Take 1 tablet (10 mEq total) by mouth daily. 07/10/20  Yes Janith Lima, MD  indapamide (LOZOL) 1.25 MG tablet Take two tabs PO QAM 03/21/22   Kandra Nicolas, MD    Family History Family History  Problem Relation Age of Onset   Hyperlipidemia Mother    Hypertension Mother    Heart attack Mother    Heart disease Mother    Cancer Father    Asthma Sister    Lung cancer Sister        smoked   Colon cancer Neg Hx     Social History Social History   Tobacco Use   Smoking status: Former    Packs/day: 1.00    Years: 40.00    Total pack years: 40.00    Types: Cigarettes    Quit date: 06/28/2013    Years since quitting: 8.7   Smokeless tobacco: Never  Vaping Use   Vaping Use: Never used  Substance Use Topics   Alcohol use: No    Alcohol/week: 0.0 standard drinks of alcohol   Drug use: No     Allergies   Linzess [linaclotide], Myrbetriq [mirabegron], Rosuvastatin calcium, Atorvastatin, Codeine, Crestor [rosuvastatin], Prozac [fluoxetine hcl], Latex, Sulfa antibiotics, and Tramadol   Review of Systems Review of Systems  Constitutional:  Negative for activity change, appetite change, chills, diaphoresis, fatigue and fever.  HENT: Negative.    Eyes: Negative.   Respiratory: Negative.    Cardiovascular:  Positive for leg swelling.  Negative for chest pain and palpitations.       No lower leg/calf pain.  Gastrointestinal:        Abdominal bloating at times.  Endocrine: Negative.   Genitourinary: Negative.   Musculoskeletal: Negative.   Skin: Negative.   Neurological: Negative.   Hematological:  Negative for adenopathy.     Physical Exam Triage Vital Signs ED Triage Vitals  Enc Vitals Group     BP 03/21/22 1405 Marland Kitchen)  177/74     Pulse Rate 03/21/22 1405 60     Resp 03/21/22 1405 18     Temp 03/21/22 1405 99.5 F (37.5 C)     Temp Source 03/21/22 1405 Oral     SpO2 03/21/22 1405 98 %     Weight --      Height 03/21/22 1407 '5\' 1"'$  (1.549 m)     Head Circumference --      Peak Flow --      Pain Score 03/21/22 1406 7     Pain Loc --      Pain Edu? --      Excl. in [place]? --    No data found.  Updated Vital Signs BP (!) 177/74 (BP Location: Right Arm)   Pulse 60   Temp 99.5 F (37.5 C) (Oral)   Resp 18   Ht '5\' 1"'$  (1.549 m)   SpO2 98%   BMI 36.88 kg/m   Visual Acuity Right Eye Distance:   Left Eye Distance:   Bilateral Distance:    Right Eye Near:   Left Eye Near:    Bilateral Near:     Physical Exam Vitals and nursing note reviewed.  Constitutional:      General: She is not in acute distress.    Appearance: She is obese. She is not ill-appearing.  HENT:     Head: Normocephalic.     Mouth/Throat:     Mouth: Mucous membranes are moist.  Eyes:     Conjunctiva/sclera: Conjunctivae normal.     Pupils: Pupils are equal, round, and reactive to light.  Cardiovascular:     Rate and Rhythm: Normal rate and regular rhythm.     Heart sounds: Normal heart sounds.  Pulmonary:     Breath sounds: Normal breath sounds.  Abdominal:     Palpations: Abdomen is soft.     Tenderness: There is no abdominal tenderness.       Comments: Lower abdomen has a mildly erythematous macular eruption with well defined borders in intertriginous area as noted on diagram.  No tenderness to palpation.     Musculoskeletal:        General: No swelling or tenderness.     Cervical back: Neck supple.     Right lower leg: Edema present.     Left lower leg: Edema present.     Comments: No lower leg/calf tenderness to palpation.  Lymphadenopathy:     Cervical: No cervical adenopathy.  Skin:    General: Skin is warm and dry.     Findings: No rash.  Neurological:     Mental Status: She is alert.      UC Treatments / Results  Labs (all labs ordered are listed, but only abnormal results are displayed) Labs Reviewed  CBC WITH DIFFERENTIAL/PLATELET - Abnormal; Notable for the following components:      Result Value   Platelets 476 (*)    All other components within normal limits  POCT URINALYSIS DIP (MANUAL ENTRY) - Abnormal; Notable for the following components:   Color, UA other (*)    Blood, UA moderate (*)    All other components within normal limits  COMPLETE METABOLIC PANEL WITH GFR    EKG   Radiology No results found.  Procedures Procedures (including critical care time)  Medications Ordered in UC Medications - No data to display  Initial Impression / Assessment and Plan / UC Course  I have reviewed the triage vital signs and the nursing notes.  Pertinent labs & imaging results that were available during my care of the patient were reviewed by me and considered in my medical decision making (see chart for details).    CBC (check for eosinophilia), CMP pending. Because patient has been in close contact with her dog which has worms, will check stool for O and P exam. Note decreased control of BP today. Increase indapamide to 2.'5mg'$  daily. Begin ketoconazole cream to intertriginous area on lower abdomen. Followup with Family Doctor in approximately two weeks as scheduled.  Final Clinical Impressions(s) / UC Diagnoses   Final diagnoses:  Peripheral edema  Essential hypertension  Abdominal bloating  Tinea corporis     Discharge Instructions      Increase  indapamide 1.'25mg'$  to two tabs each morning.  Minimize salt intake. Return a stool specimen as soon as possible.     ED Prescriptions     Medication Sig Dispense Auth. Provider   indapamide (LOZOL) 1.25 MG tablet Take two tabs PO QAM 15 tablet Kandra Nicolas, MD   ketoconazole (NIZORAL) 2 % cream Apply 1 Application topically daily. 30 g Kandra Nicolas, MD         Kandra Nicolas, MD 03/23/22 [phone]

## 2022-03-21 NOTE — ED Triage Notes (Signed)
Patient c/o bilateral leg and feet swelling x 1 month.  Patient states it's worse at night.  Patient did contact her PCP unable to be seen until 03/31/22.

## 2022-03-21 NOTE — Discharge Instructions (Addendum)
Increase indapamide 1.'25mg'$  to two tabs each morning.  Minimize salt intake. Return a stool specimen as soon as possible.

## 2022-03-22 ENCOUNTER — Telehealth: Payer: Self-pay | Admitting: Emergency Medicine

## 2022-03-22 LAB — COMPLETE METABOLIC PANEL WITH GFR
AG Ratio: 1.5 (calc) (ref 1.0–2.5)
ALT: 17 U/L (ref 6–29)
AST: 19 U/L (ref 10–35)
Albumin: 4.1 g/dL (ref 3.6–5.1)
Alkaline phosphatase (APISO): 96 U/L (ref 37–153)
BUN: 17 mg/dL (ref 7–25)
CO2: 25 mmol/L (ref 20–32)
Calcium: 9.5 mg/dL (ref 8.6–10.4)
Chloride: 104 mmol/L (ref 98–110)
Creat: 0.72 mg/dL (ref 0.60–1.00)
Globulin: 2.7 g/dL (calc) (ref 1.9–3.7)
Glucose, Bld: 89 mg/dL (ref 65–99)
Potassium: 4.1 mmol/L (ref 3.5–5.3)
Sodium: 140 mmol/L (ref 135–146)
Total Bilirubin: 0.4 mg/dL (ref 0.2–1.2)
Total Protein: 6.8 g/dL (ref 6.1–8.1)
eGFR: 89 mL/min/{1.73_m2} (ref >=60)

## 2022-03-22 LAB — CBC WITH DIFFERENTIAL/PLATELET
Absolute Monocytes: 621 cells/uL (ref 200–950)
Basophils Absolute: 80 cells/uL (ref 0–200)
Basophils Relative: 1.1 %
Eosinophils Absolute: 248 cells/uL (ref 15–500)
Eosinophils Relative: 3.4 %
HCT: 35.4 % (ref 35.0–45.0)
Hemoglobin: 12.2 g/dL (ref 11.7–15.5)
Lymphs Abs: 2190 cells/uL (ref 850–3900)
MCH: 30 pg (ref 27.0–33.0)
MCHC: 34.5 g/dL (ref 32.0–36.0)
MCV: 87.2 fL (ref 80.0–100.0)
MPV: 9.8 fL (ref 7.5–12.5)
Monocytes Relative: 8.5 %
Neutro Abs: 4161 cells/uL (ref 1500–7800)
Neutrophils Relative %: 57 %
Platelets: 476 10*3/uL — ABNORMAL HIGH (ref 140–400)
RBC: 4.06 10*6/uL (ref 3.80–5.10)
RDW: 12 % (ref 11.0–15.0)
Total Lymphocyte: 30 %
WBC: 7.3 10*3/uL (ref 3.8–10.8)

## 2022-03-22 NOTE — Telephone Encounter (Signed)
Call to see how Phyllls was doing today - Labs reviewed by Dr Meda Coffee - within normal range for pt. Unable to leave a message - call dropped.

## 2022-03-26 ENCOUNTER — Telehealth: Payer: Self-pay | Admitting: Emergency Medicine

## 2022-03-26 NOTE — Telephone Encounter (Signed)
Attempted to contact patient regarding her stool specimen.  Patient will need to recollect another specimen due to it not being enough in the container.  Patient can stop by on 03/27/2022 in the afternoon to pick up the appropriate containers for collection.  Dr Assunta Found will replace the orders for future collection.

## 2022-03-27 ENCOUNTER — Telehealth: Payer: Self-pay | Admitting: Emergency Medicine

## 2022-04-02 ENCOUNTER — Telehealth: Payer: Self-pay | Admitting: Emergency Medicine

## 2022-04-02 DIAGNOSIS — R140 Abdominal distension (gaseous): Secondary | ICD-10-CM

## 2022-04-02 NOTE — Telephone Encounter (Signed)
Bria returns today to pick up another collection device for the Ova & Parasite. Per previous phone note, the prior sample was not enough & a recollect was needed. Previous lab was [facility]. This sample will be sent to [facility] microbiology lab once collected. Pt given instructions for collection & a specimen hat to collect sample. Pt given 1 week to return sample to [facility] for processing. No other questions at this time.

## 2022-04-08 ENCOUNTER — Other Ambulatory Visit: Payer: Self-pay | Admitting: Emergency Medicine

## 2022-04-08 DIAGNOSIS — R140 Abdominal distension (gaseous): Secondary | ICD-10-CM

## 2022-04-09 ENCOUNTER — Other Ambulatory Visit: Payer: Self-pay | Admitting: Family Medicine

## 2022-04-09 ENCOUNTER — Telehealth: Payer: Self-pay | Admitting: Emergency Medicine

## 2022-04-09 DIAGNOSIS — R140 Abdominal distension (gaseous): Secondary | ICD-10-CM

## 2022-04-09 NOTE — Telephone Encounter (Addendum)
Call from [facility], unable to locate order for Ova & Parasite test. Sample sent to lab on 04/08/22. Reordered per  Dr Assunta Found. Order is auto canceled when RN attempts to reorder. RN reviewed chart w/ provider here today. RN unable to reach [place] at this time. Patient directed to follow up with PCP at this time or the ED if she is having continued abdominal bloating. RN will attempt to reach [place] later on today. Follow up call to [facility] lab to see if requisition had gone through. Awaiting a call back from the lab to see if sample can be  processed. This RN will call [place] back after lab confirms sample can be processed. Call back from lab- sample is in process per lab. This RN called [place] - no answer - unable to leave a message. Results should be available in 72 hours.

## 2022-04-09 NOTE — Addendum Note (Signed)
Addended by: Page Spiro on: 04/09/2022 08:57 AM   Modules accepted: Orders

## 2022-04-11 ENCOUNTER — Other Ambulatory Visit: Payer: Self-pay | Admitting: Family Medicine

## 2022-04-11 LAB — O&P RESULT

## 2022-04-11 LAB — OVA + PARASITE EXAM

## 2022-10-17 ENCOUNTER — Ambulatory Visit: Payer: Self-pay

## 2022-10-17 ENCOUNTER — Ambulatory Visit: Payer: Medicare Other | Admitting: Family Medicine

## 2022-10-17 ENCOUNTER — Encounter: Payer: Self-pay | Admitting: Family Medicine

## 2022-10-17 VITALS — BP 160/82 | HR 88 | Ht 61.0 in | Wt 188.6 lb

## 2022-10-17 DIAGNOSIS — M79672 Pain in left foot: Secondary | ICD-10-CM

## 2022-10-17 NOTE — Patient Instructions (Addendum)
Thank you for coming in today.   Call or go to the ER if you develop a large red swollen joint with extreme pain or oozing puss.    Use a short cam walker boot.  Please go to [facility] supply to get the cam walker we talked about today. You may also be able to get it from [employer].    Use the boot about about 1-2 weeks as needed.   Recheck in 1 month.   If the boot is worse then stop it and let me know.   Ok to come back sooner.

## 2022-10-17 NOTE — Progress Notes (Signed)
I, Peterson Lombard, LAT, ATC acting as a scribe for Lynne Leader, MD.  Shelley Perez is a 72 y.o. female who presents to [place] at [facility] today for L heel pain. Pt was previously seen by Dr. Georgina Snell on 01/09/22 for L knee and R wrist pain. Today, pt c/o L heel pain x 10 months, sx started shortly after last injection. Pt locates pain to medial aspect of the left foot radiating into the ankle and arch of the foot. Some swelling. Sx worse with ambulation. The area is TTP. MOI unknown.   Aggravates: ambulation, first morning steps.  Treatments tried: Magnesium has helped with swelling. Heat/ice  Dx imaging: 10/03/22 L foot XR (done @ [facility]) 06/05/16 L foot XR  Pertinent review of systems: No fevers or chills.  Relevant historical information: COPD   Exam:  BP (!) 160/82   Pulse 88   Ht '5\' 1"'$  (1.549 m)   Wt 188 lb 9.6 oz (85.5 kg)   SpO2 96%   BMI 35.64 kg/m  General: Well Developed, well nourished, and in no acute distress.   MSK: Left foot: Normal-appearing Tender palpation dorsal medial midfoot at first tarsometatarsal joint area and at the talar navicular joint area.  Normal foot and ankle motion.  No pain with resisted foot dorsiflexion and eversion.  Palpation overlying the posterior tibialis tendon and the tibialis anterior tendon is nontender.    Lab and Radiology Results  Procedure: Real-time Ultrasound Guided Injection of left foot talur navicular joint Device: Philips Affiniti 50G Images permanently stored and available for review in PACS Ultrasound evaluation of the painful area of the midfoot reveals a joint effusion and tenderness to palpation with ultrasound probe of the talur navicular joint and medial midfoot revealed joint effusion at the joint between the talus and navicular bone. Verbal informed consent obtained.  Discussed risks and benefits of procedure. Warned about infection, bleeding, hyperglycemia damage to structures among  others. Patient expresses understanding and agreement Time-out conducted.   Noted no overlying erythema, induration, or other signs of local infection.   Skin prepped in a sterile fashion.   Local anesthesia: Topical Ethyl chloride.   With sterile technique and under real time ultrasound guidance: 40 mg of Kenalog and 1 mL of lidocaine injected into joint capsule. Fluid seen entering the joint.   Completed without difficulty   Pain moderately resolved suggesting accurate placement of the medication.   Advised to call if fevers/chills, erythema, induration, drainage, or persistent bleeding.   Images permanently stored and available for review in the ultrasound unit.  Impression: Technically successful ultrasound guided injection.       Impression Performed by [facility] CONVERSION  1.  No acute fracture or malalignment. 2.  Joint spaces are maintained. 3.  Chronic calcaneal enthesopathy. Narrative Performed by [facility] LEFT FOOT (3+ VIEWS), 10/03/2022 11:45 AM  INDICATION: left foot pain \ M79.672 Left foot pain COMPARISON: None. Procedure Note  Dewaine Oats, MD - 10/15/2022 Formatting of this note might be different from the original. X-RAY LEFT FOOT (3+ VIEWS), 10/03/2022 11:45 AM  INDICATION: left foot pain \ M79.672 Left foot pain COMPARISON: None.  IMPRESSION:  1.  No acute fracture or malalignment. 2.  Joint spaces are maintained. 3.  Chronic calcaneal enthesopathy. Specimen Collected: --      Assessment and Plan: 72 y.o. female with left midfoot pain.  Pain thought to be due to midfoot degenerative changes.  I cannot see the x-ray images  from [facility] that were obtained last week.  No fractures reported but they also they do not mention any arthritis.  I do think there are some arthritis changes on ultrasound which I suspect is probably the main issue here.  Plan for arch support and steroid injection into the midfoot at the  area of maximum tenderness at the talus navicular joint.  CAM Walker boot temporarily for a week or 2. Recheck in a month.  PDMP not reviewed this encounter. Orders Placed This Encounter  Procedures   [place] LIMITED JOINT SPACE STRUCTURES LOW LEFT(NO LINKED CHARGES)    Order Specific Question:   Reason for Exam (SYMPTOM  OR DIAGNOSIS REQUIRED)    Answer:   left foot pain    Order Specific Question:   Preferred imaging location?    Answer:   [facility]   No orders of the defined types were placed in this encounter.    Discussed warning signs or symptoms. Please see discharge instructions. Patient expresses understanding.   The above documentation has been reviewed and is accurate and complete Lynne Leader, M.D.

## 2022-11-21 ENCOUNTER — Ambulatory Visit: Payer: Medicare Other | Admitting: Family Medicine

## 2022-11-25 ENCOUNTER — Ambulatory Visit: Payer: Medicare Other | Admitting: Family Medicine

## 2022-11-25 VITALS — BP 144/86 | HR 76 | Ht 61.0 in | Wt 185.0 lb

## 2022-11-25 DIAGNOSIS — M79672 Pain in left foot: Secondary | ICD-10-CM

## 2022-11-25 NOTE — Progress Notes (Signed)
   Rubin Payor, PhD, LAT, ATC acting as a scribe for Clementeen Graham, MD.  Shelley Perez is a 72 y.o. female who presents to [facility] at [facility] today for 1 month follow-up left foot pain thought to be due to midfoot degenerative changes.  Patient was last seen by Dr. Denyse Amass on 10/17/2022 and was given a left talar navicular steroid injection and was advised to use a CAM walker boot for 1-2 weeks, and use good arch support.  Today, patient reports L foot is feeling pretty good. Pt wore the boot for about a week, but it was difficulty because she is on her feet a lot. Pt is able to walk.   Dx imaging: 10/03/22 L foot XR (done @ [facility]) 06/05/16 L foot XR  Pertinent review of systems: No fevers or chills  Relevant historical information: Hypertension, COPD   Exam:  BP (!) 144/86   Pulse 76   Ht 5\' 1"  (1.549 m)   Wt 185 lb (83.9 kg)   SpO2 99%   BMI 34.96 kg/m  General: Well Developed, well nourished, and in no acute distress.   MSK: Left foot nontender normal motion     Assessment and Plan: 72 y.o. female with left foot pain thought to be due to midfoot DJD.  Significant improvement with talonavicular injection occurring about a month ago.  Plan to resume activity as tolerated and check back as needed.  Can repeat this exact same injection every 3 months which would be early June.   PDMP not reviewed this encounter. No orders of the defined types were placed in this encounter.  No orders of the defined types were placed in this encounter.    Discussed warning signs or symptoms. Please see discharge instructions. Patient expresses understanding.   The above documentation has been reviewed and is accurate and complete Clementeen Graham, M.D.

## 2022-11-25 NOTE — Patient Instructions (Signed)
Thank you for coming in today.   I can repeat the injection every 3 months as needed (early June)  Ok to continue normal activity as tolerated.   Let me know if you have any problems.
